# Patient Record
Sex: Male | Born: 1975
Health system: Southern US, Community
[De-identification: ages and names within clinical notes are randomized; demographics above are authoritative.]

## PROBLEM LIST (undated history)

## (undated) DIAGNOSIS — R079 Chest pain, unspecified: Secondary | ICD-10-CM

## (undated) DIAGNOSIS — I4891 Unspecified atrial fibrillation: Secondary | ICD-10-CM

## (undated) DIAGNOSIS — E785 Hyperlipidemia, unspecified: Secondary | ICD-10-CM

## (undated) DIAGNOSIS — N183 Chronic kidney disease, stage 3 unspecified: Secondary | ICD-10-CM

## (undated) HISTORY — PX: TONSILLECTOMY: SUR1361

## (undated) HISTORY — DX: Hyperlipidemia, unspecified: E78.5

## (undated) HISTORY — PX: APPENDECTOMY: SHX54

## (undated) HISTORY — DX: Chest pain, unspecified: R07.9

## (undated) HISTORY — DX: Chronic kidney disease, stage 3 unspecified: N18.30

## (undated) HISTORY — DX: Unspecified atrial fibrillation: I48.91

---

## 2006-11-12 ENCOUNTER — Inpatient Hospital Stay (HOSPITAL_COMMUNITY): Admission: EM | Admit: 2006-11-12 | Discharge: 2006-11-14 | Payer: Self-pay | Admitting: Emergency Medicine

## 2006-11-12 ENCOUNTER — Encounter (INDEPENDENT_AMBULATORY_CARE_PROVIDER_SITE_OTHER): Payer: Self-pay | Admitting: *Deleted

## 2009-11-25 ENCOUNTER — Emergency Department (HOSPITAL_COMMUNITY): Admission: EM | Admit: 2009-11-25 | Discharge: 2009-11-25 | Payer: Self-pay | Admitting: Emergency Medicine

## 2009-11-27 ENCOUNTER — Emergency Department (HOSPITAL_COMMUNITY): Admission: EM | Admit: 2009-11-27 | Discharge: 2009-11-27 | Payer: Self-pay | Admitting: Emergency Medicine

## 2011-02-04 NOTE — H&P (Signed)
NAMEJOVANI, [name]              ACCOUNT NO.:  [id number]   MEDICAL RECORD NO.:  [id number]          PATIENT TYPE:  EMS   LOCATION:  ED                           FACILITY:  [facility]   PHYSICIAN:  Ollen Gross. Vernell Morgans, M.D. DATE OF BIRTH:  1976/06/10   DATE OF ADMISSION:  11/12/2006  DATE OF DISCHARGE:                              HISTORY & PHYSICAL   HISTORY OF PRESENT ILLNESS:  [name] is a 35 year old black male who  presents to the emergency department with right lower quadrant abdominal  pain that started earlier this morning.  He has had one episode of  nausea and vomiting.  As the day has gone on he has not run any fevers.  His pain, though, has gotten worse, to the point where he came to the  emergency department for further evaluation.   REVIEW OF SYSTEMS:  His other review of systems is unremarkable.   PAST MEDICAL HISTORY:  None.   PAST SURGICAL HISTORY:  Significant for tonsillectomy as a child.   MEDICATIONS:  None.   ALLERGIES:  None.   SOCIAL HISTORY:  He smokes cigarettes but denies any alcohol or drug  use.   FAMILY HISTORY:  Noncontributory.   PHYSICAL EXAMINATION:  VITAL SIGNS:  Temperature is 97.9, blood pressure  128/67, pulse is 69.  GENERAL:  He is a young black male in no acute distress.  SKIN:  Warm and dry, no jaundice.  HEENT:  Eyes:  His extraocular muscles are intact.  Pupils equal, round,  and react to light.  Sclerae nonicteric.  LUNGS:  Clear bilaterally with no use of accessory respiratory muscles.  HEART:  Has a regular rate and rhythm with an impulse in the left chest.  ABDOMEN:  Soft with focal right lower quadrant tenderness but no  guarding or peritonitis.  No palpable mass or hepatosplenomegaly.  EXTREMITIES:  No cyanosis, clubbing, or edema with good strength in his  arms and legs.  NEUROLOGIC:  Psychologically he is alert and oriented x3 with no  evidence today of anxiety or depression.   LABORATORY DATA:  On review of his lab work  it was significant for a  white count of 13,000.   CT scan was reviewed with the radiologist and did show an enlarged,  inflamed appendix.   ASSESSMENT/PLAN:  This is a 35 year old black male with what appears to  be acute appendicitis.  Because of the risk of perforation and sepsis, I  think he would benefit from having his appendix removed tonight.  I have  explained to him and his family the risks and benefits of the operation  to remove the appendix as well as some of the technical aspects, and he  understands and wishes to proceed.  We will make arrangements to do this  for him this evening.      Ollen Gross. Vernell Morgans, M.D.  Electronically Signed     PST/MEDQ  D:  11/12/2006  T:  11/12/2006  Job:  [id number]

## 2011-02-04 NOTE — Op Note (Signed)
[name], [name]              ACCOUNT NO.:  [id number]   MEDICAL RECORD NO.:  [id number]          PATIENT TYPE:  OBV   LOCATION:  0098                         FACILITY:  [facility]   PHYSICIAN:  Ollen Gross. Vernell Morgans, M.D. DATE OF BIRTH:  Aug 04, 1976   DATE OF PROCEDURE:  11/12/2006  DATE OF DISCHARGE:                               OPERATIVE REPORT   PREOPERATIVE DIAGNOSIS:  Appendicitis.   POSTOPERATIVE DIAGNOSIS:  Appendicitis.   PROCEDURE:  Laparoscopic appendectomy.   SURGEON:  Ollen Gross. Vernell Morgans, M.D.   ANESTHESIA:  General endotracheal.   PROCEDURE:  After informed consent was obtained, the patient was brought  to the operating room, placed in supine position on the operating table.  After induction of general anesthesia, the patient's abdomen was prepped  with Betadine and draped in usual sterile manner.  The area below the  umbilicus was infiltrated with 4% Marcaine.  A small incision was made  with 15 blade knife.  This incision was carried down through the  subcutaneous tissue bluntly with a hemostat and [employer] retractors  until the linea alba was identified.  The linea alba was incised with a  15 blade knife and each side was grasped with Kocher clamps and elevated  anteriorly.  The preperitoneal space was then probed bluntly with a  hemostat until the peritoneum was opened and access was gained to the  abdominal cavity.  A 0 Vicryl pursestring stitch was placed in the  fascia around the opening.  A Hasson cannula was placed through the  opening and anchored in place with a previously placed Vicryl  pursestring stitch.  The abdomen was then insufflated with carbon  dioxide without difficulty.  The patient was placed in Trendelenburg  position and rotated with the right side up.  The suprapubic area was  then infiltrated with 0.25% Marcaine.  A small incision was made with 15  blade knife and a 10 mm port was placed bluntly through this incision  into the abdominal cavity  under direct vision.  A site was chosen  between these two ports for placement of a 5-mm port.  This area was  infiltrated with 0.25% Marcaine.  A small stab incision was made with 15  blade knife and 5 mm port was placed bluntly through this incision into  the abdominal cavity under direct vision.  The laparoscope was then  moved to the suprapubic port using a Glassman grasper and Harmonic  scalpel.  The right lower quadrant was inspected.  The appendix was  readily identified.  The appendix was then held up in the air towards  the anterior abdominal wall and the mesoappendix was taken down sharply  with the Harmonic scalpel.  Once this dissection was taken down to the  base of the appendix at its junction with the cecum.  A 6TB45 blue load  stapler was placed through the Hasson cannula and across the base the  appendix at its junction with the cecum, clamped and fired thereby  dividing the base of the appendix between staple lines.  A laparoscopic  bag was inserted through the [facility]  cannula, the appendix placed within  the bag and bag was sealed.  The staple line was examined and there was  one small little bleeding vessel on the staple line.  This was  controlled with a clip.  Once this was done the staple line was  hemostatic and healthy and intact.  The abdomen was then irrigated with  copious amounts of saline until the effluent was clear. The abdomen was  generally inspected.  No other abnormalities were noted.  The appendix  and bag were removed with the Hasson cannula through the infraumbilical  port without difficulty.  The fascial defect was closed with the  previously placed Vicryl pursestring stitch as well as with another  figure-of-eight interrupted 0-0 Vicryl stitch.  The rest of ports were  removed under direct vision.  The gas was allowed to escape.  All ports  were hemostatic.  The fascia of the suprapubic port was then closed with  a single interrupted 0-0 Vicryl  stitch.  The skin incisions were all  closed with interrupted 4-0 Monocryl subcuticular stitches.  Benzoin,  Steri-Strips and sterile dressings were applied.  The patient tolerated  well.  At the end of the case all needle, sponge instrument counts  correct.  The patient was awakened  and taken to recovery in stable  condition.      Ollen Gross. Vernell Morgans, M.D.  Electronically Signed     PST/MEDQ  D:  11/12/2006  T:  11/13/2006  Job:  [id number]

## 2011-02-07 ENCOUNTER — Emergency Department (HOSPITAL_COMMUNITY)
Admission: EM | Admit: 2011-02-07 | Discharge: 2011-02-07 | Disposition: A | Discharge status: Home / Self Care | Payer: Self-pay | Attending: Emergency Medicine | Admitting: Emergency Medicine

## 2011-02-07 DIAGNOSIS — R42 Dizziness and giddiness: Secondary | ICD-10-CM | POA: Insufficient documentation

## 2011-02-07 DIAGNOSIS — R5383 Other fatigue: Secondary | ICD-10-CM | POA: Insufficient documentation

## 2011-02-07 DIAGNOSIS — R5381 Other malaise: Secondary | ICD-10-CM | POA: Insufficient documentation

## 2011-02-07 DIAGNOSIS — E119 Type 2 diabetes mellitus without complications: Secondary | ICD-10-CM | POA: Insufficient documentation

## 2011-02-07 LAB — CBC
Hemoglobin: 13.9 g/dL (ref 13.0–17.0)
MCH: 24.1 pg — ABNORMAL LOW (ref 26.0–34.0)
Platelets: 198 10*3/uL (ref 150–400)
RBC: 5.77 MIL/uL (ref 4.22–5.81)
RDW: 12.9 % (ref 11.5–15.5)
WBC: 8.8 10*3/uL (ref 4.0–10.5)

## 2011-02-07 LAB — BASIC METABOLIC PANEL
BUN: 15 mg/dL (ref 6–23)
CO2: 22 mEq/L (ref 19–32)
Calcium: 9.1 mg/dL (ref 8.4–10.5)
GFR calc non Af Amer: >60 mL/min (ref >60)
Glucose, Bld: 440 mg/dL — ABNORMAL HIGH (ref 70–99)
Sodium: 129 mEq/L — ABNORMAL LOW (ref 135–145)

## 2011-02-07 LAB — PROTIME-INR
INR: 0.93 (ref 0.00–1.49)
Prothrombin Time: 12.7 seconds (ref 11.6–15.2)

## 2011-02-07 LAB — DIFFERENTIAL
Basophils Relative: 1 % (ref 0–1)
Eosinophils Absolute: 0.1 10*3/uL (ref 0.0–0.7)
Eosinophils Relative: 1 % (ref 0–5)
Lymphs Abs: 2.2 10*3/uL (ref 0.7–4.0)

## 2011-02-07 LAB — URINALYSIS, ROUTINE W REFLEX MICROSCOPIC
Glucose, UA: >1000 mg/dL — AB
Hgb urine dipstick: NEGATIVE
Ketones, ur: 40 mg/dL — AB
Nitrite: NEGATIVE
Specific Gravity, Urine: 1.043 — ABNORMAL HIGH (ref 1.005–1.030)

## 2011-02-07 LAB — BLOOD GAS, VENOUS
Bicarbonate: 22.3 mEq/L (ref 20.0–24.0)
Patient temperature: 98.6
TCO2: 20.2 mmol/L (ref 0–100)

## 2011-02-07 LAB — CK TOTAL AND CKMB (NOT AT ARMC)
Relative Index: INVALID (ref 0.0–2.5)
Total CK: 76 U/L (ref 7–232)

## 2011-02-12 ENCOUNTER — Encounter: Payer: Self-pay | Admitting: Internal Medicine

## 2011-02-12 ENCOUNTER — Inpatient Hospital Stay (HOSPITAL_COMMUNITY)
Admission: EM | Admit: 2011-02-12 | Discharge: 2011-02-13 | DRG: 639 | Disposition: A | Discharge status: Home / Self Care | Payer: Self-pay | Attending: Internal Medicine | Admitting: Internal Medicine

## 2011-02-12 DIAGNOSIS — E1169 Type 2 diabetes mellitus with other specified complication: Secondary | ICD-10-CM

## 2011-02-12 DIAGNOSIS — E119 Type 2 diabetes mellitus without complications: Principal | ICD-10-CM | POA: Diagnosis present

## 2011-02-12 LAB — DIFFERENTIAL
Basophils Absolute: 0 10*3/uL (ref 0.0–0.1)
Basophils Relative: 1 % (ref 0–1)
Eosinophils Absolute: 0 10*3/uL (ref 0.0–0.7)
Monocytes Absolute: 0.3 10*3/uL (ref 0.1–1.0)
Monocytes Relative: 4 % (ref 3–12)
Neutro Abs: 5.6 10*3/uL (ref 1.7–7.7)
Neutrophils Relative %: 72 % (ref 43–77)

## 2011-02-12 LAB — COMPREHENSIVE METABOLIC PANEL
Alkaline Phosphatase: 96 U/L (ref 39–117)
CO2: 30 mEq/L (ref 19–32)
Calcium: 10.2 mg/dL (ref 8.4–10.5)
Chloride: 89 mEq/L — ABNORMAL LOW (ref 96–112)
Creatinine, Ser: 1.05 mg/dL (ref 0.4–1.5)
GFR calc Af Amer: >60 mL/min (ref >60)
GFR calc non Af Amer: >60 mL/min (ref >60)
Potassium: 4.7 mEq/L (ref 3.5–5.1)
Sodium: 130 mEq/L — ABNORMAL LOW (ref 135–145)
Total Protein: 7.1 g/dL (ref 6.0–8.3)

## 2011-02-12 LAB — URINE MICROSCOPIC-ADD ON

## 2011-02-12 LAB — GLUCOSE, CAPILLARY: Glucose-Capillary: 358 mg/dL — ABNORMAL HIGH (ref 70–99)

## 2011-02-12 LAB — BASIC METABOLIC PANEL
Creatinine, Ser: 1 mg/dL (ref 0.4–1.5)
GFR calc Af Amer: >60 mL/min (ref >60)
Potassium: 3.7 mEq/L (ref 3.5–5.1)
Sodium: 135 mEq/L (ref 135–145)

## 2011-02-12 LAB — CBC
MCH: 24.7 pg — ABNORMAL LOW (ref 26.0–34.0)
MCHC: 33.3 g/dL (ref 30.0–36.0)
Platelets: 191 10*3/uL (ref 150–400)
RBC: 5.27 MIL/uL (ref 4.22–5.81)

## 2011-02-12 LAB — URINALYSIS, ROUTINE W REFLEX MICROSCOPIC
Hgb urine dipstick: NEGATIVE
Nitrite: NEGATIVE
Specific Gravity, Urine: 1.04 — ABNORMAL HIGH (ref 1.005–1.030)
Urobilinogen, UA: 0.2 mg/dL (ref 0.0–1.0)
pH: 6 (ref 5.0–8.0)

## 2011-02-12 LAB — LIPASE, BLOOD: Lipase: 52 U/L (ref 11–59)

## 2011-02-12 NOTE — Progress Notes (Signed)
Hospital Admission Note Date: 02/12/2011  Patient name: [name] Medical record number: [id number] Date of birth: 1976/06/18 Age: 35 y.o. Gender: male PCP: No primary provider on file.  Medical Service: IMTS  Attending physician:Dr. Blanch Media  Pager: Resident (R2/R3):Dr. Baltazar Apo         Pager: [phone] Resident (R1):  Dr. Narda Bonds        Pager: [phone]  Chief Complaint: Fatigue  History of Present Illness: Patient is a 35 y/o man with a history of a recent diagnosis of diabetes on Feb 07, 2011 at the [facility] presenting with persistent fatigue, nausea and vomiting. He had presented there with fatigue, polyuria and polydipsia, found to have a CBG of 480 and was discharged home on Metformin 500mg  BID. Since then, he states he has been experiencing significant nausea and vomiting that is non bloody/nonbilious as well as persistent fatigue. He denies having any fever, chills, sob, cp, palpitations, diaphoresis, abdominal pain, diarrhea/constipation, headache, dizziness or any other systemic symptoms. He has not been around sick contacts.   Outpatient Medications: Metformin 500mg  BID  Allergies: NKDA  PMH: Newly diagnosed DM, Appendicitis  Family History: Sister and all his brothers with diabetes  Social History: Currently lives with his parents in [place]. Works at [employer]. Denies any current alcohol, tobacco or illicit drug use.  Review of Systems: Pertinent items are noted in HPI.  Physical Exam: Vitals t 98.3, p73, rr 16, bp 132/86, o2 sat 100%RA General: Vital signs reviewed and noted. Well-developed, well-nourished, in no acute distress; alert, appropriate and cooperative throughout examination.  Head: Normocephalic, atraumatic.  Ears: TM nonerythematous, not bulging, good light reflex bilaterally.  Nose: Mucous membranes dry, not inflammed, nonerythematous.  Throat: Oropharynx nonerythematous, no exudate appreciated.   Neck: No deformities, masses, or tenderness noted.   Lungs:  Normal respiratory effort. Clear to auscultation BL without crackles or wheezes.  Heart: RRR. S1 and S2 normal without gallop, murmur, or rubs.  Abdomen:  BS normoactive. Soft, Nondistended, non-tender.  No masses or organomegaly.  Extremities: No pretibial edema.   Lab results: CBC:    Component Value Date/Time   WBC 7.8 02/12/2011 1304   HGB 13.0 02/12/2011 1304   HCT 39.0 02/12/2011 1304   PLT 191 02/12/2011 1304   MCV 74.0* 02/12/2011 1304   NEUTROABS 5.6 02/12/2011 1304   LYMPHSABS 1.8 02/12/2011 1304   MONOABS 0.3 02/12/2011 1304   EOSABS 0.0 02/12/2011 1304   BASOSABS 0.0 02/12/2011 1304     Comprehensive Metabolic Panel:    Component Value Date/Time   NA 130* 02/12/2011 1304   K 4.7 02/12/2011 1304   CL 89* 02/12/2011 1304   CO2 30 02/12/2011 1304   BUN 23 02/12/2011 1304   CREATININE 1.05 02/12/2011 1304   GLUCOSE 534* 02/12/2011 1304   CALCIUM 10.2 02/12/2011 1304   AST 16 02/12/2011 1304   ALT 17 02/12/2011 1304   ALKPHOS 96 02/12/2011 1304   BILITOT 0.3 02/12/2011 1304   PROT 7.1 02/12/2011 1304   ALBUMIN 3.8 02/12/2011 1304    Color, Urine                             YELLOW            YELLOW  Appearance                               CLEAR  CLEAR  Specific Gravity                         1.040      h      1.005-1.030  pH                                       6.0               5.0-8.0  Urine Glucose                            >1000      a      NEG              mg/dL  Bilirubin                                NEGATIVE          NEG  Ketones                                  40         a      NEG              mg/dL  Blood                                    NEGATIVE          NEG  Protein                                  NEGATIVE          NEG              mg/dL  Urobilinogen                             0.2               0.0-1.0          mg/dL  Nitrite                                  NEGATIVE          NEG  Leukocytes                               NEGATIVE           NEG  WBC / HPF                                0-2               <3               WBC/hpf  RBC / HPF  0-2               <3               RBC/hpf   Assessment & Plan by Problem:  1. Nausea/Vomiting/fatigue - likely 2/2 poor tolerance of recently started Metformin as well as hyperglycemia from recent diagnosis of DM and dehydration.  - admit for IVF administration given volume depletion  - will stop Metformin and start Glipizide 10mg  qd given significant GI effects. If he needs additional oral antiglycemic, will consider starting Meformin but at 500mg  qd instead of BID and then gradually escalate dose. Also start SSI sensitive with CBG checks AC, HS - Check Hb A1C - Zofran for nausea - Risk stratification - check FLP, lipase to r/o pancreatitis and UDS - Set up outpatient follow up with referral to diabetes educator at the [facility]   2. Dehydration - from hyperglycemia. - IVFs as above - carb modified diet - f/u BUN/Cr  3. DVT ppx - Lovenox

## 2011-02-13 LAB — BASIC METABOLIC PANEL
CO2: 28 mEq/L (ref 19–32)
Calcium: 8.4 mg/dL (ref 8.4–10.5)
Creatinine, Ser: 0.93 mg/dL (ref 0.4–1.5)
Glucose, Bld: 288 mg/dL — ABNORMAL HIGH (ref 70–99)

## 2011-02-13 LAB — URINE DRUGS OF ABUSE SCREEN W ALC, ROUTINE (REF LAB)
Amphetamine Screen, Ur: NEGATIVE
Benzodiazepines.: NEGATIVE
Cocaine Metabolites: NEGATIVE
Creatinine,U: 60.1 mg/dL
Ethyl Alcohol: <10 mg/dL (ref <10)
Opiate Screen, Urine: NEGATIVE

## 2011-02-13 LAB — GLUCOSE, CAPILLARY
Glucose-Capillary: 269 mg/dL — ABNORMAL HIGH (ref 70–99)
Glucose-Capillary: 280 mg/dL — ABNORMAL HIGH (ref 70–99)

## 2011-02-13 LAB — LIPID PANEL
Total CHOL/HDL Ratio: 5.6 RATIO
Triglycerides: 127 mg/dL (ref <150)
VLDL: 25 mg/dL (ref 0–40)

## 2011-02-15 LAB — MISCELLANEOUS TEST

## 2011-02-17 ENCOUNTER — Encounter: Payer: Self-pay | Admitting: Internal Medicine

## 2011-02-17 LAB — GLUCOSE, CAPILLARY: Glucose-Capillary: 553 mg/dL (ref 70–99)

## 2011-02-18 ENCOUNTER — Encounter: Payer: Self-pay | Admitting: Internal Medicine

## 2011-02-18 ENCOUNTER — Telehealth: Payer: Self-pay | Admitting: Internal Medicine

## 2011-02-18 NOTE — Telephone Encounter (Signed)
See below

## 2011-03-16 NOTE — Discharge Summary (Signed)
[name], [name] NO.:  [id number]  MEDICAL RECORD NO.:  [id number]  LOCATION:  5529                         FACILITY:  [facility]  PHYSICIAN:  Blanch Media, M.D.DATE OF BIRTH:  1976/02/10  DATE OF ADMISSION:  02/12/2011 DATE OF DISCHARGE:  02/13/2011                              DISCHARGE SUMMARY   ATTENDING PHYSICIAN:  Blanch Media, MD  DISCHARGE DIAGNOSES: 1. Diabetes mellitus. 2. Dehydration.  DISCHARGE MEDICATIONS: 1. Glipizide 10 mg daily. 2. Zofran 4 mg tablets take 1 tablet q.4 h. p.r.n. for nausea,     vomiting.  DISPOSITION AND FOLLOWUP:  The patient is to follow up at the [facility] for outpatient management of his newly diagnosed diabetes.  Of note it is unclear as to whether the patient does have type 1 versus type 2 diabetes and as such lab tests for evaluation of type 1 diabetes were ordered and these will need to be followed up whenthe patient is seen in the clinic.  PROCEDURES PERFORMED:  None.  CONSULTATIONS:  None.  ADMISSION HISTORY AND PHYSICAL:  The patient is a 35 year old man with a history of recent diagnosis of diabetes on Feb 07, 2011 at the [facility] Emergency Department who presented with persistent fatigue, nausea, and vomiting.  The patient had presented to the [facility] ED with fatigue, polyuria, and polydipsia and found to have a CBG of 480 and so was discharged home on metformin 500 mg b.i.d.  Since then the patient states he had been experiencing significant nausea and vomiting that is nonbloody and nonbilious as well as persistent fatigue.  He denies having any fever, chills, shortness of breath, chest pain, palpitations, diaphoresis, abdominal pain, diarrhea, constipation, headaches, dizziness, or systemic symptoms.  He has also not been around sick contacts.  ADMISSION PHYSICAL EXAMINATION:  VITAL SIGNS:  Temperature 98.3, pulse 73, respirations 16, blood pressure 132/86, oxygen  saturation 100% on room air. GENERAL:  Well-developed, well-nourished in no acute distress. HEENT:  Mucosal membranes dry, not inflamed, and nonerythematous.  No exudate appreciated in the throat. NECK:  Without deformities, masses, or tenderness. LUNGS:  Normal respiratory effort.  Clear to auscultation bilaterally without crackles or wheezes. HEART:  Regular rate and rhythm.  S1 and S2 normal without gallops, murmurs, or rubs. ABDOMEN:  Bowel sounds normoactive, soft, nondistended, and nontender. No masses or organomegaly. EXTREMITIES:  No pretibial edema.  INITIAL LAB DATA:  White blood cell count 7.8, hemoglobin 13, hematocrit 39, platelets 191,000.  Sodium 120, potassium 4.7, chloride 89, bicarb 30, BUN 23, creatinine 1.05, blood glucose 534, AST 16, ALT 17, alk phos 96, total bilirubin 0.3, total protein 7.1, albumin 3.8.  UA showed signs of dehydration.  BRIEF HOSPITAL COURSE:  The patient was admitted to the Inpatient Service for evaluation of his newly diagnosed diabetes as well as for treatment of dehydration.  The patient's nausea, vomiting, and fatigue was likely secondary to poor tolerance of his recently started metformin as well as his hyperglycemia from his recent diagnosis of diabetes as well as dehydration.  The patient was aggressively hydrated given his volume depletion and during his hospitalization, metformin was stopped. He was instead started on glipizide 10  mg daily given the significant GI effects he was experiencing from metformin.  During his hospitalization, the patient's hemoglobin A1C was significantly elevated at 15 and as a result the patient will need to be started on insulin upon his outpatient followup in order to reduce his hemoglobin A1c to acceptable levels.  By day of discharge, the patient was stable and improved with CBGs reasonably controlled on sliding scale insulin.  However, it is unclear as to whether the patient has type 1 or type 2  diabetes and as a result testing for type 1 diabetes was done on day of discharge and the results of these will need to be followed up on an outpatient basis. Regardless, the patient will still need insulin in the meantime to adequately control his CBGs.  DISCHARGE VITAL SIGNS:  Temperature 97.4, pulse 63, respirations 16, blood pressure 110/65, oxygen saturation 98% on room air.  DISCHARGE LAB DATA:  Sodium 134, potassium 3.8, chloride 100, bicarb 28, blood glucose 288, BUN 20, creatinine 0.93.    ______________________________ Jaci Lazier, MD   ______________________________ Blanch Media, M.D.    NI/MEDQ  D:  02/24/2011  T:  02/25/2011  Job:  [id number]  cc:   [facility]  Electronically Signed by Jaci Lazier MD on 03/01/2011 01:40:29 AM Electronically Signed by Blanch Media M.D. on 03/16/2011 11:55:00 AM

## 2013-04-23 ENCOUNTER — Encounter (HOSPITAL_COMMUNITY): Payer: Self-pay | Admitting: *Deleted

## 2013-04-23 ENCOUNTER — Emergency Department (HOSPITAL_COMMUNITY)
Admission: EM | Admit: 2013-04-23 | Discharge: 2013-04-24 | Disposition: A | Discharge status: Home / Self Care | Payer: Self-pay | Attending: Emergency Medicine | Admitting: Emergency Medicine

## 2013-04-23 DIAGNOSIS — E1169 Type 2 diabetes mellitus with other specified complication: Secondary | ICD-10-CM | POA: Insufficient documentation

## 2013-04-23 DIAGNOSIS — R739 Hyperglycemia, unspecified: Secondary | ICD-10-CM

## 2013-04-23 DIAGNOSIS — Z794 Long term (current) use of insulin: Secondary | ICD-10-CM | POA: Insufficient documentation

## 2013-04-23 LAB — COMPREHENSIVE METABOLIC PANEL
ALT: 9 U/L (ref 0–53)
Albumin: 4 g/dL (ref 3.5–5.2)
Alkaline Phosphatase: 90 U/L (ref 39–117)
Calcium: 9.6 mg/dL (ref 8.4–10.5)
GFR calc Af Amer: >90 mL/min (ref >90)
Potassium: 4.3 mEq/L (ref 3.5–5.1)
Sodium: 137 mEq/L (ref 135–145)
Total Protein: 7.5 g/dL (ref 6.0–8.3)

## 2013-04-23 LAB — CBC WITH DIFFERENTIAL/PLATELET
Basophils Absolute: 0 K/uL (ref 0.0–0.1)
Basophils Relative: 1 % (ref 0–1)
Eosinophils Absolute: 0.1 K/uL (ref 0.0–0.7)
Eosinophils Relative: 1 % (ref 0–5)
HCT: 41.4 % (ref 39.0–52.0)
Hemoglobin: 14.1 g/dL (ref 13.0–17.0)
Lymphocytes Relative: 31 % (ref 12–46)
Lymphs Abs: 1.8 K/uL (ref 0.7–4.0)
MCH: 26.1 pg (ref 26.0–34.0)
MCHC: 34.1 g/dL (ref 30.0–36.0)
MCV: 76.5 fL — ABNORMAL LOW (ref 78.0–100.0)
Monocytes Absolute: 0.5 K/uL (ref 0.1–1.0)
Monocytes Relative: 8 % (ref 3–12)
Neutro Abs: 3.6 K/uL (ref 1.7–7.7)
Neutrophils Relative %: 60 % (ref 43–77)
Platelets: 204 K/uL (ref 150–400)
RBC: 5.41 MIL/uL (ref 4.22–5.81)
RDW: 13.8 % (ref 11.5–15.5)
WBC: 6 K/uL (ref 4.0–10.5)

## 2013-04-23 LAB — GLUCOSE, CAPILLARY: Glucose-Capillary: 368 mg/dL — ABNORMAL HIGH (ref 70–99)

## 2013-04-23 NOTE — ED Notes (Signed)
Pt in c/o generalized weakness and feeling tired over the last two or three days, states his CBG has been between 150-300 but that is not new for him, c/o pain in bilateral ankles, denies other problems at this time

## 2013-04-23 NOTE — ED Notes (Signed)
CBG is 384. Notified Nurse Irving Burton.

## 2013-04-24 NOTE — ED Notes (Signed)
MD at bedside. 

## 2013-04-24 NOTE — ED Provider Notes (Signed)
  CSN: [id number]     Arrival date & time 04/23/13  1929 History     First MD Initiated Contact with Patient 04/24/13 0003     Chief Complaint  Patient presents with  . Weakness   (Consider location/radiation/quality/duration/timing/severity/associated sxs/prior Treatment) HPI Few days general weakness/fatigue, no other Sxs, no fever no confusion no chest pain no shortness breath no abdominal pain no vomiting no dysuria no focal neurologic symptoms.   Past Medical History  Diagnosis Date  . Diabetes mellitus     Newly diagnosed, May 2012   Past Surgical History  Procedure Laterality Date  . Appendectomy     Family History  Problem Relation Age of Onset  . Diabetes Sister     Several brothers and sister with DM  . Diabetes Brother    History  Substance Use Topics  . Smoking status: Not on file  . Smokeless tobacco: Not on file  . Alcohol Use: Not on file    Review of Systems 10 Systems reviewed and are negative for acute change except as noted in the HPI. Allergies  Review of patient's allergies indicates no known allergies.  Home Medications   Current Outpatient Rx  Name  Route  Sig  Dispense  Refill  . insulin aspart protamine- aspart (NOVOLOG MIX 70/30) (70-30) 100 UNIT/ML injection   Subcutaneous   Inject 5-10 Units into the skin 2 (two) times daily with a meal. Per sliding scale          BP 139/94  Pulse 71  Temp(Src) 98.4 F (36.9 C) (Oral)  Resp 15  Wt 160 lb (72.576 kg)  SpO2 98% Physical Exam  Nursing note and vitals reviewed. Constitutional:  Awake, alert, nontoxic appearance.  HENT:  Head: Atraumatic.  Eyes: Right eye exhibits no discharge. Left eye exhibits no discharge.  Neck: Neck supple.  Cardiovascular: Normal rate and regular rhythm.   No murmur heard. Pulmonary/Chest: Effort normal and breath sounds normal. No respiratory distress. He has no wheezes. He has no rales. He exhibits no tenderness.  Abdominal: Soft. Bowel sounds are  normal. He exhibits no distension. There is no tenderness. There is no rebound and no guarding.  Musculoskeletal: He exhibits no edema and no tenderness.  Baseline ROM, no obvious new focal weakness.  Neurological: He is alert.  Mental status and motor strength appears baseline for patient and situation.  Skin: No rash noted.  Psychiatric: He has a normal mood and affect.    ED Course   Procedures (including critical care time)  Labs Reviewed  CBC WITH DIFFERENTIAL - Abnormal; Notable for the following:    MCV 76.5 (*)    All other components within normal limits  COMPREHENSIVE METABOLIC PANEL - Abnormal; Notable for the following:    Glucose, Bld 405 (*)    GFR calc non Af Amer 87 (*)    All other components within normal limits  GLUCOSE, CAPILLARY - Abnormal; Notable for the following:    Glucose-Capillary 384 (*)    All other components within normal limits  GLUCOSE, CAPILLARY - Abnormal; Notable for the following:    Glucose-Capillary 368 (*)    All other components within normal limits   No results found. 1. Hyperglycemia without ketosis     MDM  I doubt any other EMC precluding discharge at this time including, but not necessarily limited to the following:DKA.  Hurman Horn, MD 04/24/13 [phone]

## 2013-08-04 ENCOUNTER — Emergency Department (HOSPITAL_COMMUNITY)
Admission: EM | Admit: 2013-08-04 | Discharge: 2013-08-04 | Disposition: A | Discharge status: Home / Self Care | Payer: Self-pay | Attending: Emergency Medicine | Admitting: Emergency Medicine

## 2013-08-04 ENCOUNTER — Encounter (HOSPITAL_COMMUNITY): Payer: Self-pay | Admitting: Emergency Medicine

## 2013-08-04 ENCOUNTER — Emergency Department (HOSPITAL_COMMUNITY): Payer: Self-pay

## 2013-08-04 DIAGNOSIS — R071 Chest pain on breathing: Secondary | ICD-10-CM | POA: Insufficient documentation

## 2013-08-04 DIAGNOSIS — F17200 Nicotine dependence, unspecified, uncomplicated: Secondary | ICD-10-CM | POA: Insufficient documentation

## 2013-08-04 DIAGNOSIS — R0789 Other chest pain: Secondary | ICD-10-CM

## 2013-08-04 DIAGNOSIS — Z794 Long term (current) use of insulin: Secondary | ICD-10-CM | POA: Insufficient documentation

## 2013-08-04 DIAGNOSIS — E119 Type 2 diabetes mellitus without complications: Secondary | ICD-10-CM | POA: Insufficient documentation

## 2013-08-04 LAB — CBC
HCT: 42.6 % (ref 39.0–52.0)
MCHC: 34.5 g/dL (ref 30.0–36.0)
MCV: 76.6 fL — ABNORMAL LOW (ref 78.0–100.0)
Platelets: 214 10*3/uL (ref 150–400)
RDW: 14 % (ref 11.5–15.5)
WBC: 8.4 10*3/uL (ref 4.0–10.5)

## 2013-08-04 LAB — BASIC METABOLIC PANEL
BUN: 13 mg/dL (ref 6–23)
Chloride: 99 mEq/L (ref 96–112)
Creatinine, Ser: 1 mg/dL (ref 0.50–1.35)
GFR calc Af Amer: >90 mL/min (ref >90)
GFR calc non Af Amer: >90 mL/min (ref >90)
Potassium: 4.1 mEq/L (ref 3.5–5.1)

## 2013-08-04 LAB — TROPONIN I: Troponin I: <0.3 ng/mL (ref <0.30)

## 2013-08-04 LAB — GLUCOSE, CAPILLARY: Glucose-Capillary: 208 mg/dL — ABNORMAL HIGH (ref 70–99)

## 2013-08-04 MED ORDER — SODIUM CHLORIDE 0.9 % IV BOLUS (SEPSIS)
1000.0000 mL | Freq: Once | INTRAVENOUS | Status: AC
  Administered 2013-08-04: 1000 mL via INTRAVENOUS

## 2013-08-04 MED ORDER — NAPROXEN 500 MG PO TABS: 500.0000 mg | ORAL_TABLET | Freq: Two times a day (BID) | ORAL | Status: DC

## 2013-08-04 MED ORDER — KETOROLAC TROMETHAMINE 30 MG/ML IJ SOLN
30.0000 mg | Freq: Once | INTRAMUSCULAR | Status: AC
  Administered 2013-08-04: 30 mg via INTRAVENOUS
  Filled 2013-08-04: qty 1

## 2013-08-04 NOTE — ED Provider Notes (Signed)
CSN: [id number]     Arrival date & time 08/04/13  1021 History   First MD Initiated Contact with Patient 08/04/13 1110     Chief Complaint  Patient presents with  . Chest Pain   (Consider location/radiation/quality/duration/timing/severity/associated sxs/prior Treatment) Patient is a 37 y.o. male presenting with chest pain.  Chest Pain  Pt with history of DM on insulin reports gradually worsening, sharp R sided chest pain since last night, worse with deep breath and movement. No cough, no fever. No similar symptoms. Denies leg swelling or travel. He is PERC neg.   Past Medical History  Diagnosis Date  . Diabetes mellitus     Newly diagnosed, May 2012   Past Surgical History  Procedure Laterality Date  . Appendectomy     Family History  Problem Relation Age of Onset  . Diabetes Sister     Several brothers and sister with DM  . Diabetes Brother    History  Substance Use Topics  . Smoking status: Current Every Day Smoker  . Smokeless tobacco: Not on file  . Alcohol Use: No    Review of Systems  Cardiovascular: Positive for chest pain.   All other systems reviewed and are negative except as noted in HPI.   Allergies  Review of patient's allergies indicates no known allergies.  Home Medications   Current Outpatient Rx  Name  Route  Sig  Dispense  Refill  . insulin aspart protamine- aspart (NOVOLOG MIX 70/30) (70-30) 100 UNIT/ML injection   Subcutaneous   Inject 5-10 Units into the skin 2 (two) times daily with a meal. Per sliding scale          BP 139/75  Pulse 62  Resp 20  SpO2 98% Physical Exam  Nursing note and vitals reviewed. Constitutional: He is oriented to person, place, and time. He appears well-developed and well-nourished.  HENT:  Head: Normocephalic and atraumatic.  Eyes: EOM are normal. Pupils are equal, round, and reactive to light.  Neck: Normal range of motion. Neck supple.  Cardiovascular: Normal rate, normal heart sounds and intact distal  pulses.   Pulmonary/Chest: Effort normal and breath sounds normal. He has no wheezes. He has no rales. He exhibits tenderness (R sided).  Abdominal: Bowel sounds are normal. He exhibits no distension. There is no tenderness.  Musculoskeletal: Normal range of motion. He exhibits no edema and no tenderness.  Neurological: He is alert and oriented to person, place, and time. He has normal strength. No cranial nerve deficit or sensory deficit.  Skin: Skin is warm and dry. No rash noted.  Psychiatric: He has a normal mood and affect.    ED Course  Procedures (including critical care time) Labs Review Labs Reviewed  GLUCOSE, CAPILLARY - Abnormal; Notable for the following:    Glucose-Capillary 208 (*)    All other components within normal limits  CBC - Abnormal; Notable for the following:    MCV 76.6 (*)    All other components within normal limits  BASIC METABOLIC PANEL - Abnormal; Notable for the following:    Glucose, Bld 275 (*)    All other components within normal limits  TROPONIN I   Imaging Review Dg Chest 2 View (if Patient Has Fever And/or Copd)  08/04/2013   CLINICAL DATA:  Stabbing right-sided chest pain and shortness of breath.  EXAM: CHEST  2 VIEW  COMPARISON:  None.  FINDINGS: The heart size and mediastinal contours are within normal limits. Both lungs are clear. The visualized skeletal  structures are unremarkable.  IMPRESSION: No active cardiopulmonary disease.   Electronically Signed   By: Elige Ko   On: 08/04/2013 11:19    EKG Interpretation     Ventricular Rate:  90 PR Interval:  160 QRS Duration: 84 QT Interval:  390 QTC Calculation: 477 R Axis:   102 Text Interpretation:  Normal sinus rhythm with sinus arrhythmia Rightward axis Anterior infarct , age undetermined Abnormal ECG No significant change since last tracing            MDM   1. Chest wall pain     Pt sleeping soundly in room after toradol. MSK chest pain, no concern for ACS or PE. No  signs of PTX on CXR. Plan discharge with NSAID and PCP followup. Note that he did not take his morning insulin today because he was coming to the ED to be evaluated. He has mild hyperglycemia but no signs of acidosis.     Adessa Primiano B. Bernette Mayers, MD 08/04/13 [phone]

## 2013-08-04 NOTE — ED Notes (Signed)
Pt reports right sided chest pain that radiates to his back. Pt c/o shortness of breath and nausea. Pt has history of diabetes. Pt c/o numbness tingling to legs.

## 2014-03-29 ENCOUNTER — Encounter (HOSPITAL_COMMUNITY): Payer: Self-pay | Admitting: Emergency Medicine

## 2014-03-29 ENCOUNTER — Emergency Department (HOSPITAL_COMMUNITY): Payer: Self-pay

## 2014-03-29 ENCOUNTER — Emergency Department (HOSPITAL_COMMUNITY)
Admission: EM | Admit: 2014-03-29 | Discharge: 2014-03-29 | Disposition: A | Discharge status: Home / Self Care | Payer: Self-pay | Attending: Emergency Medicine | Admitting: Emergency Medicine

## 2014-03-29 DIAGNOSIS — F17200 Nicotine dependence, unspecified, uncomplicated: Secondary | ICD-10-CM | POA: Insufficient documentation

## 2014-03-29 DIAGNOSIS — Z794 Long term (current) use of insulin: Secondary | ICD-10-CM | POA: Insufficient documentation

## 2014-03-29 DIAGNOSIS — E119 Type 2 diabetes mellitus without complications: Secondary | ICD-10-CM | POA: Insufficient documentation

## 2014-03-29 DIAGNOSIS — R739 Hyperglycemia, unspecified: Secondary | ICD-10-CM

## 2014-03-29 DIAGNOSIS — R5383 Other fatigue: Secondary | ICD-10-CM

## 2014-03-29 DIAGNOSIS — R5381 Other malaise: Secondary | ICD-10-CM | POA: Insufficient documentation

## 2014-03-29 DIAGNOSIS — R112 Nausea with vomiting, unspecified: Secondary | ICD-10-CM | POA: Insufficient documentation

## 2014-03-29 LAB — I-STAT TROPONIN, ED: Troponin i, poc: 0 ng/mL (ref 0.00–0.08)

## 2014-03-29 LAB — URINALYSIS, ROUTINE W REFLEX MICROSCOPIC
Bilirubin Urine: NEGATIVE
Glucose, UA: >1000 mg/dL — AB
Hgb urine dipstick: NEGATIVE
Ketones, ur: >80 mg/dL — AB
LEUKOCYTES UA: NEGATIVE
Nitrite: NEGATIVE
PH: 5.5 (ref 5.0–8.0)
Protein, ur: NEGATIVE mg/dL
Specific Gravity, Urine: >1.046 — ABNORMAL HIGH (ref 1.005–1.030)
Urobilinogen, UA: 0.2 mg/dL (ref 0.0–1.0)

## 2014-03-29 LAB — CBG MONITORING, ED
GLUCOSE-CAPILLARY: 351 mg/dL — AB (ref 70–99)
Glucose-Capillary: 290 mg/dL — ABNORMAL HIGH (ref 70–99)

## 2014-03-29 LAB — CBC
HCT: 39.5 % (ref 39.0–52.0)
Hemoglobin: 12.8 g/dL — ABNORMAL LOW (ref 13.0–17.0)
MCH: 24.7 pg — ABNORMAL LOW (ref 26.0–34.0)
MCHC: 32.4 g/dL (ref 30.0–36.0)
MCV: 76.1 fL — ABNORMAL LOW (ref 78.0–100.0)
PLATELETS: 202 10*3/uL (ref 150–400)
RBC: 5.19 MIL/uL (ref 4.22–5.81)
RDW: 13.2 % (ref 11.5–15.5)
WBC: 6.4 10*3/uL (ref 4.0–10.5)

## 2014-03-29 LAB — COMPREHENSIVE METABOLIC PANEL
ALBUMIN: 4 g/dL (ref 3.5–5.2)
ALK PHOS: 86 U/L (ref 39–117)
ALT: 12 U/L (ref 0–53)
AST: 12 U/L (ref 0–37)
Anion gap: 20 — ABNORMAL HIGH (ref 5–15)
BUN: 14 mg/dL (ref 6–23)
CHLORIDE: 97 meq/L (ref 96–112)
CO2: 22 mEq/L (ref 19–32)
Calcium: 9.2 mg/dL (ref 8.4–10.5)
Creatinine, Ser: 0.93 mg/dL (ref 0.50–1.35)
GFR calc Af Amer: >90 mL/min (ref >90)
GFR calc non Af Amer: >90 mL/min (ref >90)
Glucose, Bld: 386 mg/dL — ABNORMAL HIGH (ref 70–99)
POTASSIUM: 3.9 meq/L (ref 3.7–5.3)
SODIUM: 139 meq/L (ref 137–147)
Total Bilirubin: 0.4 mg/dL (ref 0.3–1.2)
Total Protein: 7.3 g/dL (ref 6.0–8.3)

## 2014-03-29 LAB — URINE MICROSCOPIC-ADD ON

## 2014-03-29 MED ORDER — SODIUM CHLORIDE 0.9 % IV BOLUS (SEPSIS)
1000.0000 mL | Freq: Once | INTRAVENOUS | Status: AC
  Administered 2014-03-29: 1000 mL via INTRAVENOUS

## 2014-03-29 MED ORDER — SODIUM CHLORIDE 0.9 % IV BOLUS (SEPSIS): 1000.0000 mL | Freq: Once | INTRAVENOUS | Status: DC

## 2014-03-29 MED ORDER — IBUPROFEN 800 MG PO TABS
800.0000 mg | ORAL_TABLET | Freq: Once | ORAL | Status: AC
  Administered 2014-03-29: 800 mg via ORAL
  Filled 2014-03-29: qty 1

## 2014-03-29 NOTE — ED Notes (Signed)
Patient transported to X-ray 

## 2014-03-29 NOTE — Discharge Instructions (Signed)

## 2014-03-29 NOTE — ED Provider Notes (Signed)
CSN: [id number]     Arrival date & time 03/29/14  [id number] History   First MD Initiated Contact with Patient 03/29/14 [phone]     Chief Complaint  Patient presents with  . Hypoglycemia  . Generalized Body Aches     (Consider location/radiation/quality/duration/timing/severity/associated sxs/prior Treatment) Patient is a 38 y.o. male presenting with general illness.  Illness Location:  Generalized Quality:  Malaise, arthralgias Severity:  Moderate Onset quality:  Sudden Timing:  Constant Progression:  Worsening Chronicity:  New Context:  Woke up this morning and began feeling bad. Did not miss any doses of insulin Relieved by:  Nothing Worsened by:  Nothing Associated symptoms: nausea   Associated symptoms: no abdominal pain, no chest pain, no cough and no fever     Past Medical History  Diagnosis Date  . Diabetes mellitus     Newly diagnosed, May 2012   Past Surgical History  Procedure Laterality Date  . Appendectomy     Family History  Problem Relation Age of Onset  . Diabetes Sister     Several brothers and sister with DM  . Diabetes Brother    History  Substance Use Topics  . Smoking status: Current Every Day Smoker  . Smokeless tobacco: Not on file  . Alcohol Use: No    Review of Systems  Constitutional: Negative for fever and chills.  Respiratory: Negative for cough.   Cardiovascular: Negative for chest pain.  Gastrointestinal: Positive for nausea. Negative for abdominal pain.  Musculoskeletal: Positive for arthralgias.  All other systems reviewed and are negative.     Allergies  Review of patient's allergies indicates no known allergies.  Home Medications   Prior to Admission medications   Medication Sig Start Date End Date Taking? Authorizing Provider  insulin aspart protamine- aspart (NOVOLOG MIX 70/30) (70-30) 100 UNIT/ML injection Inject 10-15 Units into the skin 2 (two) times daily with a meal. Per sliding scale   Yes Historical Provider, MD    BP 119/84  Pulse 75  Temp(Src) 98.7 F (37.1 C) (Oral)  Resp 15  Ht 6' (1.829 m)  Wt 150 lb (68.04 kg)  BMI 20.34 kg/m2  SpO2 100% Physical Exam  Constitutional: He is oriented to person, place, and time. He appears well-developed and well-nourished. No distress.  HENT:  Head: Normocephalic and atraumatic.  Mouth/Throat: Oropharynx is clear and moist. No oropharyngeal exudate.  Eyes: EOM are normal. Pupils are equal, round, and reactive to light.  Neck: Normal range of motion. Neck supple.  Cardiovascular: Normal rate and regular rhythm.  Exam reveals no friction rub.   No murmur heard. Pulmonary/Chest: Effort normal and breath sounds normal. No respiratory distress. He has no wheezes. He has no rales.  Abdominal: He exhibits no distension. There is no tenderness. There is no rebound.  Musculoskeletal: Normal range of motion. He exhibits no edema.  Neurological: He is alert and oriented to person, place, and time.  Skin: He is not diaphoretic.    ED Course  Procedures (including critical care time) Labs Review Labs Reviewed  CBC - Abnormal; Notable for the following:    Hemoglobin 12.8 (*)    MCV 76.1 (*)    MCH 24.7 (*)    All other components within normal limits  CBG MONITORING, ED - Abnormal; Notable for the following:    Glucose-Capillary 351 (*)    All other components within normal limits  COMPREHENSIVE METABOLIC PANEL  URINALYSIS, ROUTINE W REFLEX MICROSCOPIC  CBG MONITORING, ED    Imaging Review  Dg Chest 2 View  03/29/2014   CLINICAL DATA:  Chest pain  EXAM: CHEST  2 VIEW  COMPARISON:  08/04/2013  FINDINGS: Cardiac shadow is stable. The lungs are hyperinflated consistent with COPD. No focal infiltrate or sizable effusion is seen. No bony abnormality is noted.  IMPRESSION: COPD without acute abnormality.   Electronically Signed   By: Alcide Clever M.D.   On: 03/29/2014 11:32     EKG Interpretation   Date/Time:  Saturday March 29 2014 10:30:13  EDT Ventricular Rate:  59 PR Interval:  177 QRS Duration: 83 QT Interval:  403 QTC Calculation: 399 R Axis:   88 Text Interpretation:  Sinus rhythm Anteroseptal infarct, age  indeterminate, similar to prior Lateral leads are also involved Confirmed  by Gwendolyn Grant  MD, Cooper Stamp ([id number]) on 03/29/2014 10:36:40 AM      MDM   Final diagnoses:  Malaise  Hyperglycemia    89M diabetic presents with general malaise. Began today. Went to sleep feeling well. Hasn't missed any doses of insulin - takes 70/30. States joints aching, nausea, vomiting x1, fatigue. Denies CP, SOB, cough, dysuria.  Here sugars elevated, exam benign. No signs of infection. Will check labs.  Labs ok. Given 2 L of fluid, feeling mildly better. No PNA or UTI. No signs of DKA. Patient stable for discharge.   Dagmar Hait, MD 03/29/14 [phone]

## 2014-03-29 NOTE — ED Notes (Signed)
Pt reports he is diabetic, complains of joint pain, sts his sugar is dropping,initially was 400+ and going down now

## 2014-04-02 NOTE — ED Notes (Signed)
[facility] Community Liaison was not able to see the patient, [facility] orange card information and resources will be sent to the address provided.

## 2014-04-08 ENCOUNTER — Ambulatory Visit: Payer: Self-pay | Attending: Internal Medicine | Admitting: Internal Medicine

## 2014-04-08 ENCOUNTER — Encounter: Payer: Self-pay | Admitting: Internal Medicine

## 2014-04-08 VITALS — BP 113/79 | HR 83 | Temp 98.5°F | Resp 16 | Wt 133.8 lb

## 2014-04-08 DIAGNOSIS — Z139 Encounter for screening, unspecified: Secondary | ICD-10-CM

## 2014-04-08 DIAGNOSIS — F17200 Nicotine dependence, unspecified, uncomplicated: Secondary | ICD-10-CM | POA: Insufficient documentation

## 2014-04-08 DIAGNOSIS — E089 Diabetes mellitus due to underlying condition without complications: Secondary | ICD-10-CM | POA: Insufficient documentation

## 2014-04-08 DIAGNOSIS — Z87891 Personal history of nicotine dependence: Secondary | ICD-10-CM | POA: Insufficient documentation

## 2014-04-08 DIAGNOSIS — E139 Other specified diabetes mellitus without complications: Secondary | ICD-10-CM

## 2014-04-08 DIAGNOSIS — E119 Type 2 diabetes mellitus without complications: Secondary | ICD-10-CM | POA: Insufficient documentation

## 2014-04-08 LAB — POCT URINALYSIS DIPSTICK
Bilirubin, UA: NEGATIVE
Glucose, UA: 500
LEUKOCYTES UA: NEGATIVE
NITRITE UA: NEGATIVE
PH UA: 5.5
RBC UA: NEGATIVE
SPEC GRAV UA: 1.02
Urobilinogen, UA: 0.2

## 2014-04-08 LAB — GLUCOSE, POCT (MANUAL RESULT ENTRY): POC Glucose: 311 mg/dl — AB (ref 70–99)

## 2014-04-08 LAB — POCT GLYCOSYLATED HEMOGLOBIN (HGB A1C): Hemoglobin A1C: 14

## 2014-04-08 MED ORDER — INSULIN ASPART PROT & ASPART (70-30 MIX) 100 UNIT/ML ~~LOC~~ SUSP: 10.0000 [IU] | Freq: Two times a day (BID) | SUBCUTANEOUS | Status: DC

## 2014-04-08 MED ORDER — GLUCOSE BLOOD VI STRP
ORAL_STRIP | Status: DC
Start: 2014-04-08 — End: 2015-12-16

## 2014-04-08 MED ORDER — INSULIN ASPART 100 UNIT/ML ~~LOC~~ SOLN
10.0000 [IU] | Freq: Once | SUBCUTANEOUS | Status: AC
  Administered 2014-04-08: 10 [IU] via SUBCUTANEOUS

## 2014-04-08 MED ORDER — "INSULIN SYRINGE 29G X 1/2"" 0.3 ML MISC": Status: DC

## 2014-04-08 NOTE — Progress Notes (Signed)
Patient here to establish care Currently on insulin for his diabetes Presents in office today with elevated blood sugar

## 2014-04-08 NOTE — Progress Notes (Signed)
Patient Demographics  [name], is a 38 y.o. male  ZOX:[id number]  [id number]  DOB - 02/24/1976  CC:  Chief Complaint  Patient presents with  . Establish Care       HPI: [name] is a 38 y.o. male here today to establish medical care. History of diabetes for the last 3 years as per patient is taking NovoLog 70/30 as per sliding scale has been taking 8 units, he also recently went to the emergency room with symptoms of myalgia fatigue, was treated for hyperglycemia, currently patient denies any polyuria polydipsia and blurry vision, he took 8 units of NovoLog today and already ate before he came for the office visit currently his blood sugar is elevated patient denies any symptoms. Patient has No headache, No chest pain, No abdominal pain - No Nausea, No new weakness tingling or numbness, No Cough - SOB.  No Known Allergies Past Medical History  Diagnosis Date  . Diabetes mellitus     Newly diagnosed, May 2012   No current outpatient prescriptions on file prior to visit.   No current facility-administered medications on file prior to visit.   Family History  Problem Relation Age of Onset  . Diabetes Sister     Several brothers and sister with DM  . Diabetes Brother   . Hypertension Mother    History   Social History  . Marital Status: Single    Spouse Name: N/A    Number of Children: N/A  . Years of Education: N/A   Occupational History  . Not on file.   Social History Main Topics  . Smoking status: Current Every Day Smoker  . Smokeless tobacco: Not on file  . Alcohol Use: No  . Drug Use: Yes    Special: Marijuana     Comment: last use was yesterday   . Sexual Activity: Not on file   Other Topics Concern  . Not on file   Social History Narrative   [place] Works at [employer]. Denies any current alcohol, tobacco or illicit drug use.          Review of Systems: Constitutional: Negative for  fever, chills, diaphoresis, activity change, appetite change and fatigue. HENT: Negative for ear pain, nosebleeds, congestion, facial swelling, rhinorrhea, neck pain, neck stiffness and ear discharge.  Eyes: Negative for pain, discharge, redness, itching and visual disturbance. Respiratory: Negative for cough, choking, chest tightness, shortness of breath, wheezing and stridor.  Cardiovascular: Negative for chest pain, palpitations and leg swelling. Gastrointestinal: Negative for abdominal distention. Genitourinary: Negative for dysuria, urgency, frequency, hematuria, flank pain, decreased urine volume, difficulty urinating and dyspareunia.  Musculoskeletal: Negative for back pain, joint swelling, arthralgia and gait problem. Neurological: Negative for dizziness, tremors, seizures, syncope, facial asymmetry, speech difficulty, weakness, light-headedness, numbness and headaches.  Hematological: Negative for adenopathy. Does not bruise/bleed easily. Psychiatric/Behavioral: Negative for hallucinations, behavioral problems, confusion, dysphoric mood, decreased concentration and agitation.    Objective:   Filed Vitals:   04/08/14 1451  BP: 113/79  Pulse: 83  Temp: 98.5 F (36.9 C)  Resp: 16    Physical Exam: Constitutional: Patient appears well-developed and well-nourished. No distress. HENT: Normocephalic, atraumatic, External right and left ear normal. Oropharynx is clear and moist.  Eyes: Conjunctivae and EOM are normal. PERRLA, no scleral icterus. Neck: Normal ROM. Neck supple. No JVD. No tracheal deviation. No thyromegaly. CVS: RRR, S1/S2 +, no murmurs, no gallops, no carotid bruit.  Pulmonary: Effort  and breath sounds normal, no stridor, rhonchi, wheezes, rales.  Abdominal: Soft. BS +, no distension, tenderness, rebound or guarding.  Musculoskeletal: Normal range of motion. No edema and no tenderness.  Neuro: Alert. Normal reflexes, muscle tone coordination. No cranial nerve  deficit. Skin: Skin is warm and dry. No rash noted. Not diaphoretic. No erythema. No pallor. Psychiatric: Normal mood and affect. Behavior, judgment, thought content normal.  Lab Results  Component Value Date   WBC 6.4 03/29/2014   HGB 12.8* 03/29/2014   HCT 39.5 03/29/2014   MCV 76.1* 03/29/2014   PLT 202 03/29/2014   Lab Results  Component Value Date   CREATININE 0.93 03/29/2014   BUN 14 03/29/2014   NA 139 03/29/2014   K 3.9 03/29/2014   CL 97 03/29/2014   CO2 22 03/29/2014    Lab Results  Component Value Date   HGBA1C 14% 04/08/2014   Lipid Panel     Component Value Date/Time   CHOL 168 02/13/2011 0500   TRIG 127 02/13/2011 0500   HDL 30* 02/13/2011 0500   CHOLHDL 5.6 02/13/2011 0500   VLDL 25 02/13/2011 0500   LDLCALC  Value: 113        Total Cholesterol/HDL:CHD Risk Coronary Heart Disease Risk Table                     Men   Women  1/2 Average Risk   3.4   3.3  Average Risk       5.0   4.4  2 X Average Risk   9.6   7.1  3 X Average Risk  23.4   11.0        Use the calculated Patient Ratio above and the CHD Risk Table to determine the patient's CHD Risk.        ATP III CLASSIFICATION (LDL):  <100     mg/dL   Optimal  161-096  mg/dL   Near or Above                    Optimal  130-159  mg/dL   Borderline  045-409  mg/dL   High  >811     mg/dL   Very High* 06/02/7828 0500       Assessment and plan:   1. Diabetes mellitus due to underlying condition without complications Results for orders placed in visit on 04/08/14  GLUCOSE, POCT (MANUAL RESULT ENTRY)      Result Value Ref Range   POC Glucose 311 (*) 70 - 99 mg/dl  POCT GLYCOSYLATED HEMOGLOBIN (HGB A1C)      Result Value Ref Range   Hemoglobin A1C 14%    POCT URINALYSIS DIPSTICK      Result Value Ref Range   Color, UA yellow     Clarity, UA clear     Glucose, UA 500     Bilirubin, UA neg     Ketones, UA trace     Spec Grav, UA 1.020     Blood, UA neg     pH, UA 5.5     Protein, UA trace     Urobilinogen, UA 0.2      Nitrite, UA neg     Leukocytes, UA Negative     Patient was given insulin NovoLog 10 units, also advise patient to increase the dose of NovoLog mix to 12 units 2 times and keep the fingerstick log - COMPLETE METABOLIC PANEL WITH GFR; Future - Ambulatory referral to Ophthalmology -  Ambulatory referral to Endocrinology - Glucose (CBG) - HgB A1c - insulin aspart (novoLOG) injection 10 Units; Inject 0.1 mLs (10 Units total) into the skin once.  2. Smoking Advised patient to quit smoking.  3. Screening We will do baseline fasting blood work.  - CBC with Differential; Future - COMPLETE METABOLIC PANEL WITH GFR; Future - Lipid panel; Future - TSH; Future - Vit D  25 hydroxy (rtn osteoporosis monitoring); Future  Return in about 3 months (around 07/09/2014) for diabetes, CBG check in 4 weeks/Nurse Visit.    Doris Cheadle, MD

## 2014-04-08 NOTE — Patient Instructions (Signed)
Diabetes Mellitus and Food It is important for you to manage your blood sugar (glucose) level. Your blood glucose level can be greatly affected by what you eat. Eating healthier foods in the appropriate amounts throughout the day at about the same time each day will help you control your blood glucose level. It can also help slow or prevent worsening of your diabetes mellitus. Healthy eating may even help you improve the level of your blood pressure and reach or maintain a healthy weight.  HOW CAN FOOD AFFECT ME? Carbohydrates Carbohydrates affect your blood glucose level more than any other type of food. Your dietitian will help you determine how many carbohydrates to eat at each meal and teach you how to count carbohydrates. Counting carbohydrates is important to keep your blood glucose at a healthy level, especially if you are using insulin or taking certain medicines for diabetes mellitus. Alcohol Alcohol can cause sudden decreases in blood glucose (hypoglycemia), especially if you use insulin or take certain medicines for diabetes mellitus. Hypoglycemia can be a life-threatening condition. Symptoms of hypoglycemia (sleepiness, dizziness, and disorientation) are similar to symptoms of having too much alcohol.  If your health care provider has given you approval to drink alcohol, do so in moderation and use the following guidelines:  Women should not have more than one drink per day, and men should not have more than two drinks per day. One drink is equal to:  12 oz of beer.  5 oz of wine.  1 oz of hard liquor.  Do not drink on an empty stomach.  Keep yourself hydrated. Have water, diet soda, or unsweetened iced tea.  Regular soda, juice, and other mixers might contain a lot of carbohydrates and should be counted. WHAT FOODS ARE NOT RECOMMENDED? As you make food choices, it is important to remember that all foods are not the same. Some foods have fewer nutrients per serving than other  foods, even though they might have the same number of calories or carbohydrates. It is difficult to get your body what it needs when you eat foods with fewer nutrients. Examples of foods that you should avoid that are high in calories and carbohydrates but low in nutrients include:  Trans fats (most processed foods list trans fats on the Nutrition Facts label).  Regular soda.  Juice.  Candy.  Sweets, such as cake, pie, doughnuts, and cookies.  Fried foods. WHAT FOODS CAN I EAT? Have nutrient-rich foods, which will nourish your body and keep you healthy. The food you should eat also will depend on several factors, including:  The calories you need.  The medicines you take.  Your weight.  Your blood glucose level.  Your blood pressure level.  Your cholesterol level. You also should eat a variety of foods, including:  Protein, such as meat, poultry, fish, tofu, nuts, and seeds (lean animal proteins are best).  Fruits.  Vegetables.  Dairy products, such as milk, cheese, and yogurt (low fat is best).  Breads, grains, pasta, cereal, rice, and beans.  Fats such as olive oil, trans fat-free margarine, canola oil, avocado, and olives. DOES EVERYONE WITH DIABETES MELLITUS HAVE THE SAME MEAL PLAN? Because every person with diabetes mellitus is different, there is not one meal plan that works for everyone. It is very important that you meet with a dietitian who will help you create a meal plan that is just right for you. Document Released: 06/02/2005 Document Revised: 09/10/2013 Document Reviewed: 08/02/2013 [employer] Patient Information 2015 [employer]. This   information is not intended to replace advice given to you by your health care provider. Make sure you discuss any questions you have with your health care provider.  

## 2014-04-09 ENCOUNTER — Ambulatory Visit: Payer: Self-pay | Attending: Internal Medicine

## 2014-04-09 DIAGNOSIS — E089 Diabetes mellitus due to underlying condition without complications: Secondary | ICD-10-CM

## 2014-04-09 DIAGNOSIS — Z139 Encounter for screening, unspecified: Secondary | ICD-10-CM

## 2014-04-09 LAB — LIPID PANEL
CHOL/HDL RATIO: 4 ratio
CHOLESTEROL: 188 mg/dL (ref 0–200)
HDL: 47 mg/dL (ref >39)
LDL Cholesterol: 123 mg/dL — ABNORMAL HIGH (ref 0–99)
Triglycerides: 92 mg/dL (ref <150)
VLDL: 18 mg/dL (ref 0–40)

## 2014-04-09 LAB — CBC WITH DIFFERENTIAL/PLATELET
BASOS ABS: 0 10*3/uL (ref 0.0–0.1)
BASOS PCT: 0 % (ref 0–1)
EOS ABS: 0.1 10*3/uL (ref 0.0–0.7)
Eosinophils Relative: 1 % (ref 0–5)
HCT: 42.6 % (ref 39.0–52.0)
HEMOGLOBIN: 13.6 g/dL (ref 13.0–17.0)
Lymphocytes Relative: 32 % (ref 12–46)
Lymphs Abs: 1.7 10*3/uL (ref 0.7–4.0)
MCH: 25 pg — AB (ref 26.0–34.0)
MCHC: 31.9 g/dL (ref 30.0–36.0)
MCV: 78.2 fL (ref 78.0–100.0)
MONOS PCT: 7 % (ref 3–12)
Monocytes Absolute: 0.4 10*3/uL (ref 0.1–1.0)
NEUTROS ABS: 3.2 10*3/uL (ref 1.7–7.7)
NEUTROS PCT: 60 % (ref 43–77)
Platelets: 246 10*3/uL (ref 150–400)
RBC: 5.45 MIL/uL (ref 4.22–5.81)
RDW: 14.5 % (ref 11.5–15.5)
WBC: 5.3 10*3/uL (ref 4.0–10.5)

## 2014-04-09 LAB — COMPLETE METABOLIC PANEL WITH GFR
ALK PHOS: 79 U/L (ref 39–117)
ALT: 9 U/L (ref 0–53)
AST: 10 U/L (ref 0–37)
Albumin: 4.2 g/dL (ref 3.5–5.2)
BILIRUBIN TOTAL: 0.5 mg/dL (ref 0.2–1.2)
BUN: 11 mg/dL (ref 6–23)
CO2: 27 mEq/L (ref 19–32)
Calcium: 9.2 mg/dL (ref 8.4–10.5)
Chloride: 101 mEq/L (ref 96–112)
Creat: 1.06 mg/dL (ref 0.50–1.35)
GFR, Est African American: >89 mL/min
GFR, Est Non African American: 89 mL/min
GLUCOSE: 382 mg/dL — AB (ref 70–99)
Potassium: 4.3 mEq/L (ref 3.5–5.3)
SODIUM: 138 meq/L (ref 135–145)
Total Protein: 7 g/dL (ref 6.0–8.3)

## 2014-04-09 LAB — TSH: TSH: 1.446 u[IU]/mL (ref 0.350–4.500)

## 2014-04-10 ENCOUNTER — Telehealth: Payer: Self-pay

## 2014-04-10 LAB — VITAMIN D 25 HYDROXY (VIT D DEFICIENCY, FRACTURES): VIT D 25 HYDROXY: 19 ng/mL — AB (ref 30–89)

## 2014-04-10 MED ORDER — VITAMIN D (ERGOCALCIFEROL) 1.25 MG (50000 UNIT) PO CAPS: 50000.0000 [IU] | ORAL_CAPSULE | ORAL | Status: DC

## 2014-04-10 NOTE — Telephone Encounter (Signed)
Patient is aware of his lab results Prescription sent to [facility] [place]

## 2014-04-10 NOTE — Telephone Encounter (Signed)
Message copied by Lestine MountJUAREZ, Lynita Groseclose L on Thu Apr 10, 2014  2:42 PM ------      Message from: Doris CheadleADVANI, DEEPAK      Created: Thu Apr 10, 2014 10:07 AM       Blood work reviewed, noticed low vitamin D, call patient advise to start ergocalciferol 50,000 units once a week for the duration of  12 weeks.       ------

## 2014-05-06 ENCOUNTER — Other Ambulatory Visit: Payer: Self-pay

## 2014-12-03 ENCOUNTER — Encounter: Payer: Self-pay | Admitting: Internal Medicine

## 2014-12-03 ENCOUNTER — Ambulatory Visit: Payer: Self-pay | Attending: Internal Medicine | Admitting: Internal Medicine

## 2014-12-03 VITALS — BP 122/83 | HR 80 | Temp 98.0°F | Resp 16 | Wt 132.4 lb

## 2014-12-03 DIAGNOSIS — Z720 Tobacco use: Secondary | ICD-10-CM | POA: Insufficient documentation

## 2014-12-03 DIAGNOSIS — E559 Vitamin D deficiency, unspecified: Secondary | ICD-10-CM | POA: Insufficient documentation

## 2014-12-03 DIAGNOSIS — F17200 Nicotine dependence, unspecified, uncomplicated: Secondary | ICD-10-CM

## 2014-12-03 DIAGNOSIS — E089 Diabetes mellitus due to underlying condition without complications: Secondary | ICD-10-CM

## 2014-12-03 DIAGNOSIS — E119 Type 2 diabetes mellitus without complications: Secondary | ICD-10-CM | POA: Insufficient documentation

## 2014-12-03 DIAGNOSIS — Z794 Long term (current) use of insulin: Secondary | ICD-10-CM | POA: Insufficient documentation

## 2014-12-03 DIAGNOSIS — R0981 Nasal congestion: Secondary | ICD-10-CM | POA: Insufficient documentation

## 2014-12-03 LAB — POCT GLYCOSYLATED HEMOGLOBIN (HGB A1C)

## 2014-12-03 LAB — GLUCOSE, POCT (MANUAL RESULT ENTRY): POC Glucose: 276 mg/dl — AB (ref 70–99)

## 2014-12-03 MED ORDER — INSULIN ASPART PROT & ASPART (70-30 MIX) 100 UNIT/ML ~~LOC~~ SUSP: 15.0000 [IU] | Freq: Two times a day (BID) | SUBCUTANEOUS | Status: DC

## 2014-12-03 MED ORDER — FLUTICASONE PROPIONATE 50 MCG/ACT NA SUSP: 2.0000 | Freq: Every day | NASAL | Status: DC

## 2014-12-03 NOTE — Progress Notes (Signed)
Patient here for follow up on his diabetes 

## 2014-12-03 NOTE — Patient Instructions (Signed)
Diabetes Mellitus and Food It is important for you to manage your blood sugar (glucose) level. Your blood glucose level can be greatly affected by what you eat. Eating healthier foods in the appropriate amounts throughout the day at about the same time each day will help you control your blood glucose level. It can also help slow or prevent worsening of your diabetes mellitus. Healthy eating may even help you improve the level of your blood pressure and reach or maintain a healthy weight.  HOW CAN FOOD AFFECT ME? Carbohydrates Carbohydrates affect your blood glucose level more than any other type of food. Your dietitian will help you determine how many carbohydrates to eat at each meal and teach you how to count carbohydrates. Counting carbohydrates is important to keep your blood glucose at a healthy level, especially if you are using insulin or taking certain medicines for diabetes mellitus. Alcohol Alcohol can cause sudden decreases in blood glucose (hypoglycemia), especially if you use insulin or take certain medicines for diabetes mellitus. Hypoglycemia can be a life-threatening condition. Symptoms of hypoglycemia (sleepiness, dizziness, and disorientation) are similar to symptoms of having too much alcohol.  If your health care provider has given you approval to drink alcohol, do so in moderation and use the following guidelines:  Women should not have more than one drink per day, and men should not have more than two drinks per day. One drink is equal to:  12 oz of beer.  5 oz of wine.  1 oz of hard liquor.  Do not drink on an empty stomach.  Keep yourself hydrated. Have water, diet soda, or unsweetened iced tea.  Regular soda, juice, and other mixers might contain a lot of carbohydrates and should be counted. WHAT FOODS ARE NOT RECOMMENDED? As you make food choices, it is important to remember that all foods are not the same. Some foods have fewer nutrients per serving than other  foods, even though they might have the same number of calories or carbohydrates. It is difficult to get your body what it needs when you eat foods with fewer nutrients. Examples of foods that you should avoid that are high in calories and carbohydrates but low in nutrients include:  Trans fats (most processed foods list trans fats on the Nutrition Facts label).  Regular soda.  Juice.  Candy.  Sweets, such as cake, pie, doughnuts, and cookies.  Fried foods. WHAT FOODS CAN I EAT? Have nutrient-rich foods, which will nourish your body and keep you healthy. The food you should eat also will depend on several factors, including:  The calories you need.  The medicines you take.  Your weight.  Your blood glucose level.  Your blood pressure level.  Your cholesterol level. You also should eat a variety of foods, including:  Protein, such as meat, poultry, fish, tofu, nuts, and seeds (lean animal proteins are best).  Fruits.  Vegetables.  Dairy products, such as milk, cheese, and yogurt (low fat is best).  Breads, grains, pasta, cereal, rice, and beans.  Fats such as olive oil, trans fat-free margarine, canola oil, avocado, and olives. DOES EVERYONE WITH DIABETES MELLITUS HAVE THE SAME MEAL PLAN? Because every person with diabetes mellitus is different, there is not one meal plan that works for everyone. It is very important that you meet with a dietitian who will help you create a meal plan that is just right for you. Document Released: 06/02/2005 Document Revised: 09/10/2013 Document Reviewed: 08/02/2013 [employer] Patient Information 2015 [employer]. This   information is not intended to replace advice given to you by your health care provider. Make sure you discuss any questions you have with your health care provider.  

## 2014-12-03 NOTE — Progress Notes (Signed)
MRN: [id number] Name: [name]  Sex: male Age: 39 y.o. DOB: 1976/02/24  Allergies: Review of patient's allergies indicates no known allergies.  Chief Complaint  Patient presents with  . Follow-up    HPI: Patient is 39 y.o. male who Has to of type 1 diabetes currently on insulin, as per patient is taking 10-15 units 2 times a day, he denies any hypoglycemic symptoms, his fasting sugar is usually more than 1 50 mg/dL,his hemoglobin W0J is greater than 15%, patient was referred to endocrinology in the past but could not make an appointment, need another referral, I have advised patient for diabetes meal planning. Patient also history of vitamin D deficiency.is also complaining of occasional cough postnasal drip denies any chest pain or shortness of breath.patient is to smoke cigarettes, counseled  to quit smoking.  Past Medical History  Diagnosis Date  . Diabetes mellitus     Newly diagnosed, May 2012    Past Surgical History  Procedure Laterality Date  . Appendectomy    . Tonsillectomy        Medication List       This list is accurate as of: 12/03/14  4:15 PM.  Always use your most recent med list.               fluticasone 50 MCG/ACT nasal spray  Commonly known as:  FLONASE  Place 2 sprays into both nostrils daily.     glucose blood test strip  Commonly known as:  CHOICE DM FORA G20 TEST STRIPS  Use as instructed     insulin aspart protamine- aspart (70-30) 100 UNIT/ML injection  Commonly known as:  NOVOLOG MIX 70/30  Inject 0.15 mLs (15 Units total) into the skin 2 (two) times daily with a meal.     INSULIN SYRINGE .3CC/29GX1/2" 29G X 1/2" 0.3 ML Misc  Check blood sugar TID & QHS     Vitamin D (Ergocalciferol) 50000 UNITS Caps capsule  Commonly known as:  DRISDOL  Take 1 capsule (50,000 Units total) by mouth every 7 (seven) days.        Meds ordered this encounter  Medications  . insulin aspart protamine- aspart (NOVOLOG MIX 70/30) (70-30) 100  UNIT/ML injection    Sig: Inject 0.15 mLs (15 Units total) into the skin 2 (two) times daily with a meal.    Dispense:  10 mL    Refill:  3  . fluticasone (FLONASE) 50 MCG/ACT nasal spray    Sig: Place 2 sprays into both nostrils daily.    Dispense:  16 g    Refill:  2     There is no immunization history on file for this patient.  Family History  Problem Relation Age of Onset  . Diabetes Sister     Several brothers and sister with DM  . Diabetes Brother   . Hypertension Mother     History  Substance Use Topics  . Smoking status: Current Every Day Smoker  . Smokeless tobacco: Not on file  . Alcohol Use: No    Review of Systems   As noted in HPI  Filed Vitals:   12/03/14 1549  BP: 122/83  Pulse: 80  Temp: 98 F (36.7 C)  Resp: 16    Physical Exam  Physical Exam  HENT:  Nasal congestion no sinus tenderness, no pharyngeal erythema  Eyes: EOM are normal. Pupils are equal, round, and reactive to light.  Cardiovascular: Normal rate and regular rhythm.   Pulmonary/Chest: Breath  sounds normal. No respiratory distress. He has no wheezes. He has no rales.  Musculoskeletal: He exhibits no edema.    CBC    Component Value Date/Time   WBC 5.3 04/09/2014 1106   RBC 5.45 04/09/2014 1106   HGB 13.6 04/09/2014 1106   HCT 42.6 04/09/2014 1106   PLT 246 04/09/2014 1106   MCV 78.2 04/09/2014 1106   LYMPHSABS 1.7 04/09/2014 1106   MONOABS 0.4 04/09/2014 1106   EOSABS 0.1 04/09/2014 1106   BASOSABS 0.0 04/09/2014 1106    CMP     Component Value Date/Time   NA 138 04/09/2014 1106   K 4.3 04/09/2014 1106   CL 101 04/09/2014 1106   CO2 27 04/09/2014 1106   GLUCOSE 382* 04/09/2014 1106   BUN 11 04/09/2014 1106   CREATININE 1.06 04/09/2014 1106   CREATININE 0.93 03/29/2014 0935   CALCIUM 9.2 04/09/2014 1106   PROT 7.0 04/09/2014 1106   ALBUMIN 4.2 04/09/2014 1106   AST 10 04/09/2014 1106   ALT 9 04/09/2014 1106   ALKPHOS 79 04/09/2014 1106   BILITOT 0.5  04/09/2014 1106   GFRNONAA 89 04/09/2014 1106   GFRNONAA >90 03/29/2014 0935   GFRAA >89 04/09/2014 1106   GFRAA >90 03/29/2014 0935    Lab Results  Component Value Date/Time   CHOL 188 04/09/2014 11:06 AM    No components found for: HGA1C  Lab Results  Component Value Date/Time   AST 10 04/09/2014 11:06 AM    Assessment and Plan  Diabetes mellitus due to underlying condition without complications - Plan:  Results for orders placed or performed in visit on 12/03/14  Glucose (CBG)  Result Value Ref Range   POC Glucose 276 (A) 70 - 99 mg/dl  HgB [id number]  Result Value Ref Range   Hemoglobin A1C >15.0    Diabetes is uncontrolled,HgB A1c has trended up, I have advised patient for diabetes meal planning, he will increase the dose of insulin to 15 units twice a day, keep the fingerstick log he will titrate her to 17 units if he is fasting sugar is still more than 1 50 mg/dL, he'll come back in 2 weeks for nurse visit CBG check.  insulin aspart protamine- aspart (NOVOLOG MIX 70/30) (70-30) 100 UNIT/ML injection, Ambulatory referral to Endocrinology, COMPLETE METABOLIC PANEL WITH GFR  Smoking counseled to quit smoking  Vitamin D deficiency Patient will start taking over-the-counter vitamin D 2000 units daily  Nasal congestion - Plan: fluticasone (FLONASE) 50 MCG/ACT nasal spray   Health Maintenance  -Vaccinations:  Patient declines flu shot and Pneumovax.   Return in about 3 months (around 03/05/2015) for diabetes, CBG check in 2 weeks/Nurse Visit.   This note has been created with Education officer, environmentalDragon speech recognition software and smart phrase technology. Any transcriptional errors are unintentional.    Doris CheadleADVANI, Joscelynn Brutus, MD

## 2014-12-04 LAB — COMPLETE METABOLIC PANEL WITH GFR
ALT: 11 U/L (ref 0–53)
AST: 12 U/L (ref 0–37)
Albumin: 4.3 g/dL (ref 3.5–5.2)
Alkaline Phosphatase: 104 U/L (ref 39–117)
BILIRUBIN TOTAL: 0.7 mg/dL (ref 0.2–1.2)
BUN: 18 mg/dL (ref 6–23)
CHLORIDE: 100 meq/L (ref 96–112)
CO2: 28 mEq/L (ref 19–32)
CREATININE: 1.11 mg/dL (ref 0.50–1.35)
Calcium: 10 mg/dL (ref 8.4–10.5)
GFR, Est African American: >89 mL/min
GFR, Est Non African American: 84 mL/min
Glucose, Bld: 234 mg/dL — ABNORMAL HIGH (ref 70–99)
POTASSIUM: 4 meq/L (ref 3.5–5.3)
Sodium: 138 mEq/L (ref 135–145)
Total Protein: 7.8 g/dL (ref 6.0–8.3)

## 2014-12-22 ENCOUNTER — Ambulatory Visit: Payer: Self-pay

## 2014-12-23 ENCOUNTER — Ambulatory Visit: Payer: Self-pay | Attending: Internal Medicine | Admitting: *Deleted

## 2014-12-23 DIAGNOSIS — E119 Type 2 diabetes mellitus without complications: Secondary | ICD-10-CM | POA: Insufficient documentation

## 2014-12-23 DIAGNOSIS — Z794 Long term (current) use of insulin: Secondary | ICD-10-CM | POA: Insufficient documentation

## 2014-12-23 DIAGNOSIS — F17210 Nicotine dependence, cigarettes, uncomplicated: Secondary | ICD-10-CM | POA: Insufficient documentation

## 2014-12-23 DIAGNOSIS — E089 Diabetes mellitus due to underlying condition without complications: Secondary | ICD-10-CM

## 2014-12-23 MED ORDER — INSULIN ASPART PROT & ASPART (70-30 MIX) 100 UNIT/ML ~~LOC~~ SUSP: 17.0000 [IU] | Freq: Two times a day (BID) | SUBCUTANEOUS | Status: DC

## 2014-12-23 NOTE — Patient Instructions (Signed)
Diabetes Mellitus and Food It is important for you to manage your blood sugar (glucose) level. Your blood glucose level can be greatly affected by what you eat. Eating healthier foods in the appropriate amounts throughout the day at about the same time each day will help you control your blood glucose level. It can also help slow or prevent worsening of your diabetes mellitus. Healthy eating may even help you improve the level of your blood pressure and reach or maintain a healthy weight.  HOW CAN FOOD AFFECT ME? Carbohydrates Carbohydrates affect your blood glucose level more than any other type of food. Your dietitian will help you determine how many carbohydrates to eat at each meal and teach you how to count carbohydrates. Counting carbohydrates is important to keep your blood glucose at a healthy level, especially if you are using insulin or taking certain medicines for diabetes mellitus. Alcohol Alcohol can cause sudden decreases in blood glucose (hypoglycemia), especially if you use insulin or take certain medicines for diabetes mellitus. Hypoglycemia can be a life-threatening condition. Symptoms of hypoglycemia (sleepiness, dizziness, and disorientation) are similar to symptoms of having too much alcohol.  If your health care provider has given you approval to drink alcohol, do so in moderation and use the following guidelines:  Women should not have more than one drink per day, and men should not have more than two drinks per day. One drink is equal to:  12 oz of beer.  5 oz of wine.  1 oz of hard liquor.  Do not drink on an empty stomach.  Keep yourself hydrated. Have water, diet soda, or unsweetened iced tea.  Regular soda, juice, and other mixers might contain a lot of carbohydrates and should be counted. WHAT FOODS ARE NOT RECOMMENDED? As you make food choices, it is important to remember that all foods are not the same. Some foods have fewer nutrients per serving than other  foods, even though they might have the same number of calories or carbohydrates. It is difficult to get your body what it needs when you eat foods with fewer nutrients. Examples of foods that you should avoid that are high in calories and carbohydrates but low in nutrients include:  Trans fats (most processed foods list trans fats on the Nutrition Facts label).  Regular soda.  Juice.  Candy.  Sweets, such as cake, pie, doughnuts, and cookies.  Fried foods. WHAT FOODS CAN I EAT? Have nutrient-rich foods, which will nourish your body and keep you healthy. The food you should eat also will depend on several factors, including:  The calories you need.  The medicines you take.  Your weight.  Your blood glucose level.  Your blood pressure level.  Your cholesterol level. You also should eat a variety of foods, including:  Protein, such as meat, poultry, fish, tofu, nuts, and seeds (lean animal proteins are best).  Fruits.  Vegetables.  Dairy products, such as milk, cheese, and yogurt (low fat is best).  Breads, grains, pasta, cereal, rice, and beans.  Fats such as olive oil, trans fat-free margarine, canola oil, avocado, and olives. DOES EVERYONE WITH DIABETES MELLITUS HAVE THE SAME MEAL PLAN? Because every person with diabetes mellitus is different, there is not one meal plan that works for everyone. It is very important that you meet with a dietitian who will help you create a meal plan that is just right for you. Document Released: 06/02/2005 Document Revised: 09/10/2013 Document Reviewed: 08/02/2013 ExitCare Patient Information 2015 [employer]. This   information is not intended to replace advice given to you by your health care provider. Make sure you discuss any questions you have with your health care provider. Diabetes and Exercise Exercising regularly is important. It is not just about losing weight. It has many health benefits, such as:  Improving your overall  fitness, flexibility, and endurance.  Increasing your bone density.  Helping with weight control.  Decreasing your body fat.  Increasing your muscle strength.  Reducing stress and tension.  Improving your overall health. People with diabetes who exercise gain additional benefits because exercise:  Reduces appetite.  Improves the body's use of blood sugar (glucose).  Helps lower or control blood glucose.  Decreases blood pressure.  Helps control blood lipids (such as cholesterol and triglycerides).  Improves the body's use of the hormone insulin by:  Increasing the body's insulin sensitivity.  Reducing the body's insulin needs.  Decreases the risk for heart disease because exercising:  Lowers cholesterol and triglycerides levels.  Increases the levels of good cholesterol (such as high-density lipoproteins [HDL]) in the body.  Lowers blood glucose levels. YOUR ACTIVITY PLAN  Choose an activity that you enjoy and set realistic goals. Your health care provider or diabetes educator can help you make an activity plan that works for you. Exercise regularly as directed by your health care provider. This includes:  Performing resistance training twice a week such as push-ups, sit-ups, lifting weights, or using resistance bands.  Performing 150 minutes of cardio exercises each week such as walking, running, or playing sports.  Staying active and spending no more than 90 minutes at one time being inactive. Even short bursts of exercise are good for you. Three 10-minute sessions spread throughout the day are just as beneficial as a single 30-minute session. Some exercise ideas include:  Taking the dog for a walk.  Taking the stairs instead of the elevator.  Dancing to your favorite song.  Doing an exercise video.  Doing your favorite exercise with a friend. RECOMMENDATIONS FOR EXERCISING WITH TYPE 1 OR TYPE 2 DIABETES   Check your blood glucose before exercising. If  blood glucose levels are greater than 240 mg/dL, check for urine ketones. Do not exercise if ketones are present.  Avoid injecting insulin into areas of the body that are going to be exercised. For example, avoid injecting insulin into:  The arms when playing tennis.  The legs when jogging.  Keep a record of:  Food intake before and after you exercise.  Expected peak times of insulin action.  Blood glucose levels before and after you exercise.  The type and amount of exercise you have done.  Review your records with your health care provider. Your health care provider will help you to develop guidelines for adjusting food intake and insulin amounts before and after exercising.  If you take insulin or oral hypoglycemic agents, watch for signs and symptoms of hypoglycemia. They include:  Dizziness.  Shaking.  Sweating.  Chills.  Confusion.  Drink plenty of water while you exercise to prevent dehydration or heat stroke. Body water is lost during exercise and must be replaced.  Talk to your health care provider before starting an exercise program to make sure it is safe for you. Remember, almost any type of activity is better than none. Document Released: 11/26/2003 Document Revised: 01/20/2014 Document Reviewed: 02/12/2013 ExitCare Patient Information 2015 [employer]. This information is not intended to replace advice given to you by your health care provider. Make sure you discuss any   questions you have with your health care provider. Basic Carbohydrate Counting for Diabetes Mellitus Carbohydrate counting is a method for keeping track of the amount of carbohydrates you eat. Eating carbohydrates naturally increases the level of sugar (glucose) in your blood, so it is important for you to know the amount that is okay for you to have in every meal. Carbohydrate counting helps keep the level of glucose in your blood within normal limits. The amount of carbohydrates allowed is  different for every person. A dietitian can help you calculate the amount that is right for you. Once you know the amount of carbohydrates you can have, you can count the carbohydrates in the foods you want to eat. Carbohydrates are found in the following foods:  Grains, such as breads and cereals.  Dried beans and soy products.  Starchy vegetables, such as potatoes, peas, and corn.  Fruit and fruit juices.  Milk and yogurt.  Sweets and snack foods, such as cake, cookies, candy, chips, soft drinks, and fruit drinks. CARBOHYDRATE COUNTING There are two ways to count the carbohydrates in your food. You can use either of the methods or a combination of both. Reading the "Nutrition Facts" on Packaged Food The "Nutrition Facts" is an area that is included on the labels of almost all packaged food and beverages in the [place]. It includes the serving size of that food or beverage and information about the nutrients in each serving of the food, including the grams (g) of carbohydrate per serving.  Decide the number of servings of this food or beverage that you will be able to eat or drink. Multiply that number of servings by the number of grams of carbohydrate that is listed on the label for that serving. The total will be the amount of carbohydrates you will be having when you eat or drink this food or beverage. Learning Standard Serving Sizes of Food When you eat food that is not packaged or does not include "Nutrition Facts" on the label, you need to measure the servings in order to count the amount of carbohydrates.A serving of most carbohydrate-rich foods contains about 15 g of carbohydrates. The following list includes serving sizes of carbohydrate-rich foods that provide 15 g ofcarbohydrate per serving:   1 slice of bread (1 oz) or 1 six-inch tortilla.    of a hamburger bun or English muffin.  4-6 crackers.   cup unsweetened dry cereal.    cup hot cereal.   cup rice or  pasta.    cup mashed potatoes or  of a large baked potato.  1 cup fresh fruit or one small piece of fruit.    cup canned or frozen fruit or fruit juice.  1 cup milk.   cup plain fat-free yogurt or yogurt sweetened with artificial sweeteners.   cup cooked dried beans or starchy vegetable, such as peas, corn, or potatoes.  Decide the number of standard-size servings that you will eat. Multiply that number of servings by 15 (the grams of carbohydrates in that serving). For example, if you eat 2 cups of strawberries, you will have eaten 2 servings and 30 g of carbohydrates (2 servings x 15 g = 30 g). For foods such as soups and casseroles, in which more than one food is mixed in, you will need to count the carbohydrates in each food that is included. EXAMPLE OF CARBOHYDRATE COUNTING Sample Dinner  3 oz chicken breast.   cup of brown rice.   cup of corn.    1 cup milk.   1 cup strawberries with sugar-free whipped topping.  Carbohydrate Calculation Step 1: Identify the foods that contain carbohydrates:   Rice.   Corn.   Milk.   Strawberries. Step 2:Calculate the number of servings eaten of each:   2 servings of rice.   1 serving of corn.   1 serving of milk.   1 serving of strawberries. Step 3: Multiply each of those number of servings by 15 g:   2 servings of rice x 15 g = 30 g.   1 serving of corn x 15 g = 15 g.   1 serving of milk x 15 g = 15 g.   1 serving of strawberries x 15 g = 15 g. Step 4: Add together all of the amounts to find the total grams of carbohydrates eaten: 30 g + 15 g + 15 g + 15 g = 75 g. Document Released: 09/05/2005 Document Revised: 01/20/2014 Document Reviewed: 08/02/2013 ExitCare Patient Information 2015 [employer]. This information is not intended to replace advice given to you by your health care provider. Make sure you discuss any questions you have with your health care provider.  

## 2014-12-23 NOTE — Progress Notes (Signed)
Patient presents for CBG record review; patient brought glucometer only Patient given log and instructed on use. Instructed to bring log to all future visits. Med list reviewed; patient reports taking all meds as directed Patient's AM fasting blood sugars ranging 170-370 Patient's before dinner blood sugars ranging 160-250 Smoking 1-2 cigs/day; attempting to quit  Per PCP's last note: Increase 70/30 insulin to 17 units bid  Patient to return in 2 weeks for nurse visit for CBG and record review  Patient given literature on Diabetes and Food, Diabetes and Exercise, Basic Carb Counting, The Plate Method and hypoglycemia

## 2014-12-31 ENCOUNTER — Ambulatory Visit: Payer: Self-pay | Attending: Internal Medicine

## 2015-01-06 ENCOUNTER — Encounter: Payer: Self-pay | Admitting: Internal Medicine

## 2015-01-06 ENCOUNTER — Ambulatory Visit: Payer: Self-pay | Attending: Internal Medicine | Admitting: *Deleted

## 2015-01-06 DIAGNOSIS — Z794 Long term (current) use of insulin: Secondary | ICD-10-CM | POA: Insufficient documentation

## 2015-01-06 DIAGNOSIS — E1165 Type 2 diabetes mellitus with hyperglycemia: Secondary | ICD-10-CM

## 2015-01-06 DIAGNOSIS — E1065 Type 1 diabetes mellitus with hyperglycemia: Secondary | ICD-10-CM | POA: Insufficient documentation

## 2015-01-06 LAB — GLUCOSE, POCT (MANUAL RESULT ENTRY)
POC GLUCOSE: 289 mg/dL — AB (ref 70–99)
POC GLUCOSE: 253 mg/dL — AB (ref 70–99)

## 2015-01-06 MED ORDER — INSULIN ASPART 100 UNIT/ML ~~LOC~~ SOLN
10.0000 [IU] | Freq: Once | SUBCUTANEOUS | Status: AC
  Administered 2015-01-06: 10 [IU] via SUBCUTANEOUS

## 2015-01-06 NOTE — Progress Notes (Signed)
Patient presents with mother for CBG and record review for T1DM Med list reviewed; patient reports taking 70/30 insulin 15 units bid instead of 17 units prescribed at last nurse visit. States he was concerned when he saw low numbers. When asked what he considered low numbers he stated 185. Educated patient on goals for AM fasting are 90-110 and after meals < 160 Hgb A1C >15 on 12/03/14  S/sx of hypoglycemia and immediate actions to take discussed in detail Patient's AM fasting blood sugars ranging 185-294 Patient's before dinner blood sugars ranging 259-316  States he's working out (push ups, lifting weights) for exercise. Encouraged walking 30 minutes per day in addition [facility] can schedule appt with endocrinologist   CBG 289 2 hours after meal 10 units novolog insulin given SQ per protocol  Patient to increase 70/30 insulin to 17 units bid as previously directed and return in 2 weeks for nurse visit for CBG and record review  CBG 259 30 minutes after insulin admin. Patient discharged to home in stable condition.

## 2015-03-31 ENCOUNTER — Telehealth: Payer: Self-pay

## 2015-03-31 ENCOUNTER — Telehealth: Payer: Self-pay | Admitting: Internal Medicine

## 2015-03-31 NOTE — Telephone Encounter (Signed)
Patient's mother called requesting to speak to nurse regarding patient having hip pain. Please f/u

## 2015-03-31 NOTE — Telephone Encounter (Signed)
Patient called requesting an appointment to be Appointment was given for this week

## 2015-04-01 ENCOUNTER — Encounter: Payer: Self-pay | Admitting: Internal Medicine

## 2015-04-01 ENCOUNTER — Ambulatory Visit: Payer: Self-pay | Attending: Internal Medicine | Admitting: Internal Medicine

## 2015-04-01 VITALS — BP 119/85 | HR 94 | Temp 98.0°F | Resp 16 | Wt 131.0 lb

## 2015-04-01 DIAGNOSIS — R200 Anesthesia of skin: Secondary | ICD-10-CM | POA: Insufficient documentation

## 2015-04-01 DIAGNOSIS — M25559 Pain in unspecified hip: Secondary | ICD-10-CM | POA: Insufficient documentation

## 2015-04-01 DIAGNOSIS — Z79899 Other long term (current) drug therapy: Secondary | ICD-10-CM | POA: Insufficient documentation

## 2015-04-01 DIAGNOSIS — R202 Paresthesia of skin: Secondary | ICD-10-CM | POA: Insufficient documentation

## 2015-04-01 DIAGNOSIS — E119 Type 2 diabetes mellitus without complications: Secondary | ICD-10-CM | POA: Insufficient documentation

## 2015-04-01 DIAGNOSIS — Z794 Long term (current) use of insulin: Secondary | ICD-10-CM | POA: Insufficient documentation

## 2015-04-01 DIAGNOSIS — F17210 Nicotine dependence, cigarettes, uncomplicated: Secondary | ICD-10-CM | POA: Insufficient documentation

## 2015-04-01 DIAGNOSIS — E139 Other specified diabetes mellitus without complications: Secondary | ICD-10-CM

## 2015-04-01 DIAGNOSIS — E559 Vitamin D deficiency, unspecified: Secondary | ICD-10-CM | POA: Insufficient documentation

## 2015-04-01 LAB — COMPLETE METABOLIC PANEL WITH GFR
ALT: 13 U/L (ref 0–53)
AST: 13 U/L (ref 0–37)
Albumin: 3.9 g/dL (ref 3.5–5.2)
Alkaline Phosphatase: 72 U/L (ref 39–117)
BUN: 19 mg/dL (ref 6–23)
CHLORIDE: 102 meq/L (ref 96–112)
CO2: 29 meq/L (ref 19–32)
CREATININE: 1 mg/dL (ref 0.50–1.35)
Calcium: 9.4 mg/dL (ref 8.4–10.5)
GFR, Est African American: >89 mL/min
Glucose, Bld: 230 mg/dL — ABNORMAL HIGH (ref 70–99)
Potassium: 4.2 mEq/L (ref 3.5–5.3)
Sodium: 138 mEq/L (ref 135–145)
Total Bilirubin: 0.4 mg/dL (ref 0.2–1.2)
Total Protein: 6.7 g/dL (ref 6.0–8.3)

## 2015-04-01 LAB — GLUCOSE, POCT (MANUAL RESULT ENTRY): POC GLUCOSE: 270 mg/dL — AB (ref 70–99)

## 2015-04-01 LAB — POCT GLYCOSYLATED HEMOGLOBIN (HGB A1C): Hemoglobin A1C: 12.9

## 2015-04-01 MED ORDER — GABAPENTIN 100 MG PO CAPS: 100.0000 mg | ORAL_CAPSULE | Freq: Every day | ORAL | Status: DC

## 2015-04-01 NOTE — Progress Notes (Signed)
Patient complains of hip pain , pain in his thighs and buttocks Patient states basically having generalized pain all over

## 2015-04-01 NOTE — Progress Notes (Signed)
MRN: [id number] Name: [name]  Sex: male Age: 39 y.o. DOB: 04/19/1976  Allergies: Review of patient's allergies indicates no known allergies.  Chief Complaint  Patient presents with  . Hip Pain    HPI: Patient is 39 y.o. male who has history of type 1 diabetes, which has been uncontrolled in the past, patient takes insulin 16 units twice a day, his hemoglobin A1c has trended down but is still elevated, currently he denies any headache dizziness chest and shortness of breath does complain of generalized muscle soreness, numbness tingling in the back and buttock area, denies any fall or trauma, also history of vitamin D deficiency currently taking vitamin D 2000 units daily.  Past Medical History  Diagnosis Date  . Diabetes mellitus     Newly diagnosed, May 2012    Past Surgical History  Procedure Laterality Date  . Appendectomy    . Tonsillectomy        Medication List       This list is accurate as of: 04/01/15 12:46 PM.  Always use your most recent med list.               fluticasone 50 MCG/ACT nasal spray  Commonly known as:  FLONASE  Place 2 sprays into both nostrils daily.     gabapentin 100 MG capsule  Commonly known as:  NEURONTIN  Take 1 capsule (100 mg total) by mouth at bedtime.     glucose blood test strip  Commonly known as:  CHOICE DM FORA G20 TEST STRIPS  Use as instructed     insulin aspart protamine- aspart (70-30) 100 UNIT/ML injection  Commonly known as:  NOVOLOG MIX 70/30  Inject 0.17 mLs (17 Units total) into the skin 2 (two) times daily with a meal.     INSULIN SYRINGE .3CC/29GX1/2" 29G X 1/2" 0.3 ML Misc  Check blood sugar TID & QHS     Vitamin D (Ergocalciferol) 50000 UNITS Caps capsule  Commonly known as:  DRISDOL  Take 1 capsule (50,000 Units total) by mouth every 7 (seven) days.        Meds ordered this encounter  Medications  . gabapentin (NEURONTIN) 100 MG capsule    Sig: Take 1 capsule (100 mg total) by mouth at  bedtime.    Dispense:  90 capsule    Refill:  1     There is no immunization history on file for this patient.  Family History  Problem Relation Age of Onset  . Diabetes Sister     Several brothers and sister with DM  . Diabetes Brother   . Hypertension Mother     History  Substance Use Topics  . Smoking status: Current Every Day Smoker  . Smokeless tobacco: Not on file     Comment: Smoking 1 cigs every 4 days  . Alcohol Use: No    Review of Systems   As noted in HPI  Filed Vitals:   04/01/15 1139  BP: 119/85  Pulse: 94  Temp: 98 F (36.7 C)  Resp: 16    Physical Exam  Physical Exam  Constitutional: He is oriented to person, place, and time. No distress.  Eyes: EOM are normal. Pupils are equal, round, and reactive to light.  Cardiovascular: Normal rate and regular rhythm.   Pulmonary/Chest: Breath sounds normal. No respiratory distress. He has no wheezes. He has no rales.  Abdominal: Soft. There is no tenderness. There is no rebound.  Musculoskeletal:  No spinal or  paraspinal tenderness, SLR test negative, equal strength both lower extremities  Neurological: He is alert and oriented to person, place, and time.    CBC    Component Value Date/Time   WBC 5.3 04/09/2014 1106   RBC 5.45 04/09/2014 1106   HGB 13.6 04/09/2014 1106   HCT 42.6 04/09/2014 1106   PLT 246 04/09/2014 1106   MCV 78.2 04/09/2014 1106   LYMPHSABS 1.7 04/09/2014 1106   MONOABS 0.4 04/09/2014 1106   EOSABS 0.1 04/09/2014 1106   BASOSABS 0.0 04/09/2014 1106    CMP     Component Value Date/Time   NA 138 12/03/2014 1627   K 4.0 12/03/2014 1627   CL 100 12/03/2014 1627   CO2 28 12/03/2014 1627   GLUCOSE 234* 12/03/2014 1627   BUN 18 12/03/2014 1627   CREATININE 1.11 12/03/2014 1627   CREATININE 0.93 03/29/2014 0935   CALCIUM 10.0 12/03/2014 1627   PROT 7.8 12/03/2014 1627   ALBUMIN 4.3 12/03/2014 1627   AST 12 12/03/2014 1627   ALT 11 12/03/2014 1627   ALKPHOS 104  12/03/2014 1627   BILITOT 0.7 12/03/2014 1627   GFRNONAA 84 12/03/2014 1627   GFRNONAA >90 03/29/2014 0935   GFRAA >89 12/03/2014 1627   GFRAA >90 03/29/2014 0935    Lab Results  Component Value Date/Time   CHOL 188 04/09/2014 11:06 AM    Lab Results  Component Value Date/Time   HGBA1C 12.90 04/01/2015 11:36 AM   HGBA1C * 02/12/2011 08:00 PM    15.0 (NOTE)                                                                       According to the ADA Clinical Practice Recommendations for 2011, when HbA1c is used as a screening test:   >=6.5%   Diagnostic of Diabetes Mellitus           (if abnormal result  is confirmed)  5.7-6.4%   Increased risk of developing Diabetes Mellitus  References:Diagnosis and Classification of Diabetes Mellitus,Diabetes Care,2011,34(Suppl 1):S62-S69 and Standards of Medical Care in         Diabetes - 2011,Diabetes Care,2011,34  (Suppl 1):S11-S61.    Lab Results  Component Value Date/Time   AST 12 12/03/2014 04:27 PM    Assessment and Plan  Other specified diabetes mellitus without complications - Plan:  Results for orders placed or performed in visit on 04/01/15  Glucose (CBG)  Result Value Ref Range   POC Glucose 270.0 (A) 70 - 99 mg/dl  HgB Z6X  Result Value Ref Range   Hemoglobin A1C 12.90    Her hemoglobin A1c has trended down but still elevated and diabetes is uncontrolled, have advised patient for diabetes meal planning, he will increase the dose of insulin to 17 units twice a day, patient also has already been referred to endocrinology, will repeat blood chemistry, COMPLETE METABOLIC PANEL WITH GFR  Numbness and tingling - Plan:trial of gabapentin (NEURONTIN) 100 MG capsule  Vitamin D deficiency Continue with over-the-counter vitamin D 2000 units daily   Return in about 3 months (around 07/02/2015), or if symptoms worsen or fail to improve.   This note has been created with Education officer, environmental.  Any transcriptional  errors are unintentional.    Doris Cheadle, MD

## 2015-04-02 ENCOUNTER — Telehealth: Payer: Self-pay

## 2015-04-02 NOTE — Telephone Encounter (Signed)
-----   Message from Doris Cheadleeepak Advani, MD sent at 04/02/2015 10:16 AM EDT ----- Call and let the patient know that his blood chemistry shows normal kidney function and liver function, has elevated blood sugar level, needs better control for diabetes, continue with insulin as discussed during OV, advise patient for low carbohydrate diet.

## 2015-04-02 NOTE — Telephone Encounter (Signed)
Spoke with patient's mother and she is aware of her son's lab results

## 2015-07-14 ENCOUNTER — Telehealth: Payer: Self-pay

## 2015-07-14 ENCOUNTER — Telehealth: Payer: Self-pay | Admitting: Internal Medicine

## 2015-07-14 NOTE — Telephone Encounter (Signed)
I have never seen this patient and Advani has never prescribed pain medication for him. I will need to see him first because this sounds like a new complaint. He may take OTC tylenol

## 2015-07-14 NOTE — Telephone Encounter (Signed)
Pt. Mother called stating that pt. Has a lot of leg pain and hip pain. Pt. Has an appt. On 07/20/15 to establish care but he is in a lot of pain and does not know if he can make it until his appt. Please f/u for advise.

## 2015-07-14 NOTE — Telephone Encounter (Signed)
Returned patient phone call Patient is in a lot of pain and gabapentin not helping Instructed the patient that he can alternate tylenol and ibuprofen until His appt. Next week

## 2015-07-14 NOTE — Telephone Encounter (Signed)
Patient has an appt. Next week and in a lot of pain Is there anything we can prescribe to hold him over until his appt thanks

## 2015-07-20 ENCOUNTER — Encounter: Payer: Self-pay | Admitting: Internal Medicine

## 2015-07-20 ENCOUNTER — Ambulatory Visit: Payer: Self-pay | Attending: Internal Medicine | Admitting: Internal Medicine

## 2015-07-20 VITALS — BP 133/82 | HR 100 | Temp 98.6°F | Resp 16 | Ht 72.0 in | Wt 134.6 lb

## 2015-07-20 DIAGNOSIS — Z9111 Patient's noncompliance with dietary regimen: Secondary | ICD-10-CM | POA: Insufficient documentation

## 2015-07-20 DIAGNOSIS — R636 Underweight: Secondary | ICD-10-CM | POA: Insufficient documentation

## 2015-07-20 DIAGNOSIS — Z79899 Other long term (current) drug therapy: Secondary | ICD-10-CM | POA: Insufficient documentation

## 2015-07-20 DIAGNOSIS — G629 Polyneuropathy, unspecified: Secondary | ICD-10-CM

## 2015-07-20 DIAGNOSIS — Z681 Body mass index (BMI) 19 or less, adult: Secondary | ICD-10-CM | POA: Insufficient documentation

## 2015-07-20 DIAGNOSIS — E1042 Type 1 diabetes mellitus with diabetic polyneuropathy: Secondary | ICD-10-CM | POA: Insufficient documentation

## 2015-07-20 DIAGNOSIS — E109 Type 1 diabetes mellitus without complications: Secondary | ICD-10-CM

## 2015-07-20 LAB — POCT GLYCOSYLATED HEMOGLOBIN (HGB A1C): HEMOGLOBIN A1C: 11.2

## 2015-07-20 LAB — GLUCOSE, POCT (MANUAL RESULT ENTRY): POC Glucose: 328 mg/dl — AB (ref 70–99)

## 2015-07-20 MED ORDER — "INSULIN SYRINGE-NEEDLE U-100 31G X 15/64"" 0.5 ML MISC": Status: DC

## 2015-07-20 MED ORDER — GABAPENTIN 300 MG PO CAPS: 300.0000 mg | ORAL_CAPSULE | Freq: Every day | ORAL | Status: DC

## 2015-07-20 MED ORDER — INSULIN ASPART PROT & ASPART (70-30 MIX) 100 UNIT/ML ~~LOC~~ SUSP: 19.0000 [IU] | Freq: Two times a day (BID) | SUBCUTANEOUS | Status: DC

## 2015-07-20 NOTE — Patient Instructions (Signed)
I have increased insulin to 19 units twice per day  I have increased Gabapentin to 300 mg at bedtime.

## 2015-07-20 NOTE — Progress Notes (Signed)
Patient ID: [name], male   DOB: 05/23/1976, 39 y.o.   MRN: [id number] SUBJECTIVE: 39 y.o. male for follow up of Type 1 diabetes mellitus and polyneuropathy. Diabetic Review of Systems - medication compliance: compliant most of the time, diabetic diet compliance: noncompliant much of the time, home glucose monitoring: is performed regularly, fasting values range 286-492 with a 7 day average of 367. Further diabetic ROS: no polyuria or polydipsia, no chest pain, dyspnea or TIA's, no unusual visual symptoms, no hypoglycemia, no medication side effects noted, has dysesthesias in the feet.  Other symptoms and concerns: severe pain in BLE, hip, and lower back described as pins and needles. He states that he has been taking Gabapentin 100 mg at bedtime and has not noticed any improvement in symptoms.  Current Outpatient Prescriptions  Medication Sig Dispense Refill  . Cholecalciferol (VITAMIN D) 2000 UNITS tablet Take 2,000 Units by mouth daily.    Marland Kitchen. gabapentin (NEURONTIN) 100 MG capsule Take 1 capsule (100 mg total) by mouth at bedtime. 90 capsule 1  . insulin aspart protamine- aspart (NOVOLOG MIX 70/30) (70-30) 100 UNIT/ML injection Inject 0.17 mLs (17 Units total) into the skin 2 (two) times daily with a meal. 10 mL 3  . fluticasone (FLONASE) 50 MCG/ACT nasal spray Place 2 sprays into both nostrils daily. (Patient not taking: Reported on 12/23/2014) 16 g 2  . glucose blood (CHOICE DM FORA G20 TEST STRIPS) test strip Use as instructed 100 each 12  . Insulin Syringe-Needle U-100 (INSULIN SYRINGE .3CC/29GX1/2") 29G X 1/2" 0.3 ML MISC Check blood sugar TID & QHS 100 each 2  . Vitamin D, Ergocalciferol, (DRISDOL) 50000 UNITS CAPS capsule Take 1 capsule (50,000 Units total) by mouth every 7 (seven) days. 12 capsule 0   No current facility-administered medications for this visit.    OBJECTIVE: Appearance: alert, well appearing, and in no distress, oriented to person, place, and time and underweight. BP  133/82 mmHg  Pulse 100  Temp(Src) 98.6 F (37 C)  Resp 16  Ht 6' (1.829 m)  Wt 134 lb 9.6 oz (61.054 kg)  BMI 18.25 kg/m2  SpO2 100%  Exam: heart sounds normal rate, regular rhythm, normal S1, S2, no murmurs, rubs, clicks or gallops, no JVD, chest clear, no carotid bruits, feet: warm, good capillary refill, no trophic changes or ulcerative lesions, normal DP and PT pulses, normal monofilament exam and normal sensory exam, no edema.  ASSESSMENT: [name] was seen today for leg pain.  Diagnoses and all orders for this visit:  Type 1 diabetes mellitus without complication (HCC) -     Glucose (CBG) -     HgB A1c -     insulin aspart protamine- aspart (NOVOLOG MIX 70/30) (70-30) 100 UNIT/ML injection; Inject 0.19 mLs (19 Units total) into the skin 2 (two) times daily with a meal. -     Insulin Syringe-Needle U-100 (BD INSULIN SYRINGE ULTRAFINE) 31G X 15/64" 0.5 ML MISC; Please inject insulin twice per day Patients diabetes remains uncontrolled as evidence by hemoglobin a1c >8.  Patient has been non-compliant with ADA diet recommendations. Stressed the multiple complications associated with uncontrolled diabetes including kidney injury, erectile dysfunction, increased risk for stroke and heart disease.  Patient will stay on current medication dose and report back to clinic with cbg log in 2 weeks. I have offered diabetes nutrition support but he states that he had the class 2 months ago with a friend and remembers the recommendations he just needs to follow them.  Neuropathy (HCC) -     gabapentin (NEURONTIN) 300 MG capsule; Take 1 capsule (300 mg total) by mouth at bedtime. I have increased his Gabapentin to 300 mg at bedtime. If no improvement in next 4 weeks we can increase to BID.   Underweight He denies problems with appetite. I have given him coupons for Glucerna for extra calorie intake.    Return in about 2 weeks (around 08/03/2015) for Nurse Visit-log review and 3 mo PCP  .  Ambrose Finland, NP 07/21/2015 1:45 PM

## 2015-07-20 NOTE — Progress Notes (Signed)
Patient here for follow up Complains of having severe pain to both legs Gabapentin not helping

## 2015-08-04 ENCOUNTER — Encounter: Payer: Self-pay | Admitting: Pharmacist

## 2015-09-04 ENCOUNTER — Other Ambulatory Visit: Payer: Self-pay

## 2015-09-04 ENCOUNTER — Telehealth: Payer: Self-pay | Admitting: Internal Medicine

## 2015-09-04 NOTE — Telephone Encounter (Signed)
Nurse called patient, mother answered. Patient not available.  Mother questioned if gabapentin has been refilled. Nurse explained to patient's mother to call pharmacy to refill medication.

## 2015-09-04 NOTE — Telephone Encounter (Signed)
Patient called requesting a medication refill for Gabapentin Please follow up with patient

## 2015-10-12 ENCOUNTER — Emergency Department (HOSPITAL_COMMUNITY)
Admission: EM | Admit: 2015-10-12 | Discharge: 2015-10-12 | Disposition: A | Discharge status: Home / Self Care | Payer: Self-pay | Attending: Emergency Medicine | Admitting: Emergency Medicine

## 2015-10-12 ENCOUNTER — Encounter (HOSPITAL_COMMUNITY): Payer: Self-pay

## 2015-10-12 DIAGNOSIS — E119 Type 2 diabetes mellitus without complications: Secondary | ICD-10-CM | POA: Insufficient documentation

## 2015-10-12 DIAGNOSIS — R197 Diarrhea, unspecified: Secondary | ICD-10-CM | POA: Insufficient documentation

## 2015-10-12 DIAGNOSIS — R1084 Generalized abdominal pain: Secondary | ICD-10-CM | POA: Insufficient documentation

## 2015-10-12 DIAGNOSIS — Z794 Long term (current) use of insulin: Secondary | ICD-10-CM | POA: Insufficient documentation

## 2015-10-12 DIAGNOSIS — F17200 Nicotine dependence, unspecified, uncomplicated: Secondary | ICD-10-CM | POA: Insufficient documentation

## 2015-10-12 DIAGNOSIS — Z79899 Other long term (current) drug therapy: Secondary | ICD-10-CM | POA: Insufficient documentation

## 2015-10-12 DIAGNOSIS — R112 Nausea with vomiting, unspecified: Secondary | ICD-10-CM | POA: Insufficient documentation

## 2015-10-12 LAB — CBC
HCT: 38.2 % — ABNORMAL LOW (ref 39.0–52.0)
Hemoglobin: 12.2 g/dL — ABNORMAL LOW (ref 13.0–17.0)
MCH: 24.6 pg — AB (ref 26.0–34.0)
MCHC: 31.9 g/dL (ref 30.0–36.0)
MCV: 77.2 fL — ABNORMAL LOW (ref 78.0–100.0)
PLATELETS: 239 10*3/uL (ref 150–400)
RBC: 4.95 MIL/uL (ref 4.22–5.81)
RDW: 13.7 % (ref 11.5–15.5)
WBC: 5.9 10*3/uL (ref 4.0–10.5)

## 2015-10-12 LAB — URINALYSIS, ROUTINE W REFLEX MICROSCOPIC
Bilirubin Urine: NEGATIVE
Glucose, UA: >1000 mg/dL — AB
Hgb urine dipstick: NEGATIVE
KETONES UR: NEGATIVE mg/dL
LEUKOCYTES UA: NEGATIVE
NITRITE: NEGATIVE
PH: 7 (ref 5.0–8.0)
PROTEIN: NEGATIVE mg/dL
Specific Gravity, Urine: 1.037 — ABNORMAL HIGH (ref 1.005–1.030)

## 2015-10-12 LAB — COMPREHENSIVE METABOLIC PANEL
ALK PHOS: 81 U/L (ref 38–126)
ALT: 28 U/L (ref 17–63)
AST: 29 U/L (ref 15–41)
Albumin: 3.6 g/dL (ref 3.5–5.0)
Anion gap: 10 (ref 5–15)
BUN: 11 mg/dL (ref 6–20)
CALCIUM: 9.1 mg/dL (ref 8.9–10.3)
CO2: 28 mmol/L (ref 22–32)
CREATININE: 1.13 mg/dL (ref 0.61–1.24)
Chloride: 99 mmol/L — ABNORMAL LOW (ref 101–111)
Glucose, Bld: 408 mg/dL — ABNORMAL HIGH (ref 65–99)
Potassium: 4.7 mmol/L (ref 3.5–5.1)
Sodium: 137 mmol/L (ref 135–145)
Total Bilirubin: 0.3 mg/dL (ref 0.3–1.2)
Total Protein: 6.4 g/dL — ABNORMAL LOW (ref 6.5–8.1)

## 2015-10-12 LAB — LIPASE, BLOOD: Lipase: 20 U/L (ref 11–51)

## 2015-10-12 LAB — URINE MICROSCOPIC-ADD ON: RBC / HPF: NONE SEEN RBC/hpf (ref 0–5)

## 2015-10-12 MED ORDER — SODIUM CHLORIDE 0.9 % IV BOLUS (SEPSIS): 1000.0000 mL | Freq: Once | INTRAVENOUS | Status: DC

## 2015-10-12 MED ORDER — ONDANSETRON HCL 4 MG PO TABS: 4.0000 mg | ORAL_TABLET | Freq: Once | ORAL | Status: DC

## 2015-10-12 NOTE — ED Provider Notes (Signed)
CSN: [id number]     Arrival date & time 10/12/15  1749 History  By signing my name below, I, [name], attest that this documentation has been prepared under the direction and in the presence of Cheri Fowler, PA-C. Electronically Signed: Angelene Giovanni, ED Scribe. 10/12/2015. 7:30 PM.     Chief Complaint  Patient presents with  . Abdominal Pain  . vomiting and diarrhea    The history is provided by the patient. No language interpreter was used.   HPI Comments: [name] is a 40 y.o. male with a hx of DM who presents to the Emergency Department complaining of gradually worsening generalized abdominal pain onset last night. He reports associated 5 episodes of non-bloody, non-bilious vomiting and multiple episodes of non-bloody loose stool that resolved at 3 pm today.  No alleviating factors.  No meds PTA. He denies any sick contacts. He denies any recent travels out of the country or any recent antibiotic use. He reports that he has been compliant with his DM medications (novolog 70/30), and has not missed any doses. He denies any fever, chills, SOB, dysuria, hematuria, polydipsia, polyuria, or visual disturbances.    Past Medical History  Diagnosis Date  . Diabetes mellitus     Newly diagnosed, May 2012   Past Surgical History  Procedure Laterality Date  . Appendectomy    . Tonsillectomy     Family History  Problem Relation Age of Onset  . Diabetes Sister     Several brothers and sister with DM  . Diabetes Brother   . Hypertension Mother    Social History  Substance Use Topics  . Smoking status: Current Every Day Smoker  . Smokeless tobacco: None     Comment: Smoking 1 cigs every 4 days  . Alcohol Use: No    Review of Systems  Constitutional: Negative for fever and chills.  Eyes: Negative for visual disturbance.  Respiratory: Negative for shortness of breath.   Gastrointestinal: Positive for nausea, vomiting, abdominal pain and diarrhea.  Endocrine: Negative  for polydipsia and polyuria.  Genitourinary: Negative for dysuria and hematuria.  All other systems reviewed and are negative.     Allergies  Review of patient's allergies indicates no known allergies.  Home Medications   Prior to Admission medications   Medication Sig Start Date End Date Taking? Authorizing Provider  Cholecalciferol (VITAMIN D) 2000 UNITS tablet Take 2,000 Units by mouth daily.    Historical Provider, MD  fluticasone (FLONASE) 50 MCG/ACT nasal spray Place 2 sprays into both nostrils daily. Patient not taking: Reported on 12/23/2014 12/03/14   Doris Cheadle, MD  gabapentin (NEURONTIN) 300 MG capsule Take 1 capsule (300 mg total) by mouth at bedtime. 07/20/15   Ambrose Finland, NP  glucose blood (CHOICE DM FORA G20 TEST STRIPS) test strip Use as instructed 04/08/14   Doris Cheadle, MD  insulin aspart protamine- aspart (NOVOLOG MIX 70/30) (70-30) 100 UNIT/ML injection Inject 0.19 mLs (19 Units total) into the skin 2 (two) times daily with a meal. 07/20/15   Ambrose Finland, NP  Insulin Syringe-Needle U-100 (BD INSULIN SYRINGE ULTRAFINE) 31G X 15/64" 0.5 ML MISC Please inject insulin twice per day 07/20/15   Ambrose Finland, NP  Insulin Syringe-Needle U-100 (INSULIN SYRINGE .3CC/29GX1/2") 29G X 1/2" 0.3 ML MISC Check blood sugar TID & QHS 04/08/14   Doris Cheadle, MD  Vitamin D, Ergocalciferol, (DRISDOL) 50000 UNITS CAPS capsule Take 1 capsule (50,000 Units total) by mouth every 7 (seven) days. 04/10/14  Doris Cheadle, MD   BP 135/83 mmHg  Pulse 87  Temp(Src) 98.8 F (37.1 C) (Oral)  Resp 14  SpO2 100% Physical Exam  Constitutional: He is oriented to person, place, and time. He appears well-developed and well-nourished.  Non-toxic appearance. He does not have a sickly appearance. He does not appear ill.  HENT:  Head: Normocephalic and atraumatic.  Mouth/Throat: Oropharynx is clear and moist and mucous membranes are normal.  Mucous membranes are moist.  Eyes: Conjunctivae are  normal. Pupils are equal, round, and reactive to light. No scleral icterus.  Neck: Normal range of motion. Neck supple. No tracheal deviation present.  Cardiovascular: Normal rate, regular rhythm and normal heart sounds.   No murmur heard. Pulmonary/Chest: Effort normal and breath sounds normal. No accessory muscle usage or stridor. No respiratory distress. He has no wheezes. He has no rhonchi. He has no rales.  No Kussmaul breathing.   Abdominal: Soft. Bowel sounds are normal. He exhibits no distension. There is tenderness (generalized).  Generalized abdominal pain without rebound, guarding, or rigidity.  Musculoskeletal: Normal range of motion.  Lymphadenopathy:    He has no cervical adenopathy.  Neurological: He is alert and oriented to person, place, and time.  Speech clear without dysarthria.  Skin: Skin is warm and dry.  Psychiatric: He has a normal mood and affect. His behavior is normal.    ED Course  Procedures (including critical care time) DIAGNOSTIC STUDIES: Oxygen Saturation is 100% on RA, normal by my interpretation.    COORDINATION OF CARE: 7:25 PM- Pt advised of plan for treatment and pt agrees. Pt will provide urine sample for UA. He will receive Zofran.    Labs Review Labs Reviewed  COMPREHENSIVE METABOLIC PANEL - Abnormal; Notable for the following:    Chloride 99 (*)    Glucose, Bld 408 (*)    Total Protein 6.4 (*)    All other components within normal limits  CBC - Abnormal; Notable for the following:    Hemoglobin 12.2 (*)    HCT 38.2 (*)    MCV 77.2 (*)    MCH 24.6 (*)    All other components within normal limits  LIPASE, BLOOD  URINALYSIS, ROUTINE W REFLEX MICROSCOPIC (NOT AT [facility])     Cheri Fowler, PA-C has personally reviewed and evaluated these lab results as part of her medical decision-making.   MDM   Final diagnoses:  Diarrhea, unspecified type  Non-intractable vomiting with nausea, vomiting of unspecified type    Patient presents with  generalized abdominal pain, N/V/D x 1 day consistent with viral illness.  VSS, NAD.  On exam, heart RRR, lungs CTAB, abdomen soft with generalized tenderness.  No rebound, guarding, or rigidity.  Mucous membranes are moist.  No evidence of dehydration.  Patient reports he has been compliant with DM medications.  Labs today remarkable for glucose of 408 and an anion gap of 10. Doubt DKA. UA remarkable for >1000 glucose.  Will order IVF and Zofran for symptom management.  Patient refused IVF and zofran.  Able to tolerate PO intake.  Case has been discussed with Dr. Effie Shy who agrees with the above plan for discharge.  Discussed elevated glucose levels and follow up with PCP for medical management.  Discussed return precautions.  Patient agrees and acknowledges the above plan for discharge.   I personally performed the services described in this documentation, which was scribed in my presence. The recorded information has been reviewed and is accurate.   Cheri Fowler, PA-C  10/12/15 2142  Mancel Bale, MD 10/13/15 0020

## 2015-10-12 NOTE — ED Notes (Signed)
Patient reports that he developed abdominal pain and cramping this morning. Vomiting and diarrhea with same, no distress.

## 2015-10-12 NOTE — ED Notes (Signed)
IV attempted x's 1 without success. 

## 2015-10-12 NOTE — Discharge Instructions (Signed)
Diarrhea °Diarrhea is frequent loose and watery bowel movements. It can cause you to feel weak and dehydrated. Dehydration can cause you to become tired and thirsty, have a dry mouth, and have decreased urination that often is dark yellow. Diarrhea is a sign of another problem, most often an infection that will not last long. In most cases, diarrhea typically lasts 2-3 days. However, it can last longer if it is a sign of something more serious. It is important to treat your diarrhea as directed by your caregiver to lessen or prevent future episodes of diarrhea. °CAUSES  °Some common causes include: °· Gastrointestinal infections caused by viruses, bacteria, or parasites. °· Food poisoning or food allergies. °· Certain medicines, such as antibiotics, chemotherapy, and laxatives. °· Artificial sweeteners and fructose. °· Digestive disorders. °HOME CARE INSTRUCTIONS °· Ensure adequate fluid intake (hydration): Have 1 cup (8 oz) of fluid for each diarrhea episode. Avoid fluids that contain simple sugars or sports drinks, fruit juices, whole milk products, and sodas. Your urine should be clear or pale yellow if you are drinking enough fluids. Hydrate with an oral rehydration solution that you can purchase at pharmacies, retail stores, and online. You can prepare an oral rehydration solution at home by mixing the following ingredients together: °·  - tsp table salt. °· ¾ tsp baking soda. °·  tsp salt substitute containing potassium chloride. °· 1  tablespoons sugar. °· 1 L (34 oz) of water. °· Certain foods and beverages may increase the speed at which food moves through the gastrointestinal (GI) tract. These foods and beverages should be avoided and include: °· Caffeinated and alcoholic beverages. °· High-fiber foods, such as raw fruits and vegetables, nuts, seeds, and whole grain breads and cereals. °· Foods and beverages sweetened with sugar alcohols, such as xylitol, sorbitol, and mannitol. °· Some foods may be well  tolerated and may help thicken stool including: °· Starchy foods, such as rice, toast, pasta, low-sugar cereal, oatmeal, grits, baked potatoes, crackers, and bagels. °· Bananas. °· Applesauce. °· Add probiotic-rich foods to help increase healthy bacteria in the GI tract, such as yogurt and fermented milk products. °· Wash your hands well after each diarrhea episode. °· Only take over-the-counter or prescription medicines as directed by your caregiver. °· Take a warm bath to relieve any burning or pain from frequent diarrhea episodes. °SEEK IMMEDIATE MEDICAL CARE IF:  °· You are unable to keep fluids down. °· You have persistent vomiting. °· You have blood in your stool, or your stools are black and tarry. °· You do not urinate in 6-8 hours, or there is only a small amount of very dark urine. °· You have abdominal pain that increases or localizes. °· You have weakness, dizziness, confusion, or light-headedness. °· You have a severe headache. °· Your diarrhea gets worse or does not get better. °· You have a fever or persistent symptoms for more than 2-3 days. °· You have a fever and your symptoms suddenly get worse. °MAKE SURE YOU:  °· Understand these instructions. °· Will watch your condition. °· Will get help right away if you are not doing well or get worse. °  °This information is not intended to replace advice given to you by your health care provider. Make sure you discuss any questions you have with your health care provider. °  °Document Released: 08/26/2002 Document Revised: 09/26/2014 Document Reviewed: 05/13/2012 °Elsevier Interactive Patient Education ©2016 [employer] ° °Food Choices to Help Relieve Diarrhea, Adult °When you have diarrhea,   the foods you eat and your eating habits are very important. Choosing the right foods and drinks can help relieve diarrhea. Also, because diarrhea can last up to 7 days, you need to replace lost fluids and electrolytes (such as sodium, potassium, and chloride) in  order to help prevent dehydration.  °WHAT GENERAL GUIDELINES DO I NEED TO FOLLOW? °· Slowly drink 1 cup (8 oz) of fluid for each episode of diarrhea. If you are getting enough fluid, your urine will be clear or pale yellow. °· Eat starchy foods. Some good choices include white rice, white toast, pasta, low-fiber cereal, baked potatoes (without the skin), saltine crackers, and bagels. °· Avoid large servings of any cooked vegetables. °· Limit fruit to two servings per day. A serving is ½ cup or 1 small piece. °· Choose foods with less than 2 g of fiber per serving. °· Limit fats to less than 8 tsp (38 g) per day. °· Avoid fried foods. °· Eat foods that have probiotics in them. Probiotics can be found in certain dairy products. °· Avoid foods and beverages that may increase the speed at which food moves through the stomach and intestines (gastrointestinal tract). Things to avoid include: °· High-fiber foods, such as dried fruit, raw fruits and vegetables, nuts, seeds, and whole grain foods. °· Spicy foods and high-fat foods. °· Foods and beverages sweetened with high-fructose corn syrup, honey, or sugar alcohols such as xylitol, sorbitol, and mannitol. °WHAT FOODS ARE RECOMMENDED? °Grains °White rice. White, French, or pita breads (fresh or toasted), including plain rolls, buns, or bagels. White pasta. Saltine, soda, or graham crackers. Pretzels. Low-fiber cereal. Cooked cereals made with water (such as cornmeal, farina, or cream cereals). Plain muffins. Matzo. Melba toast. Zwieback.  °Vegetables °Potatoes (without the skin). Strained tomato and vegetable juices. Most well-cooked and canned vegetables without seeds. Tender lettuce. °Fruits °Cooked or canned applesauce, apricots, cherries, fruit cocktail, grapefruit, peaches, pears, or plums. Fresh bananas, apples without skin, cherries, grapes, cantaloupe, grapefruit, peaches, oranges, or plums.  °Meat and Other Protein Products °Baked or boiled chicken. Eggs. Tofu.  Fish. Seafood. Smooth peanut butter. Ground or well-cooked tender beef, ham, veal, lamb, pork, or poultry.  °Dairy °Plain yogurt, kefir, and unsweetened liquid yogurt. Lactose-free milk, buttermilk, or soy milk. Plain hard cheese. °Beverages °Sport drinks. Clear broths. Diluted fruit juices (except prune). Regular, caffeine-free sodas such as ginger ale. Water. Decaffeinated teas. Oral rehydration solutions. Sugar-free beverages not sweetened with sugar alcohols. °Other °Bouillon, broth, or soups made from recommended foods.  °The items listed above may not be a complete list of recommended foods or beverages. Contact your dietitian for more options. °WHAT FOODS ARE NOT RECOMMENDED? °Grains °Whole grain, whole wheat, bran, or rye breads, rolls, pastas, crackers, and cereals. Wild or brown rice. Cereals that contain more than 2 g of fiber per serving. Corn tortillas or taco shells. Cooked or dry oatmeal. Granola. Popcorn. °Vegetables °Raw vegetables. Cabbage, broccoli, Brussels sprouts, artichokes, baked beans, beet greens, corn, kale, legumes, peas, sweet potatoes, and yams. Potato skins. Cooked spinach and cabbage. °Fruits °Dried fruit, including raisins and dates. Raw fruits. Stewed or dried prunes. Fresh apples with skin, apricots, mangoes, pears, raspberries, and strawberries.  °Meat and Other Protein Products °Chunky peanut butter. Nuts and seeds. Beans and lentils. Bacon.  °Dairy °High-fat cheeses. Milk, chocolate milk, and beverages made with milk, such as milk shakes. Cream. Ice cream. °Sweets and Desserts °Sweet rolls, doughnuts, and sweet breads. Pancakes and waffles. °Fats and Oils °Butter. Cream sauces. Margarine.   Salad oils. Plain salad dressings. Olives. Avocados.  °Beverages °Caffeinated beverages (such as coffee, tea, soda, or energy drinks). Alcoholic beverages. Fruit juices with pulp. Prune juice. Soft drinks sweetened with high-fructose corn syrup or sugar alcohols. °Other °Coconut. Hot sauce.  Chili powder. Mayonnaise. Gravy. Cream-based or milk-based soups.  °The items listed above may not be a complete list of foods and beverages to avoid. Contact your dietitian for more information. °WHAT SHOULD I DO IF I BECOME DEHYDRATED? °Diarrhea can sometimes lead to dehydration. Signs of dehydration include dark urine and dry mouth and skin. If you think you are dehydrated, you should rehydrate with an oral rehydration solution. These solutions can be purchased at pharmacies, retail stores, or online.  °Drink ½-1 cup (120-240 mL) of oral rehydration solution each time you have an episode of diarrhea. If drinking this amount makes your diarrhea worse, try drinking smaller amounts more often. For example, drink 1-3 tsp (5-15 mL) every 5-10 minutes.  °A general rule for staying hydrated is to drink 1½-2 L of fluid per day. Talk to your health care provider about the specific amount you should be drinking each day. Drink enough fluids to keep your urine clear or pale yellow. °  °This information is not intended to replace advice given to you by your health care provider. Make sure you discuss any questions you have with your health care provider. °  °Document Released: 11/26/2003 Document Revised: 09/26/2014 Document Reviewed: 07/29/2013 °Elsevier Interactive Patient Education ©2016 [employer] ° °Nausea and Vomiting °Nausea is a sick feeling that often comes before throwing up (vomiting). Vomiting is a reflex where stomach contents come out of your mouth. Vomiting can cause severe loss of body fluids (dehydration). Children and elderly adults can become dehydrated quickly, especially if they also have diarrhea. Nausea and vomiting are symptoms of a condition or disease. It is important to find the cause of your symptoms. °CAUSES  °· Direct irritation of the stomach lining. This irritation can result from increased acid production (gastroesophageal reflux disease), infection, food poisoning, taking certain  medicines (such as nonsteroidal anti-inflammatory drugs), alcohol use, or tobacco use. °· Signals from the brain. These signals could be caused by a headache, heat exposure, an inner ear disturbance, increased pressure in the brain from injury, infection, a tumor, or a concussion, pain, emotional stimulus, or metabolic problems. °· An obstruction in the gastrointestinal tract (bowel obstruction). °· Illnesses such as diabetes, hepatitis, gallbladder problems, appendicitis, kidney problems, cancer, sepsis, atypical symptoms of a heart attack, or eating disorders. °· Medical treatments such as chemotherapy and radiation. °· Receiving medicine that makes you sleep (general anesthetic) during surgery. °DIAGNOSIS °Your caregiver may ask for tests to be done if the problems do not improve after a few days. Tests may also be done if symptoms are severe or if the reason for the nausea and vomiting is not clear. Tests may include: °· Urine tests. °· Blood tests. °· Stool tests. °· Cultures (to look for evidence of infection). °· X-rays or other imaging studies. °Test results can help your caregiver make decisions about treatment or the need for additional tests. °TREATMENT °You need to stay well hydrated. Drink frequently but in small amounts. You may wish to drink water, sports drinks, clear broth, or eat frozen ice pops or gelatin dessert to help stay hydrated. When you eat, eating slowly may help prevent nausea. There are also some antinausea medicines that may help prevent nausea. °HOME CARE INSTRUCTIONS  °· Take all medicine as directed by your caregiver. °·   If you do not have an appetite, do not force yourself to eat. However, you must continue to drink fluids. °· If you have an appetite, eat a normal diet unless your caregiver tells you differently. °¨ Eat a variety of complex carbohydrates (rice, wheat, potatoes, bread), lean meats, yogurt, fruits, and vegetables. °¨ Avoid high-fat foods because they are more  difficult to digest. °· Drink enough water and fluids to keep your urine clear or pale yellow. °· If you are dehydrated, ask your caregiver for specific rehydration instructions. Signs of dehydration may include: °¨ Severe thirst. °¨ Dry lips and mouth. °¨ Dizziness. °¨ Dark urine. °¨ Decreasing urine frequency and amount. °¨ Confusion. °¨ Rapid breathing or pulse. °SEEK IMMEDIATE MEDICAL CARE IF:  °· You have blood or brown flecks (like coffee grounds) in your vomit. °· You have black or bloody stools. °· You have a severe headache or stiff neck. °· You are confused. °· You have severe abdominal pain. °· You have chest pain or trouble breathing. °· You do not urinate at least once every 8 hours. °· You develop cold or clammy skin. °· You continue to vomit for longer than 24 to 48 hours. °· You have a fever. °MAKE SURE YOU:  °· Understand these instructions. °· Will watch your condition. °· Will get help right away if you are not doing well or get worse. °  °This information is not intended to replace advice given to you by your health care provider. Make sure you discuss any questions you have with your health care provider. °  °Document Released: 09/05/2005 Document Revised: 11/28/2011 Document Reviewed: 02/02/2011 °Elsevier Interactive Patient Education ©2016 [employer] ° °

## 2015-12-15 ENCOUNTER — Encounter (HOSPITAL_COMMUNITY): Payer: Self-pay | Admitting: Emergency Medicine

## 2015-12-15 ENCOUNTER — Emergency Department (HOSPITAL_COMMUNITY)
Admission: EM | Admit: 2015-12-15 | Discharge: 2015-12-15 | Disposition: A | Discharge status: Home / Self Care | Payer: Self-pay | Attending: Emergency Medicine | Admitting: Emergency Medicine

## 2015-12-15 DIAGNOSIS — R51 Headache: Secondary | ICD-10-CM | POA: Insufficient documentation

## 2015-12-15 DIAGNOSIS — E1165 Type 2 diabetes mellitus with hyperglycemia: Secondary | ICD-10-CM | POA: Insufficient documentation

## 2015-12-15 DIAGNOSIS — J029 Acute pharyngitis, unspecified: Secondary | ICD-10-CM | POA: Insufficient documentation

## 2015-12-15 DIAGNOSIS — F17200 Nicotine dependence, unspecified, uncomplicated: Secondary | ICD-10-CM | POA: Insufficient documentation

## 2015-12-15 DIAGNOSIS — R05 Cough: Secondary | ICD-10-CM | POA: Insufficient documentation

## 2015-12-15 DIAGNOSIS — M791 Myalgia: Secondary | ICD-10-CM | POA: Insufficient documentation

## 2015-12-15 LAB — BASIC METABOLIC PANEL
Anion gap: 9 (ref 5–15)
BUN: 7 mg/dL (ref 6–20)
CALCIUM: 9 mg/dL (ref 8.9–10.3)
CO2: 23 mmol/L (ref 22–32)
CREATININE: 1.02 mg/dL (ref 0.61–1.24)
Chloride: 103 mmol/L (ref 101–111)
Glucose, Bld: 384 mg/dL — ABNORMAL HIGH (ref 65–99)
Potassium: 3.9 mmol/L (ref 3.5–5.1)
SODIUM: 135 mmol/L (ref 135–145)

## 2015-12-15 LAB — CBG MONITORING, ED: GLUCOSE-CAPILLARY: 366 mg/dL — AB (ref 65–99)

## 2015-12-15 LAB — CBC
HCT: 40 % (ref 39.0–52.0)
Hemoglobin: 12.7 g/dL — ABNORMAL LOW (ref 13.0–17.0)
MCH: 24.3 pg — ABNORMAL LOW (ref 26.0–34.0)
MCHC: 31.8 g/dL (ref 30.0–36.0)
MCV: 76.6 fL — ABNORMAL LOW (ref 78.0–100.0)
PLATELETS: 217 10*3/uL (ref 150–400)
RBC: 5.22 MIL/uL (ref 4.22–5.81)
RDW: 13.4 % (ref 11.5–15.5)
WBC: 5.1 10*3/uL (ref 4.0–10.5)

## 2015-12-15 NOTE — ED Notes (Signed)
CBG [id number] 

## 2015-12-15 NOTE — ED Notes (Signed)
Pt states he also needs to be seen for his blood sugar

## 2015-12-15 NOTE — ED Notes (Signed)
Pt states "it hurts when i talk, i've had a headache for x3 days" Pt feels achey all over. Pt c/o cough.

## 2015-12-15 NOTE — ED Notes (Signed)
Pt states "I'm going to come back in the morning" Pt encouraged to stay. Pt ambulated out of ER without any issue.

## 2015-12-16 ENCOUNTER — Emergency Department (HOSPITAL_COMMUNITY): Payer: Self-pay

## 2015-12-16 ENCOUNTER — Emergency Department (HOSPITAL_COMMUNITY)
Admission: EM | Admit: 2015-12-16 | Discharge: 2015-12-16 | Disposition: A | Discharge status: Home / Self Care | Payer: Self-pay | Attending: Emergency Medicine | Admitting: Emergency Medicine

## 2015-12-16 ENCOUNTER — Encounter (HOSPITAL_COMMUNITY): Payer: Self-pay | Admitting: *Deleted

## 2015-12-16 DIAGNOSIS — E119 Type 2 diabetes mellitus without complications: Secondary | ICD-10-CM | POA: Insufficient documentation

## 2015-12-16 DIAGNOSIS — Z79899 Other long term (current) drug therapy: Secondary | ICD-10-CM | POA: Insufficient documentation

## 2015-12-16 DIAGNOSIS — J069 Acute upper respiratory infection, unspecified: Secondary | ICD-10-CM | POA: Insufficient documentation

## 2015-12-16 DIAGNOSIS — J029 Acute pharyngitis, unspecified: Secondary | ICD-10-CM

## 2015-12-16 DIAGNOSIS — F17200 Nicotine dependence, unspecified, uncomplicated: Secondary | ICD-10-CM | POA: Insufficient documentation

## 2015-12-16 DIAGNOSIS — Z794 Long term (current) use of insulin: Secondary | ICD-10-CM | POA: Insufficient documentation

## 2015-12-16 DIAGNOSIS — B9789 Other viral agents as the cause of diseases classified elsewhere: Secondary | ICD-10-CM

## 2015-12-16 LAB — RAPID STREP SCREEN (MED CTR MEBANE ONLY): Streptococcus, Group A Screen (Direct): NEGATIVE

## 2015-12-16 NOTE — ED Notes (Signed)
Pt up to restroom.

## 2015-12-16 NOTE — ED Notes (Signed)
No nausea vomiting or diarrhea 

## 2015-12-16 NOTE — Discharge Instructions (Signed)
It was our pleasure to provide your ER care today - we hope that you feel better.  Your chest xray and strep test are negative.  Your symptoms are most like due to a viral illness (i.e. Cold or flu-like illness), which should run its course and get better in the next few days.   Rest. Drink plenty of fluids.  Take mucinex or nyquil as need for symptom relief.  May use throat lozenges as need for sore throat.  Follow up with primary care doctor in 1 week if symptoms fail to improve/resolve.  Return to ER if worse, new symptoms, trouble breathing, unable to swallow, other concern.    Upper Respiratory Infection, Adult Most upper respiratory infections (URIs) are a viral infection of the air passages leading to the lungs. A URI affects the nose, throat, and upper air passages. The most common type of URI is nasopharyngitis and is typically referred to as "the common cold." URIs run their course and usually go away on their own. Most of the time, a URI does not require medical attention, but sometimes a bacterial infection in the upper airways can follow a viral infection. This is called a secondary infection. Sinus and middle ear infections are common types of secondary upper respiratory infections. Bacterial pneumonia can also complicate a URI. A URI can worsen asthma and chronic obstructive pulmonary disease (COPD). Sometimes, these complications can require emergency medical care and may be life threatening.  CAUSES Almost all URIs are caused by viruses. A virus is a type of germ and can spread from one person to another.  RISKS FACTORS You may be at risk for a URI if:   You smoke.   You have chronic heart or lung disease.  You have a weakened defense (immune) system.   You are very young or very old.   You have nasal allergies or asthma.  You work in crowded or poorly ventilated areas.  You work in health care facilities or schools. SIGNS AND SYMPTOMS  Symptoms typically  develop 2-3 days after you come in contact with a cold virus. Most viral URIs last 7-10 days. However, viral URIs from the influenza virus (flu virus) can last 14-18 days and are typically more severe. Symptoms may include:   Runny or stuffy (congested) nose.   Sneezing.   Cough.   Sore throat.   Headache.   Fatigue.   Fever.   Loss of appetite.   Pain in your forehead, behind your eyes, and over your cheekbones (sinus pain).  Muscle aches.  DIAGNOSIS  Your health care provider may diagnose a URI by:  Physical exam.  Tests to check that your symptoms are not due to another condition such as:  Strep throat.  Sinusitis.  Pneumonia.  Asthma. TREATMENT  A URI goes away on its own with time. It cannot be cured with medicines, but medicines may be prescribed or recommended to relieve symptoms. Medicines may help:  Reduce your fever.  Reduce your cough.  Relieve nasal congestion. HOME CARE INSTRUCTIONS   Take medicines only as directed by your health care provider.   Gargle warm saltwater or take cough drops to comfort your throat as directed by your health care provider.  Use a warm mist humidifier or inhale steam from a shower to increase air moisture. This may make it easier to breathe.  Drink enough fluid to keep your urine clear or pale yellow.   Eat soups and other clear broths and maintain good nutrition.  Rest as needed.   Return to work when your temperature has returned to normal or as your health care provider advises. You may need to stay home longer to avoid infecting others. You can also use a face mask and careful hand washing to prevent spread of the virus.  Increase the usage of your inhaler if you have asthma.   Do not use any tobacco products, including cigarettes, chewing tobacco, or electronic cigarettes. If you need help quitting, ask your health care provider. PREVENTION  The best way to protect yourself from getting a cold  is to practice good hygiene.   Avoid oral or hand contact with people with cold symptoms.   Wash your hands often if contact occurs.  There is no clear evidence that vitamin C, vitamin E, echinacea, or exercise reduces the chance of developing a cold. However, it is always recommended to get plenty of rest, exercise, and practice good nutrition.  SEEK MEDICAL CARE IF:   You are getting worse rather than better.   Your symptoms are not controlled by medicine.   You have chills.  You have worsening shortness of breath.  You have brown or red mucus.  You have yellow or brown nasal discharge.  You have pain in your face, especially when you bend forward.  You have a fever.  You have swollen neck glands.  You have pain while swallowing.  You have white areas in the back of your throat. SEEK IMMEDIATE MEDICAL CARE IF:   You have severe or persistent:  Headache.  Ear pain.  Sinus pain.  Chest pain.  You have chronic lung disease and any of the following:  Wheezing.  Prolonged cough.  Coughing up blood.  A change in your usual mucus.  You have a stiff neck.  You have changes in your:  Vision.  Hearing.  Thinking.  Mood. MAKE SURE YOU:   Understand these instructions.  Will watch your condition.  Will get help right away if you are not doing well or get worse.   This information is not intended to replace advice given to you by your health care provider. Make sure you discuss any questions you have with your health care provider.   Document Released: 03/01/2001 Document Revised: 01/20/2015 Document Reviewed: 12/11/2013 Elsevier Interactive Patient Education 2016 [employer]     Cough, Adult A cough helps to clear your throat and lungs. A cough may last only 2-3 weeks (acute), or it may last longer than 8 weeks (chronic). Many different things can cause a cough. A cough may be a sign of an illness or another medical condition. HOME  CARE  Pay attention to any changes in your cough.  Take medicines only as told by your doctor.  If you were prescribed an antibiotic medicine, take it as told by your doctor. Do not stop taking it even if you start to feel better.  Talk with your doctor before you try using a cough medicine.  Drink enough fluid to keep your pee (urine) clear or pale yellow.  If the air is dry, use a cold steam vaporizer or humidifier in your home.  Stay away from things that make you cough at work or at home.  If your cough is worse at night, try using extra pillows to raise your head up higher while you sleep.  Do not smoke, and try not to be around smoke. If you need help quitting, ask your doctor.  Do not have caffeine.  Do  not drink alcohol.  Rest as needed. GET HELP IF:  You have new problems (symptoms).  You cough up yellow fluid (pus).  Your cough does not get better after 2-3 weeks, or your cough gets worse.  Medicine does not help your cough and you are not sleeping well.  You have pain that gets worse or pain that is not helped with medicine.  You have a fever.  You are losing weight and you do not know why.  You have night sweats. GET HELP RIGHT AWAY IF:  You cough up blood.  You have trouble breathing.  Your heartbeat is very fast.   This information is not intended to replace advice given to you by your health care provider. Make sure you discuss any questions you have with your health care provider.   Document Released: 05/19/2011 Document Revised: 05/27/2015 Document Reviewed: 11/12/2014 Elsevier Interactive Patient Education 2016 [employer]    Pharyngitis Pharyngitis is a sore throat (pharynx). There is redness, pain, and swelling of your throat. HOME CARE   Drink enough fluids to keep your pee (urine) clear or pale yellow.  Only take medicine as told by your doctor.  You may get sick again if you do not take medicine as told. Finish your medicines,  even if you start to feel better.  Do not take aspirin.  Rest.  Rinse your mouth (gargle) with salt water ( tsp of salt per 1 qt of water) every 1-2 hours. This will help the pain.  If you are not at risk for choking, you can suck on hard candy or sore throat lozenges. GET HELP IF:  You have large, tender lumps on your neck.  You have a rash.  You cough up green, yellow-brown, or bloody spit. GET HELP RIGHT AWAY IF:   You have a stiff neck.  You drool or cannot swallow liquids.  You throw up (vomit) or are not able to keep medicine or liquids down.  You have very bad pain that does not go away with medicine.  You have problems breathing (not from a stuffy nose). MAKE SURE YOU:   Understand these instructions.  Will watch your condition.  Will get help right away if you are not doing well or get worse.   This information is not intended to replace advice given to you by your health care provider. Make sure you discuss any questions you have with your health care provider.   Document Released: 02/22/2008 Document Revised: 06/26/2013 Document Reviewed: 05/13/2013 Else[employer] Interactive Patient Education [employer]

## 2015-12-16 NOTE — ED Provider Notes (Signed)
CSN: [id number]     Arrival date & time 12/16/15  1427 History   First MD Initiated Contact with Patient 12/16/15 1914     Chief Complaint  Patient presents with  . multiple complaints      (Consider location/radiation/quality/duration/timing/severity/associated sxs/prior Treatment) The history is provided by the patient.  Patient c/o sore throat onset 3 days ago. Constant, bilateral. Moderate. Is able to swallow, but painful.  In past couple days patient notes increased cough. occ productive. Mild sob. No chest pain except with hard coughing spell. No body aches. Subjective fever. No known ill contacts. No sinus drainage or pressure. No vomiting or diarrhea.no rash. No known specific ill contacts.      Past Medical History  Diagnosis Date  . Diabetes mellitus     Newly diagnosed, May 2012   Past Surgical History  Procedure Laterality Date  . Appendectomy    . Tonsillectomy     Family History  Problem Relation Age of Onset  . Diabetes Sister     Several brothers and sister with DM  . Diabetes Brother   . Hypertension Mother    Social History  Substance Use Topics  . Smoking status: Current Every Day Smoker  . Smokeless tobacco: None     Comment: Smoking 1 cigs every 4 days  . Alcohol Use: No    Review of Systems  Constitutional: Positive for fever.  HENT: Positive for sore throat. Negative for sinus pressure.   Eyes: Negative for discharge and redness.  Respiratory: Positive for cough.   Gastrointestinal: Negative for vomiting and diarrhea.  Musculoskeletal: Negative for neck pain and neck stiffness.  Skin: Negative for rash.  Neurological: Positive for headaches.      Allergies  Review of patient's allergies indicates no known allergies.  Home Medications   Prior to Admission medications   Medication Sig Start Date End Date Taking? Authorizing Provider  Cholecalciferol (VITAMIN D) 2000 UNITS tablet Take 2,000 Units by mouth daily.    Historical Provider,  MD  fluticasone (FLONASE) 50 MCG/ACT nasal spray Place 2 sprays into both nostrils daily. Patient not taking: Reported on 12/23/2014 12/03/14   Doris Cheadle, MD  gabapentin (NEURONTIN) 300 MG capsule Take 1 capsule (300 mg total) by mouth at bedtime. 07/20/15   Ambrose Finland, NP  glucose blood (CHOICE DM FORA G20 TEST STRIPS) test strip Use as instructed 04/08/14   Doris Cheadle, MD  insulin aspart protamine- aspart (NOVOLOG MIX 70/30) (70-30) 100 UNIT/ML injection Inject 0.19 mLs (19 Units total) into the skin 2 (two) times daily with a meal. 07/20/15   Ambrose Finland, NP  Insulin Syringe-Needle U-100 (BD INSULIN SYRINGE ULTRAFINE) 31G X 15/64" 0.5 ML MISC Please inject insulin twice per day 07/20/15   Ambrose Finland, NP  Insulin Syringe-Needle U-100 (INSULIN SYRINGE .3CC/29GX1/2") 29G X 1/2" 0.3 ML MISC Check blood sugar TID & QHS 04/08/14   Doris Cheadle, MD  Vitamin D, Ergocalciferol, (DRISDOL) 50000 UNITS CAPS capsule Take 1 capsule (50,000 Units total) by mouth every 7 (seven) days. 04/10/14   Doris Cheadle, MD   BP 125/86 mmHg  Pulse 109  Temp(Src) 98.3 F (36.8 C)  Resp 18  Ht 6' (1.829 m)  Wt 60.782 kg  BMI 18.17 kg/m2  SpO2 100% Physical Exam  Constitutional: He is oriented to person, place, and time. He appears well-developed and well-nourished. No distress.  HENT:  Pharynx erythematous, no asymmetric swelling or abscess. No trismus.   Eyes: Pupils are equal, round,  and reactive to light. No scleral icterus.  Neck: Neck supple. No tracheal deviation present.  No stiffness or rigidity  Cardiovascular: Normal rate, regular rhythm, normal heart sounds and intact distal pulses.   No murmur heard. Pulmonary/Chest: Effort normal. No accessory muscle usage. No respiratory distress. He has no wheezes.  Coughing, upper resp congestion.   Abdominal: Soft. He exhibits no distension. There is no tenderness.  No hsm  Genitourinary:  No cva tenderness  Musculoskeletal: Normal range of  motion.  Lymphadenopathy:    He has no cervical adenopathy.  Neurological: He is alert and oriented to person, place, and time.  Skin: Skin is warm and dry. No rash noted. He is not diaphoretic.  Psychiatric: He has a normal mood and affect.  Nursing note and vitals reviewed.   ED Course  Procedures (including critical care time) Labs Review  Results for orders placed or performed during the hospital encounter of 12/16/15  Rapid st[facility] screen ([facility])  Result Value Ref Range   Streptococcus, Group A Screen (Direct) NEGATIVE NEGATIVE   Dg Chest 2 View  12/16/2015  CLINICAL DATA:  Cough and congestion.  Chest pain EXAM: CHEST  2 VIEW COMPARISON:  March 29, 2014 FINDINGS: Lungs are clear. Heart size and pulmonary vascularity are normal. No adenopathy. No bone lesions. No pneumothorax. IMPRESSION: No edema or consolidation. Electronically Signed   By: Bretta BangWilliam  Woodruff III M.D.   On: 12/16/2015 20:31      I have personally reviewed and evaluated these images and lab results as part of my medical decision-making.   MDM   Strep screen.  Reviewed nursing notes and prior charts for additional history.   Motrin po.   Above results noted - given that, and based on hx/exam findings, feel most likely symptoms are due to viral uri.   Pt currently appears stable for d/c.       Cathren LaineKevin Justis Closser, MD 12/16/15 [phone]

## 2015-12-16 NOTE — ED Notes (Signed)
The pt has had a headache maybe a temp  Coughing sore throat body aches for 3-4 days.  He was here yesterday but he left without being seen

## 2015-12-19 LAB — CULTURE, GROUP A STREP (THRC)

## 2016-01-15 ENCOUNTER — Ambulatory Visit: Payer: Self-pay | Attending: Family Medicine | Admitting: Clinical

## 2016-01-15 DIAGNOSIS — Z719 Counseling, unspecified: Secondary | ICD-10-CM

## 2016-01-15 MED FILL — GABAPENTIN 300 MG CAPSULE: 300 | 30 days supply | Qty: 30 | Fill #2

## 2016-01-15 NOTE — Progress Notes (Signed)
ASSESSMENT: Pt experiencing current need for health education and counseling. Pt would benefit from brief therapeutic interventions with and community resources.  Stage of Change: contemplative  PLAN: 1. F/U with behavioral health consultant in no f/u  2. Psychiatric Medications: none. 3. Behavioral recommendation(s):   -Take food box home today -Receive call about food program within next two weeks -Walk-in Social Security office to begin disability application SUBJECTIVE: Pt. referred by Dr Hyman HopesJegede NP for psychosocial:  Pt. reports the following symptoms/concerns: Pt states that his diabetes is causing him to be unable to work, and he wants to find out how to apply for disability; food insecurity also an issue, and affecting ability to manage diabetes. Duration of problem: At least one month Severity: mild  OBJECTIVE: Orientation & Cognition: Oriented x3. Thought processes normal and appropriate to situation. Mood: appropriate. Affect: appropriate Appearance: appropriate Risk of harm to self or others: no known risk of harm to self or others Substance use: no  Assessments administered: PHQ2: 0  Diagnosis: Health education/counseling CPT Code: Z71.9 -------------------------------------------- Other(s) present in the room: mother, 3yo son  Time spent with patient in exam room: 30 minutes   Dep[facility] screen [facility] 07/20/2015  Decreased Interest 0  Down, Depressed, Hopeless 0  PHQ - 2 Score 0

## 2016-01-25 ENCOUNTER — Ambulatory Visit: Payer: Self-pay | Attending: Internal Medicine

## 2016-01-26 ENCOUNTER — Encounter: Payer: Self-pay | Admitting: Internal Medicine

## 2016-01-26 ENCOUNTER — Ambulatory Visit: Payer: Self-pay | Attending: Internal Medicine | Admitting: Internal Medicine

## 2016-01-26 VITALS — BP 127/87 | HR 102 | Temp 97.9°F | Wt 135.8 lb

## 2016-01-26 DIAGNOSIS — E559 Vitamin D deficiency, unspecified: Secondary | ICD-10-CM | POA: Insufficient documentation

## 2016-01-26 DIAGNOSIS — Z79899 Other long term (current) drug therapy: Secondary | ICD-10-CM | POA: Insufficient documentation

## 2016-01-26 DIAGNOSIS — Z87891 Personal history of nicotine dependence: Secondary | ICD-10-CM | POA: Insufficient documentation

## 2016-01-26 DIAGNOSIS — E1169 Type 2 diabetes mellitus with other specified complication: Secondary | ICD-10-CM | POA: Insufficient documentation

## 2016-01-26 DIAGNOSIS — E1110 Type 2 diabetes mellitus with ketoacidosis without coma: Secondary | ICD-10-CM | POA: Insufficient documentation

## 2016-01-26 DIAGNOSIS — E440 Moderate protein-calorie malnutrition: Secondary | ICD-10-CM | POA: Insufficient documentation

## 2016-01-26 DIAGNOSIS — E1300 Other specified diabetes mellitus with hyperosmolarity without nonketotic hyperglycemic-hyperosmolar coma (NKHHC): Secondary | ICD-10-CM

## 2016-01-26 LAB — POCT GLYCOSYLATED HEMOGLOBIN (HGB A1C): Hemoglobin A1C: 12.7

## 2016-01-26 LAB — GLUCOSE, POCT (MANUAL RESULT ENTRY): POC Glucose: 239 mg/dl — AB (ref 70–99)

## 2016-01-26 MED ORDER — INSULIN ASPART PROT & ASPART (70-30 MIX) 100 UNIT/ML ~~LOC~~ SUSP: 25.0000 [IU] | Freq: Two times a day (BID) | SUBCUTANEOUS | Status: DC

## 2016-01-26 NOTE — Progress Notes (Signed)
[name], is a 40 y.o. male  ZOX:[id number]  [id number]  DOB - [birth year]  CC:  Chief Complaint  Patient presents with  . Establish Care    DM       HPI: [name] is a 40 y.o. male  w/ PMHX of uncontrolled DM2 w/ polyneuropathy,   here today to establish medical care. Last seen in clinic 10/16.  He states he has been doing better with his diet and diabetes.  Average range for him is 150-250s.  Once glucose gets less than 130s or so, he gest tremulous/shakes/diaphoretic.  He has been dosing himself Nph 70/30 15- 17 units bid, depending on how he feels.  Denies any weight loss/weight gain, has been thin for long time.  He denies smoking currently, but a former smoker.    His mother is present w/ him today.  Pt has been able to get connected w/ food back for fresh foods recently, but has transportation issues.  Per mom, she recently lost a daughter to diabetes complications.    +occassional feet tingling /numbness, but tolerable w/ neurontin at night sometimes.   Last eye exam about 1 year ago per pt.   Patient has No headache, No chest pain, No abdominal pain - No Nausea, No new weakness tingling or numbness, No Cough - SOB.  No Known Allergies Past Medical History  Diagnosis Date  . Diabetes mellitus     Newly diagnosed, May 2012   Current Outpatient Prescriptions on File Prior to Visit  Medication Sig Dispense Refill  . Cholecalciferol (VITAMIN D) 2000 UNITS tablet Take 2,000 Units by mouth daily.    Marland Kitchen gabapentin (NEURONTIN) 300 MG capsule Take 1 capsule (300 mg total) by mouth at bedtime. 30 capsule 4  . Vitamin D, Ergocalciferol, (DRISDOL) 50000 UNITS CAPS capsule Take 1 capsule (50,000 Units total) by mouth every 7 (seven) days. (Patient not taking: Reported on 01/26/2016) 12 capsule 0   No current facility-administered medications on file prior to visit.   Family History  Problem Relation Age of Onset  . Diabetes Sister     Several brothers and sister  with DM  . Diabetes Brother   . Hypertension Mother    Social History   Social History  . Marital Status: Single    Spouse Name: N/A  . Number of Children: N/A  . Years of Education: N/A   Occupational History  . Not on file.   Social History Main Topics  . Smoking status: Former Smoker    Quit date: 10/20/2015  . Smokeless tobacco: Former Neurosurgeon    Quit date: 10/20/2015     Comment: Smoking 1 cigs every 4 days  . Alcohol Use: No  . Drug Use: Yes    Special: Marijuana     Comment: last use 2-3 days ago  . Sexual Activity: Not on file   Other Topics Concern  . Not on file   Social History Narrative   Currently lives with his parents in [place]. Works at [employer]. Denies any current alcohol, tobacco or illicit drug use.          Review of Systems: Constitutional: Negative for fever, chills, diaphoresis, activity change, appetite change and fatigue. HENT: Negative for ear pain, nosebleeds, congestion, facial swelling, rhinorrhea, neck pain, neck stiffness and ear discharge.  Eyes: Negative for pain, discharge, redness, itching and visual disturbance. Respiratory: Negative for cough, choking, chest tightness, shortness of breath, wheezing and stridor.  Cardiovascular: Negative for chest pain, palpitations  and leg swelling. Gastrointestinal: Negative for abdominal distention. Genitourinary: Negative for dysuria, urgency, frequency, hematuria, flank pain, decreased urine volume, difficulty urinating and dyspareunia.  Musculoskeletal: Negative for back pain, joint swelling, arthralgia and gait problem. Neurological: Negative for dizziness, tremors, seizures, syncope, facial asymmetry, speech difficulty, weakness, light-headedness, and headaches. Hematological: Negative for adenopathy. Does not bruise/bleed easily. Psychiatric/Behavioral: Negative for hallucinations, behavioral problems, confusion, dysphoric mood, decreased concentration and agitation.    Objective:    Filed Vitals:   01/26/16 1716  BP: 127/87  Pulse: 102  Temp: 97.9 F (36.6 C)    Filed Weights   01/26/16 1716  Weight: 135 lb 12.8 oz (61.598 kg)    BP Readings from Last 3 Encounters:  01/26/16 127/87  12/16/15 123/86  12/15/15 128/87   bmi 18  Physical Exam: Constitutional: Patient appears well-developed and well-nourished. No distress. AAOx3, very tall/thin appearing AAM. HENT: Normocephalic, atraumatic, External right and left ear normal. Oropharynx is clear and moist.  Eyes: Conjunctivae and EOM are normal. PERRL, no scleral icterus. Neck: Normal ROM. Neck supple. No JVD.  No thyromegaly/carotid bruits. CVS: RRR, S1/S2 +, no murmurs, no gallops, no carotid bruit.  Pulmonary: Effort and breath sounds normal, no stridor, rhonchi, wheezes, rales.  Abdominal: Soft. BS +, no distension, tenderness, rebound or guarding.  Musculoskeletal: Normal range of motion. No edema and no tenderness.   Foot exam: bilateral peripheral pulses 2+ (dorsalis pedis and post tibialis pulses), no ulcers noted/no ecchymosis, warm to touch, monofilament testing 3/3 bilat. Sensation intact.  No c/c/e. Lymphadenopathy: No lymphadenopathy noted, cervical. Neuro: Alert, muscle tone coordination. No cranial nerve deficit grossly. Skin: Skin is warm and dry. No rash noted. Not diaphoretic. No erythema. No pallor. Psychiatric: Normal mood and affect. Behavior, judgment, thought content normal.  Lab Results  Component Value Date   WBC 5.1 12/15/2015   HGB 12.7* 12/15/2015   HCT 40.0 12/15/2015   MCV 76.6* 12/15/2015   PLT 217 12/15/2015   Lab Results  Component Value Date   CREATININE 1.02 12/15/2015   BUN 7 12/15/2015   NA 135 12/15/2015   K 3.9 12/15/2015   CL 103 12/15/2015   CO2 23 12/15/2015    Lab Results  Component Value Date   HGBA1C 12.7 01/26/2016   Lipid Panel     Component Value Date/Time   CHOL 188 04/09/2014 1106   TRIG 92 04/09/2014 1106   HDL 47 04/09/2014 1106    CHOLHDL 4.0 04/09/2014 1106   VLDL 18 04/09/2014 1106   LDLCALC 123* 04/09/2014 1106       Depression screen PHQ 2/9 01/26/2016 01/15/2016 07/20/2015  Decreased Interest 1 0 0  Down, Depressed, Hopeless 0 0 0  PHQ - 2 Score 1 0 0  Altered sleeping 0 - -  Tired, decreased energy 1 - -  Change in appetite 0 - -  Feeling bad or failure about yourself  0 - -  Trouble concentrating 0 - -  Moving slowly or fidgety/restless 0 - -  Suicidal thoughts 0 - -  PHQ-9 Score 2 - -  Difficult doing work/chores Not difficult at all - -    Assessment and plan:   1. DM (diabetes mellitus), secondary, uncontrolled, with hyperosmolarity (HCC) - out of control, pt is use to living w/ hyperglycemia, gets hypoglycemic s/sxs when glucose normalize or get less that 130-150s. - POCT glucose (manual entry) 239 - POCT glycosylated hemoglobin (Hb A1C) 12 - BASIC METABOLIC PANEL WITH GFR - Lipid Panel; Future - not  fasting today, need fasting. - dw/ pt and mom at length w/ Gradual increase in insulin, so that his body acclimates to the normalizing glucose. - insulin aspart protamine- aspart (NOVOLOG MIX 70/30) (70-30) 100 UNIT/ML injection; Inject 0.25 mLs (25 Units total) into the skin 2 (two) times daily with a meal. Pt to start at 17units bid x 1-2 wks, than increase by 1 unit bid weekly until reach goal of 25units bid; goal accuchks 80-130.  Dispense: 10 mL; Refill: 5 - d/w pt/mom at length about better DM control, the concerning complications, and need for annual eye exam and good foot care. - Pt states he watches his feet closely/ - now part of food bank for fresh food, but family has difficulty w/ transportation.   2. H/o Smoking  3. Vitamin D deficiency - currently on daily supplements, will chk - Vitamin D, 25-hydroxy  4. Protein-calorie malnutrition, moderate (HCC) bmi 18 - protein shakes coupons given for trial - TSH - r/o metabolic causes - 5. suspect muscle catabolism from out of control  DM.   Return in about 2 months (around 03/27/2016) for dm chk..  The patient was given clear instructions to go to ER or return to medical center if symptoms don't improve, worsen or new problems develop. The patient verbalized understanding. The patient was told to call to get lab results if they haven't heard anything in the next week.      Pete Glatterawn T Tamina Cyphers, MD, MBA/MHA [facility] [place], [place] [phone]   01/26/2016, 5:43 PM

## 2016-01-26 NOTE — Patient Instructions (Signed)
Dm education: goal glucose 80-130 Aim for 30 minutes of exercise most days. Rethink what you drink. Water is great! Aim for 2-3 Carb Choices per meal (30-45 grams) +/- 1 either way.  Aim for 0-15 Carbs per snack if hungry.  Include protein in moderation with your meals and snacks.  Consider reading food labels for Total Carbohydrate and Fat Grams of foods  Consider checking blood glucose (accuchecks/BG) at alternate times per day.  Continue taking medication as directed. Be mindful about how much sugar you are adding to beverages and other foods. Fruit Punch - find one with no sugar  Measure and decrease portions of carbohydrate foods. Make your plate and don't go back for seconds.  -  Diabetes Mellitus and Food It is important for you to manage your blood sugar (glucose) level. Your blood glucose level can be greatly affected by what you eat. Eating healthier foods in the appropriate amounts throughout the day at about the same time each day will help you control your blood glucose level. It can also help slow or prevent worsening of your diabetes mellitus. Healthy eating may even help you improve the level of your blood pressure and reach or maintain a healthy weight.  General recommendations for healthful eating and cooking habits include:  Eating meals and snacks regularly. Avoid going long periods of time without eating to lose weight.  Eating a diet that consists mainly of plant-based foods, such as fruits, vegetables, nuts, legumes, and whole grains.  Using low-heat cooking methods, such as baking, instead of high-heat cooking methods, such as deep frying. Work with your dietitian to make sure you understand how to use the Nutrition Facts information on food labels. HOW CAN FOOD AFFECT ME? Carbohydrates Carbohydrates affect your blood glucose level more than any other type of food. Your dietitian will help you determine how many carbohydrates to eat at each meal and teach you how  to count carbohydrates. Counting carbohydrates is important to keep your blood glucose at a healthy level, especially if you are using insulin or taking certain medicines for diabetes mellitus. Alcohol Alcohol can cause sudden decreases in blood glucose (hypoglycemia), especially if you use insulin or take certain medicines for diabetes mellitus. Hypoglycemia can be a life-threatening condition. Symptoms of hypoglycemia (sleepiness, dizziness, and disorientation) are similar to symptoms of having too much alcohol.  If your health care provider has given you approval to drink alcohol, do so in moderation and use the following guidelines:  Women should not have more than one drink per day, and men should not have more than two drinks per day. One drink is equal to:  12 oz of beer.  5 oz of wine.  1 oz of hard liquor.  Do not drink on an empty stomach.  Keep yourself hydrated. Have water, diet soda, or unsweetened iced tea.  Regular soda, juice, and other mixers might contain a lot of carbohydrates and should be counted. WHAT FOODS ARE NOT RECOMMENDED? As you make food choices, it is important to remember that all foods are not the same. Some foods have fewer nutrients per serving than other foods, even though they might have the same number of calories or carbohydrates. It is difficult to get your body what it needs when you eat foods with fewer nutrients. Examples of foods that you should avoid that are high in calories and carbohydrates but low in nutrients include:  Trans fats (most processed foods list trans fats on the Nutrition Facts label).  Regular  soda.  Juice.  Candy.  Sweets, such as cake, pie, doughnuts, and cookies.  Fried foods. WHAT FOODS CAN I EAT? Eat nutrient-rich foods, which will nourish your body and keep you healthy. The food you should eat also will depend on several factors, including:  The calories you need.  The medicines you take.  Your weight.  Your  blood glucose level.  Your blood pressure level.  Your cholesterol level. You should eat a variety of foods, including:  Protein.  Lean cuts of meat.  Proteins low in saturated fats, such as fish, egg whites, and beans. Avoid processed meats.  Fruits and vegetables.  Fruits and vegetables that may help control blood glucose levels, such as apples, mangoes, and yams.  Dairy products.  Choose fat-free or low-fat dairy products, such as milk, yogurt, and cheese.  Grains, bread, pasta, and rice.  Choose whole grain products, such as multigrain bread, whole oats, and brown rice. These foods may help control blood pressure.  Fats.  Foods containing healthful fats, such as nuts, avocado, olive oil, canola oil, and fish. DOES EVERYONE WITH DIABETES MELLITUS HAVE THE SAME MEAL PLAN? Because every person with diabetes mellitus is different, there is not one meal plan that works for everyone. It is very important that you meet with a dietitian who will help you create a meal plan that is just right for you.   This information is not intended to replace advice given to you by your health care provider. Make sure you discuss any questions you have with your health care provider.   Document Released: 06/02/2005 Document Revised: 09/26/2014 Document Reviewed: 08/02/2013 Elsevier Interactive Patient Education 2016 [employer]. - Diabetes and Exercise Exercising regularly is important. It is not just about losing weight. It has many health benefits, such as:  Improving your overall fitness, flexibility, and endurance.  Increasing your bone density.  Helping with weight control.  Decreasing your body fat.  Increasing your muscle strength.  Reducing stress and tension.  Improving your overall health. People with diabetes who exercise gain additional benefits because exercise:  Reduces appetite.  Improves the body's use of blood sugar (glucose).  Helps lower or control blood  glucose.  Decreases blood pressure.  Helps control blood lipids (such as cholesterol and triglycerides).  Improves the body's use of the hormone insulin by:  Increasing the body's insulin sensitivity.  Reducing the body's insulin needs.  Decreases the risk for heart disease because exercising:  Lowers cholesterol and triglycerides levels.  Increases the levels of good cholesterol (such as high-density lipoproteins [HDL]) in the body.  Lowers blood glucose levels. YOUR ACTIVITY PLAN  Choose an activity that you enjoy, and set realistic goals. To exercise safely, you should begin practicing any new physical activity slowly, and gradually increase the intensity of the exercise over time. Your health care provider or diabetes educator can help create an activity plan that works for you. General recommendations include:  Encouraging children to engage in at least 60 minutes of physical activity each day.  Stretching and performing strength training exercises, such as yoga or weight lifting, at least 2 times per week.  Performing a total of at least 150 minutes of moderate-intensity exercise each week, such as brisk walking or water aerobics.  Exercising at least 3 days per week, making sure you allow no more than 2 consecutive days to pass without exercising.  Avoiding long periods of inactivity (90 minutes or more). When you have to spend an extended period of  time sitting down, take frequent breaks to walk or stretch. RECOMMENDATIONS FOR EXERCISING WITH TYPE 1 OR TYPE 2 DIABETES   Check your blood glucose before exercising. If blood glucose levels are greater than 240 mg/dL, check for urine ketones. Do not exercise if ketones are present.  Avoid injecting insulin into areas of the body that are going to be exercised. For example, avoid injecting insulin into:  The arms when playing tennis.  The legs when jogging.  Keep a record of:  Food intake before and after you  exercise.  Expected peak times of insulin action.  Blood glucose levels before and after you exercise.  The type and amount of exercise you have done.  Review your records with your health care provider. Your health care provider will help you to develop guidelines for adjusting food intake and insulin amounts before and after exercising.  If you take insulin or oral hypoglycemic agents, watch for signs and symptoms of hypoglycemia. They include:  Dizziness.  Shaking.  Sweating.  Chills.  Confusion.  Drink plenty of water while you exercise to prevent dehydration or heat stroke. Body water is lost during exercise and must be replaced.  Talk to your health care provider before starting an exercise program to make sure it is safe for you. Remember, almost any type of activity is better than none.   This information is not intended to replace advice given to you by your health care provider. Make sure you discuss any questions you have with your health care provider.   Document Released: 11/26/2003 Document Revised: 01/20/2015 Document Reviewed: 02/12/2013 Elsevier Interactive Patient Education [employer]2016 [employer].

## 2016-01-27 ENCOUNTER — Other Ambulatory Visit: Payer: Self-pay | Admitting: Internal Medicine

## 2016-01-27 LAB — BASIC METABOLIC PANEL WITH GFR
BUN: 18 mg/dL (ref 7–25)
CALCIUM: 9.3 mg/dL (ref 8.6–10.3)
CO2: 26 mmol/L (ref 20–31)
CREATININE: 1.01 mg/dL (ref 0.60–1.35)
Chloride: 101 mmol/L (ref 98–110)
GFR, Est African American: >89 mL/min (ref >=60)
GFR, Est Non African American: >89 mL/min (ref >=60)
GLUCOSE: 219 mg/dL — AB (ref 65–99)
Potassium: 3.9 mmol/L (ref 3.5–5.3)
Sodium: 137 mmol/L (ref 135–146)

## 2016-01-27 LAB — TSH: TSH: 1.25 mIU/L (ref 0.40–4.50)

## 2016-01-27 LAB — VITAMIN D 25 HYDROXY (VIT D DEFICIENCY, FRACTURES): Vit D, 25-Hydroxy: 15 ng/mL — ABNORMAL LOW (ref 30–100)

## 2016-01-27 MED ORDER — VITAMIN D (ERGOCALCIFEROL) 1.25 MG (50000 UNIT) PO CAPS: 50000.0000 [IU] | ORAL_CAPSULE | ORAL | Status: DC

## 2016-01-27 MED FILL — VIT D2 1.25 MG (50,000 UNIT: 1.25 MG | 84 days supply | Qty: 12 | Fill #0

## 2016-02-03 MED FILL — !NOVOLOG MIX 70/30 VIAL: 70-30/ML | 30 days supply | Qty: 10 | Fill #0

## 2018-03-21 ENCOUNTER — Other Ambulatory Visit: Payer: Self-pay

## 2018-03-21 ENCOUNTER — Emergency Department (HOSPITAL_COMMUNITY): Payer: Self-pay

## 2018-03-21 ENCOUNTER — Encounter (HOSPITAL_COMMUNITY): Payer: Self-pay | Admitting: *Deleted

## 2018-03-21 ENCOUNTER — Inpatient Hospital Stay (HOSPITAL_COMMUNITY)
Admission: EM | Admit: 2018-03-21 | Discharge: 2018-03-25 | DRG: 638 | Disposition: A | Discharge status: Home / Self Care | Payer: Self-pay | Attending: Internal Medicine | Admitting: Internal Medicine

## 2018-03-21 DIAGNOSIS — Z8249 Family history of ischemic heart disease and other diseases of the circulatory system: Secondary | ICD-10-CM

## 2018-03-21 DIAGNOSIS — D72829 Elevated white blood cell count, unspecified: Secondary | ICD-10-CM

## 2018-03-21 DIAGNOSIS — Z833 Family history of diabetes mellitus: Secondary | ICD-10-CM

## 2018-03-21 DIAGNOSIS — E872 Acidosis, unspecified: Secondary | ICD-10-CM

## 2018-03-21 DIAGNOSIS — N179 Acute kidney failure, unspecified: Secondary | ICD-10-CM

## 2018-03-21 DIAGNOSIS — E1300 Other specified diabetes mellitus with hyperosmolarity without nonketotic hyperglycemic-hyperosmolar coma (NKHHC): Secondary | ICD-10-CM

## 2018-03-21 DIAGNOSIS — Z794 Long term (current) use of insulin: Secondary | ICD-10-CM

## 2018-03-21 DIAGNOSIS — R197 Diarrhea, unspecified: Secondary | ICD-10-CM

## 2018-03-21 DIAGNOSIS — E860 Dehydration: Secondary | ICD-10-CM

## 2018-03-21 DIAGNOSIS — E11649 Type 2 diabetes mellitus with hypoglycemia without coma: Secondary | ICD-10-CM | POA: Diagnosis not present

## 2018-03-21 DIAGNOSIS — D509 Iron deficiency anemia, unspecified: Secondary | ICD-10-CM | POA: Diagnosis present

## 2018-03-21 DIAGNOSIS — R112 Nausea with vomiting, unspecified: Secondary | ICD-10-CM

## 2018-03-21 DIAGNOSIS — E1110 Type 2 diabetes mellitus with ketoacidosis without coma: Secondary | ICD-10-CM | POA: Diagnosis present

## 2018-03-21 DIAGNOSIS — R1310 Dysphagia, unspecified: Secondary | ICD-10-CM | POA: Diagnosis present

## 2018-03-21 DIAGNOSIS — Z87891 Personal history of nicotine dependence: Secondary | ICD-10-CM

## 2018-03-21 DIAGNOSIS — Z9049 Acquired absence of other specified parts of digestive tract: Secondary | ICD-10-CM

## 2018-03-21 DIAGNOSIS — Z9089 Acquired absence of other organs: Secondary | ICD-10-CM

## 2018-03-21 DIAGNOSIS — D649 Anemia, unspecified: Secondary | ICD-10-CM

## 2018-03-21 DIAGNOSIS — E871 Hypo-osmolality and hyponatremia: Secondary | ICD-10-CM | POA: Diagnosis present

## 2018-03-21 DIAGNOSIS — Z79899 Other long term (current) drug therapy: Secondary | ICD-10-CM

## 2018-03-21 HISTORY — DX: Acute kidney failure, unspecified: N17.9

## 2018-03-21 LAB — CBG MONITORING, ED
GLUCOSE-CAPILLARY: 416 mg/dL — AB (ref 70–99)
Glucose-Capillary: 463 mg/dL — ABNORMAL HIGH (ref 70–99)
Glucose-Capillary: 452 mg/dL — ABNORMAL HIGH (ref 70–99)

## 2018-03-21 LAB — CBC
HEMATOCRIT: 41.7 % (ref 39.0–52.0)
Hemoglobin: 12.9 g/dL — ABNORMAL LOW (ref 13.0–17.0)
MCH: 24.2 pg — ABNORMAL LOW (ref 26.0–34.0)
MCHC: 30.9 g/dL (ref 30.0–36.0)
MCV: 78.4 fL (ref 78.0–100.0)
Platelets: 256 10*3/uL (ref 150–400)
RBC: 5.32 MIL/uL (ref 4.22–5.81)
RDW: 14 % (ref 11.5–15.5)
WBC: 25.9 10*3/uL — AB (ref 4.0–10.5)

## 2018-03-21 LAB — HEPATIC FUNCTION PANEL
ALBUMIN: 4.2 g/dL (ref 3.5–5.0)
ALK PHOS: 101 U/L (ref 38–126)
ALT: 33 U/L (ref 0–44)
AST: 27 U/L (ref 15–41)
BILIRUBIN TOTAL: 1.4 mg/dL — AB (ref 0.3–1.2)
Bilirubin, Direct: 0.2 mg/dL (ref 0.0–0.2)
Indirect Bilirubin: 1.2 mg/dL — ABNORMAL HIGH (ref 0.3–0.9)
Total Protein: 7.8 g/dL (ref 6.5–8.1)

## 2018-03-21 LAB — I-STAT VENOUS BLOOD GAS, ED
ACID-BASE DEFICIT: 12 mmol/L — AB (ref 0.0–2.0)
Bicarbonate: 13.8 mmol/L — ABNORMAL LOW (ref 20.0–28.0)
O2 SAT: 72 %
PO2 VEN: 44 mmHg (ref 32.0–45.0)
TCO2: 15 mmol/L — ABNORMAL LOW (ref 22–32)
pCO2, Ven: 32.8 mmHg — ABNORMAL LOW (ref 44.0–60.0)
pH, Ven: 7.233 — ABNORMAL LOW (ref 7.250–7.430)

## 2018-03-21 LAB — I-STAT CG4 LACTIC ACID, ED
LACTIC ACID, VENOUS: 2.12 mmol/L — AB (ref 0.5–1.9)
Lactic Acid, Venous: 3.46 mmol/L (ref 0.5–1.9)

## 2018-03-21 LAB — URINALYSIS, ROUTINE W REFLEX MICROSCOPIC
Bacteria, UA: NONE SEEN
Bilirubin Urine: NEGATIVE
Glucose, UA: >=500 mg/dL — AB
Ketones, ur: 80 mg/dL — AB
Leukocytes, UA: NEGATIVE
Nitrite: NEGATIVE
Protein, ur: NEGATIVE mg/dL
Specific Gravity, Urine: 1.025 (ref 1.005–1.030)
pH: 5 (ref 5.0–8.0)

## 2018-03-21 LAB — I-STAT TROPONIN, ED: Troponin i, poc: 0.02 ng/mL (ref 0.00–0.08)

## 2018-03-21 LAB — BASIC METABOLIC PANEL
Anion gap: 23 — ABNORMAL HIGH (ref 5–15)
BUN: 34 mg/dL — AB (ref 6–20)
CHLORIDE: 99 mmol/L (ref 98–111)
CO2: 12 mmol/L — AB (ref 22–32)
Calcium: 9.5 mg/dL (ref 8.9–10.3)
Creatinine, Ser: 1.8 mg/dL — ABNORMAL HIGH (ref 0.61–1.24)
GFR calc Af Amer: 52 mL/min — ABNORMAL LOW (ref >60)
GFR calc non Af Amer: 45 mL/min — ABNORMAL LOW (ref >60)
GLUCOSE: 459 mg/dL — AB (ref 70–99)
POTASSIUM: 5 mmol/L (ref 3.5–5.1)
Sodium: 134 mmol/L — ABNORMAL LOW (ref 135–145)

## 2018-03-21 LAB — LIPASE, BLOOD: LIPASE: 17 U/L (ref 11–51)

## 2018-03-21 MED ORDER — DEXTROSE-NACL 5-0.45 % IV SOLN
INTRAVENOUS | Status: DC
  Administered 2018-03-22 (×2): via INTRAVENOUS

## 2018-03-21 MED ORDER — SODIUM CHLORIDE 0.9 % IV BOLUS
1000.0000 mL | Freq: Once | INTRAVENOUS | Status: AC
  Administered 2018-03-21: 999 mL via INTRAVENOUS

## 2018-03-21 MED ORDER — HEPARIN SODIUM (PORCINE) 5000 UNIT/ML IJ SOLN
5000.0000 [IU] | Freq: Three times a day (TID) | INTRAMUSCULAR | Status: DC
  Administered 2018-03-22 – 2018-03-25 (×6): 5000 [IU] via SUBCUTANEOUS
  Filled 2018-03-21 (×7): qty 1

## 2018-03-21 MED ORDER — SODIUM CHLORIDE 0.9 % IV SOLN: INTRAVENOUS | Status: DC

## 2018-03-21 MED ORDER — ONDANSETRON HCL 4 MG/2ML IJ SOLN
4.0000 mg | Freq: Once | INTRAMUSCULAR | Status: AC
  Administered 2018-03-21: 4 mg via INTRAVENOUS
  Filled 2018-03-21: qty 2

## 2018-03-21 MED ORDER — SODIUM CHLORIDE 0.9 % IV SOLN
INTRAVENOUS | Status: DC
  Administered 2018-03-21: 23:00:00 via INTRAVENOUS

## 2018-03-21 MED ORDER — ONDANSETRON HCL 4 MG/2ML IJ SOLN
4.0000 mg | Freq: Four times a day (QID) | INTRAMUSCULAR | Status: DC | PRN
  Administered 2018-03-22 – 2018-03-24 (×2): 4 mg via INTRAVENOUS
  Filled 2018-03-21 (×3): qty 2

## 2018-03-21 MED ORDER — SODIUM CHLORIDE 0.9 % IV SOLN
INTRAVENOUS | Status: DC
  Filled 2018-03-21: qty 1

## 2018-03-21 MED ORDER — SODIUM CHLORIDE 0.9 % IV SOLN
INTRAVENOUS | Status: DC
  Administered 2018-03-21: 3.9 [IU]/h via INTRAVENOUS
  Administered 2018-03-22: 1.6 [IU]/h via INTRAVENOUS
  Administered 2018-03-22: 3.7 [IU]/h via INTRAVENOUS
  Administered 2018-03-22: 5.3 [IU]/h via INTRAVENOUS
  Filled 2018-03-21: qty 1

## 2018-03-21 MED ORDER — SODIUM CHLORIDE 0.9 % IV BOLUS: 1000.0000 mL | Freq: Once | INTRAVENOUS | Status: DC

## 2018-03-21 MED ORDER — DEXTROSE-NACL 5-0.45 % IV SOLN: INTRAVENOUS | Status: DC

## 2018-03-21 NOTE — ED Provider Notes (Signed)
[facility] Provider Note   CSN: [id number] Arrival date & time: 03/21/18  2030     History   Chief Complaint Chief Complaint  Patient presents with  . Weakness  . Emesis    HPI [name] is a 42 y.o. male.  The history is provided by the patient and medical records.  Emesis   This is a new problem. The current episode started more than 2 days ago. The problem occurs continuously. The problem has not changed since onset.The emesis has an appearance of stomach contents. There has been no fever. Associated symptoms include abdominal pain and diarrhea. Pertinent negatives include no arthralgias, no chills, no cough, no fever, no headaches, no myalgias and no URI.    Past Medical History:  Diagnosis Date  . Diabetes mellitus    Newly diagnosed, May 2012    Patient Active Problem List   Diagnosis Date Noted  . DM (diabetes mellitus), secondary, uncontrolled, with hyperosmolarity (HCC) 01/26/2016  . Diabetes mellitus due to underlying condition without complications (HCC) 04/08/2014  . Smoking 04/08/2014    Past Surgical History:  Procedure Laterality Date  . APPENDECTOMY    . TONSILLECTOMY          Home Medications    Prior to Admission medications   Medication Sig Start Date End Date Taking? Authorizing Provider  gabapentin (NEURONTIN) 300 MG capsule Take 1 capsule (300 mg total) by mouth at bedtime. 07/20/15   Ambrose FinlandKeck, Valerie A, NP  insulin aspart protamine- aspart (NOVOLOG MIX 70/30) (70-30) 100 UNIT/ML injection Inject 0.25 mLs (25 Units total) into the skin 2 (two) times daily with a meal. Pt to start at 17units bid x 1-2 wks, than increase by 1 unit bid weekly until reach goal of 25units bid; goal accuchks 80-130. 01/26/16   Langeland, Dawn T, MD  Vitamin D, Ergocalciferol, (DRISDOL) 50000 units CAPS capsule Take 1 capsule (50,000 Units total) by mouth every 7 (seven) days. 01/27/16   Pete GlatterLangeland, Dawn T, MD    Family  History Family History  Problem Relation Age of Onset  . Hypertension Mother   . Diabetes Sister        Several brothers and sister with DM  . Diabetes Brother     Social History Social History   Tobacco Use  . Smoking status: Former Smoker    Last attempt to quit: 10/20/2015    Years since quitting: 2.4  . Smokeless tobacco: Former NeurosurgeonUser    Quit date: 10/20/2015  . Tobacco comment: Smoking 1 cigs every 4 days  Substance Use Topics  . Alcohol use: No    Alcohol/week: 0.0 oz  . Drug use: Yes    Types: Marijuana    Comment: last use 2-3 days ago     Allergies   Patient has no known allergies.   Review of Systems Review of Systems  Constitutional: Negative for chills, diaphoresis, fatigue and fever.  HENT: Negative for congestion.   Respiratory: Negative for cough, chest tightness, shortness of breath, wheezing and stridor.   Cardiovascular: Negative for chest pain and palpitations.  Gastrointestinal: Positive for abdominal pain, diarrhea, nausea and vomiting. Negative for constipation.  Endocrine: Positive for polyuria.  Genitourinary: Positive for frequency. Negative for difficulty urinating and dysuria.  Musculoskeletal: Negative for arthralgias, back pain and myalgias.  Skin: Negative for rash and wound.  Neurological: Negative for seizures, light-headedness and headaches.  Psychiatric/Behavioral: Negative for agitation.  All other systems reviewed and are negative.  Physical Exam Updated Vital Signs BP (!) 145/84   Pulse (!) 110   Temp 98.1 F (36.7 C)   Resp (!) 26   Ht 6' (1.829 m)   Wt 63.5 kg (140 lb)   SpO2 100%   BMI 18.99 kg/m   Physical Exam  Constitutional: He is oriented to person, place, and time. He appears well-developed and well-nourished. No distress.  HENT:  Head: Normocephalic and atraumatic.  Right Ear: External ear normal.  Left Ear: External ear normal.  Nose: Nose normal.  Mouth/Throat: Oropharynx is clear and moist. No  oropharyngeal exudate.  Eyes: Pupils are equal, round, and reactive to light. Conjunctivae and EOM are normal.  Neck: Normal range of motion. Neck supple.  Cardiovascular: Intact distal pulses. Tachycardia present.  No murmur heard. Pulmonary/Chest: Effort normal and breath sounds normal. No stridor. No respiratory distress. He has no wheezes. He exhibits no tenderness.  Abdominal: Soft. There is no tenderness. There is no rebound and no guarding.  Musculoskeletal: He exhibits no edema or tenderness.  Neurological: He is alert and oriented to person, place, and time. He displays normal reflexes. No cranial nerve deficit. He exhibits normal muscle tone. Coordination normal.  Skin: Skin is warm. Capillary refill takes less than 2 seconds. No rash noted. He is not diaphoretic. No erythema. No pallor.  Psychiatric: He has a normal mood and affect.  Nursing note and vitals reviewed.    ED Treatments / Results  Labs (all labs ordered are listed, but only abnormal results are displayed) Labs Reviewed  BASIC METABOLIC PANEL - Abnormal; Notable for the following components:      Result Value   Sodium 134 (*)    CO2 12 (*)    Glucose, Bld 459 (*)    BUN 34 (*)    Creatinine, Ser 1.80 (*)    GFR calc non Af Amer 45 (*)    GFR calc Af Amer 52 (*)    Anion gap 23 (*)    All other components within normal limits  CBC - Abnormal; Notable for the following components:   WBC 25.9 (*)    Hemoglobin 12.9 (*)    MCH 24.2 (*)    All other components within normal limits  URINALYSIS, ROUTINE W REFLEX MICROSCOPIC - Abnormal; Notable for the following components:   Color, Urine STRAW (*)    Glucose, UA >=500 (*)    Hgb urine dipstick SMALL (*)    Ketones, ur 80 (*)    All other components within normal limits  HEPATIC FUNCTION PANEL - Abnormal; Notable for the following components:   Total Bilirubin 1.4 (*)    Indirect Bilirubin 1.2 (*)    All other components within normal limits  CBG  MONITORING, ED - Abnormal; Notable for the following components:   Glucose-Capillary 463 (*)    All other components within normal limits  I-STAT VENOUS BLOOD GAS, ED - Abnormal; Notable for the following components:   pH, Ven 7.233 (*)    pCO2, Ven 32.8 (*)    Bicarbonate 13.8 (*)    TCO2 15 (*)    Acid-base deficit 12.0 (*)    All other components within normal limits  I-STAT CG4 LACTIC ACID, ED - Abnormal; Notable for the following components:   Lactic Acid, Venous 3.46 (*)    All other components within normal limits  I-STAT CG4 LACTIC ACID, ED - Abnormal; Notable for the following components:   Lactic Acid, Venous 2.12 (*)    All  other components within normal limits  CBG MONITORING, ED - Abnormal; Notable for the following components:   Glucose-Capillary 416 (*)    All other components within normal limits  CBG MONITORING, ED - Abnormal; Notable for the following components:   Glucose-Capillary 452 (*)    All other components within normal limits  CBG MONITORING, ED - Abnormal; Notable for the following components:   Glucose-Capillary 388 (*)    All other components within normal limits  URINE CULTURE  LIPASE, BLOOD  HIV ANTIBODY (ROUTINE TESTING)  BASIC METABOLIC PANEL  BASIC METABOLIC PANEL  BASIC METABOLIC PANEL  LACTIC ACID, PLASMA  LACTIC ACID, PLASMA  CBC WITH DIFFERENTIAL/PLATELET  I-STAT TROPONIN, ED    EKG EKG Interpretation  Date/Time:  Wednesday March 21 2018 20:36:02 EDT Ventricular Rate:  126 PR Interval:    QRS Duration: 97 QT Interval:  328 QTC Calculation: 475 R Axis:   120 Text Interpretation:  Sinus tachycardia Anterior infarct, old Lateral leads are also involved Baseline wander in lead(s) II III aVF When compared to prior ECG, similar ST elevations in leads 2,3,AVF. Faster rate.  No STEMI Confirmed by Theda Belfast ([id number]) on 03/21/2018 8:48:38 PM   Radiology Dg Chest 2 View  Result Date: 03/21/2018 CLINICAL DATA:  Tachycardia EXAM: CHEST -  2 VIEW COMPARISON:  12/16/2015 FINDINGS: Hyperinflation. No acute airspace disease or pleural effusion. Normal heart size. No pneumothorax. IMPRESSION: No active cardiopulmonary disease.  Hyperinflation Electronically Signed   By: Jasmine Pang M.D.   On: 03/21/2018 22:33    Procedures Procedures (including critical care time)  CRITICAL CARE Performed by: Canary Brim Gor Vestal Total critical care time: 45 minutes Critical care time was exclusive of separately billable procedures and treating other patients. DKA Critical care was necessary to treat or prevent imminent or life-threatening deterioration. Critical care was time spent personally by me on the following activities: development of treatment plan with patient and/or surrogate as well as nursing, discussions with consultants, evaluation of patient's response to treatment, examination of patient, obtaining history from patient or surrogate, ordering and performing treatments and interventions, ordering and review of laboratory studies, ordering and review of radiographic studies, pulse oximetry and re-evaluation of patient's condition.  Medications Ordered in ED Medications  sodium chloride 0.9 % bolus 1,000 mL (has no administration in time range)  0.9 %  sodium chloride infusion ( Intravenous New Bag/Given 03/21/18 2326)  dextrose 5 %-0.45 % sodium chloride infusion (has no administration in time range)  insulin regular (NOVOLIN R,HUMULIN R) 100 Units in sodium chloride 0.9 % 100 mL (1 Units/mL) infusion (6.6 Units/hr Intravenous Rate/Dose Change 03/22/18 0037)  heparin injection 5,000 Units (has no administration in time range)  ondansetron (ZOFRAN) injection 4 mg (has no administration in time range)  sodium chloride 0.9 % bolus 1,000 mL (0 mLs Intravenous Stopped 03/21/18 2317)  ondansetron (ZOFRAN) injection 4 mg (4 mg Intravenous Given 03/21/18 2119)     Initial Impression / Assessment and Plan / ED Course  I have reviewed the triage  vital signs and the nursing notes.  Pertinent labs & imaging results that were available during my care of the patient were reviewed by me and considered in my medical decision making (see chart for details).     [name] is a 42 y.o. male with a past medical history significant for diabetes who presents with nausea, vomiting, diarrhea, polyuria, and abdominal cramping.  Patient is accompanied by family who reports that for the last 4 days, patient  has had GI symptoms of nausea, vomiting, and diarrhea.  Patient reports that he has not been able to take his medicine for the last 2 days due to feeling so bad.  He thinks he may be dehydrated and feels his mouth is dry.  He reports that he has never had DKA.  He denies recent fevers or chills.  He denies any constipation.  He denies dysuria or any blood in his emesis or diarrhea.  He denies trauma.  He reports his abdominal cramping is mild and is worsened with vomiting.  He reports some esophageal burning from the emesis.  He denies current chest pain or shortness of breath.  He denies cough, or shortness of breath.  On arrival, patient was found to have tachycardia and tachypnea.  Patient's glucose is in the 400s.  On exam, abdomen is nontender.  Chest is nontender.  Lungs are clear.  Patient is alert and oriented.  No focal neurologic deficit seen.  Back is nontender.  Clinically I am concerned about DKA.  Laboratory testing confirmed DKA.  Patient did have lactic acidosis and leukocytosis however urinalysis did not show infection.  Suspect gastroenteritis as cause of patient's nausea and vomiting and diarrhea.  Chest x-ray shows no pneumonia.  Doubt bacterial infection.  Patient found to have AKI and fluids were given.  After DKA was discovered, patient started on insulin drip and will be admitted to hospitalist service for further management.   Patient admitted for further management.   Final Clinical Impressions(s) / ED Diagnoses    Final diagnoses:  Diabetic ketoacidosis without coma associated with type 2 diabetes mellitus (HCC)  Dehydration  Nausea vomiting and diarrhea    ED Discharge Orders    None     Clinical Impression: 1. Diabetic ketoacidosis without coma associated with type 2 diabetes mellitus (HCC)   2. Dehydration   3. Nausea vomiting and diarrhea     Disposition: Admit  This note was prepared with assistance of Dragon voice recognition software. Occasional wrong-word or sound-a-like substitutions may have occurred due to the inherent limitations of voice recognition software.     Ensley Blas, Canary Brim, MD 03/22/18 [phone]

## 2018-03-21 NOTE — H&P (Signed)
History and Physical    [name]:[id number] DOB: 1976-02-09 DOA: 03/21/2018  PCP: Patient, No Pcp Per   Patient coming from: home  Chief Complaint: Abdominal discomfort, malaise, N/V   HPI: [name] is a 42 y.o. male with medical history significant for uncontrolled diabetes mellitus, now presenting to the emergency department with 2 to 3 days of abdominal discomfort, malaise, and nausea with nonbloody vomiting.  Patient reports he been in his usual state of health until 2 to 3 days ago when he noted the insidious development of a general malaise and abdominal discomfort with nausea.  He is gone on to experience worsening in these symptoms and has had recurrent nonbloody vomiting.  He denies fevers, chills, chest pain, headache, change in vision or hearing, or focal numbness or weakness.  He reports that he is generally adherent with his insulin regimen, but not in the last couple days because he was feeling sick.  He is unable to state how much insulin he takes or how often, reporting that he has it written down at home.    ED Course: Upon arrival to the ED, patient is found to be afebrile, saturating well on room air, slightly tachypneic, slightly tachycardic, and with stable blood pressure.  EKG features a sinus tachycardia with T wave abnormality and chest x-ray is negative for acute cardia pulmonary disease.  Urinalysis is notable for glucosuria and ketonuria.  Chemistry panel features a slight hyponatremia, bicarbonate of 12, anion gap 23, glucose 459, and creatinine of 1.80, up from 1.02 years ago.  CBC features a leukocytosis to 25,900 and a hemoglobin of 12.9.  Initial lactic acid is elevated to 3.46 and troponin is normal.  Patient was treated with 2 L normal saline and started on insulin infusion in the ED.  Tachycardia has improved with the IV fluids, blood pressure remained stable, and he will be admitted to the stepdown unit for ongoing evaluation and management of  DKA.  Review of Systems:  All other systems reviewed and apart from HPI, are negative.  Past Medical History:  Diagnosis Date  . Diabetes mellitus    Newly diagnosed, May 2012    Past Surgical History:  Procedure Laterality Date  . APPENDECTOMY    . TONSILLECTOMY       reports that he quit smoking about 2 years ago. He quit smokeless tobacco use about 2 years ago. He reports that he has current or past drug history. Drug: Marijuana. He reports that he does not drink alcohol.  No Known Allergies  Family History  Problem Relation Age of Onset  . Hypertension Mother   . Diabetes Sister        Several brothers and sister with DM  . Diabetes Brother      Prior to Admission medications   Medication Sig Start Date End Date Taking? Authorizing Provider  insulin aspart protamine- aspart (NOVOLOG MIX 70/30) (70-30) 100 UNIT/ML injection Inject 0.25 mLs (25 Units total) into the skin 2 (two) times daily with a meal. Pt to start at 17units bid x 1-2 wks, than increase by 1 unit bid weekly until reach goal of 25units bid; goal accuchks 80-130. Patient taking differently: Inject 0-25 Units into the skin 2 (two) times daily with a meal.  01/26/16  Yes Pete Glatter, MD    Physical Exam: Vitals:   03/21/18 2033 03/21/18 2038 03/21/18 2039  BP: (!) 145/84 (!) 145/84   Pulse:  (!) 110   Resp:  Marland Kitchen)  26   Temp:  98.1 F (36.7 C)   SpO2:  100%   Weight:   63.5 kg (140 lb)  Height:   6' (1.829 m)      Constitutional: NAD, frail, appearing older than stated age Eyes: PERTLA, lids and conjunctivae normal ENMT: Mucous membranes are dry. Posterior pharynx clear of any exudate or lesions.   Neck: normal, supple, no masses, no thyromegaly Respiratory: clear to auscultation bilaterally, no wheezing, no crackles. Mild tachypnea. Normal respiratory effort.    Cardiovascular: Rate ~120 and regular. No extremity edema.  Abdomen: No distension, soft, mild generalized tenderness without  rebound pain or guarding. Bowel sounds active.  Musculoskeletal: no clubbing / cyanosis. No joint deformity upper and lower extremities.    Skin: no significant rashes, lesions, ulcers. Poor turgor. Neurologic: No facial asymmetry. Sensation intact. Moving all extremities.  Psychiatric: Alert and oriented x 3. Withdrawn, requires coaxing to answer basic questions.      Labs on Admission: I have personally reviewed following labs and imaging studies  CBC: Recent Labs  Lab 03/21/18 2035  WBC 25.9*  HGB 12.9*  HCT 41.7  MCV 78.4  PLT 256   Basic Metabolic Panel: Recent Labs  Lab 03/21/18 2035  NA 134*  K 5.0  CL 99  CO2 12*  GLUCOSE 459*  BUN 34*  CREATININE 1.80*  CALCIUM 9.5   GFR: Estimated Creatinine Clearance: 48.5 mL/min (A) (by C-G formula based on SCr of 1.8 mg/dL (H)). Liver Function Tests: Recent Labs  Lab 03/21/18 2035  AST 27  ALT 33  ALKPHOS 101  BILITOT 1.4*  PROT 7.8  ALBUMIN 4.2   Recent Labs  Lab 03/21/18 2035  LIPASE 17   No results for input(s): AMMONIA in the last 168 hours. Coagulation Profile: No results for input(s): INR, PROTIME in the last 168 hours. Cardiac Enzymes: No results for input(s): CKTOTAL, CKMB, CKMBINDEX, TROPONINI in the last 168 hours. BNP (last 3 results) No results for input(s): PROBNP in the last 8760 hours. HbA1C: No results for input(s): HGBA1C in the last 72 hours. CBG: Recent Labs  Lab 03/21/18 2042 03/21/18 2233 03/21/18 2301  GLUCAP 463* 416* 452*   Lipid Profile: No results for input(s): CHOL, HDL, LDLCALC, TRIG, CHOLHDL, LDLDIRECT in the last 72 hours. Thyroid Function Tests: No results for input(s): TSH, T4TOTAL, FREET4, T3FREE, THYROIDAB in the last 72 hours. Anemia Panel: No results for input(s): VITAMINB12, FOLATE, FERRITIN, TIBC, IRON, RETICCTPCT in the last 72 hours. Urine analysis:    Component Value Date/Time   COLORURINE STRAW (A) 03/21/2018 2049   APPEARANCEUR CLEAR 03/21/2018 2049    LABSPEC 1.025 03/21/2018 2049   PHURINE 5.0 03/21/2018 2049   GLUCOSEU >=500 (A) 03/21/2018 2049   HGBUR SMALL (A) 03/21/2018 2049   BILIRUBINUR NEGATIVE 03/21/2018 2049   BILIRUBINUR neg 04/08/2014 1514   KETONESUR 80 (A) 03/21/2018 2049   PROTEINUR NEGATIVE 03/21/2018 2049   UROBILINOGEN 0.2 04/08/2014 1514   UROBILINOGEN 0.2 03/29/2014 1206   NITRITE NEGATIVE 03/21/2018 2049   LEUKOCYTESUR NEGATIVE 03/21/2018 2049   Sepsis Labs: @LABRCNTIP (procalcitonin:4,lacticidven:4) )No results found for this or any previous visit (from the past 240 hour(s)).   Radiological Exams on Admission: Dg Chest 2 View  Result Date: 03/21/2018 CLINICAL DATA:  Tachycardia EXAM: CHEST - 2 VIEW COMPARISON:  12/16/2015 FINDINGS: Hyperinflation. No acute airspace disease or pleural effusion. Normal heart size. No pneumothorax. IMPRESSION: No active cardiopulmonary disease.  Hyperinflation Electronically Signed   By: Adrian Prows.D.  On: 03/21/2018 22:33    EKG: Independently reviewed. Sinus tachycardia (rate 126), T-wave abnormalities.   Assessment/Plan   1. DKA; insulin-dependent DM  - Presents with abdominal discomfort, malaise, and N/V  - Found to be in DKA, was fluid-resuscitated with 2 liters NS and started on insulin infusion  - Most likely related to poor adherence to his insulin regimen given A1c values in 11-15 range and his inability to say how much insulin he uses or how often; there is no evidence for infection, CVA, or MI  - Continue insulin infusion with frequent CBG's, continue IVF hydration, follow serial chem panels    2. Acute kidney injury  - SCr is 1.80 on admission, up from 1.0 previously  - Likely an acute prerenal azotemia in setting of DKA   - He was fluid-resuscitated in ED with 2 liters NS and will be continued on IVF hydration  - Following serial chem panels as above    3. Leukocytosis  - No fever, and no evidence for infection on UA or CXR  - Suspect this is reactive  to #1; there is also hemoconcentration  - Culture if febrile, repeat CBC in am     DVT prophylaxis: sq heparin  Code Status: Full  Family Communication: Discussed with patient  Consults called: None Admission status: Inpatient     Briscoe Deutscherimothy S Opyd, MD [facility] Pager [phone]  If 7PM-7AM, please contact night-coverage [email or web address] Password [email or web address]  03/21/2018, 11:17 PM

## 2018-03-21 NOTE — ED Triage Notes (Signed)
The  Pt arrived by [facility] from home the pt has been feeling weak with nausea and vomiting for 2-3 days  He has taken no insulin today cbg with ems 489

## 2018-03-21 NOTE — ED Notes (Signed)
Iv per ems  They gave him zofran iv on the way herer

## 2018-03-22 ENCOUNTER — Encounter (HOSPITAL_COMMUNITY): Payer: Self-pay

## 2018-03-22 ENCOUNTER — Other Ambulatory Visit: Payer: Self-pay

## 2018-03-22 DIAGNOSIS — E872 Acidosis, unspecified: Secondary | ICD-10-CM

## 2018-03-22 DIAGNOSIS — D649 Anemia, unspecified: Secondary | ICD-10-CM

## 2018-03-22 DIAGNOSIS — D72829 Elevated white blood cell count, unspecified: Secondary | ICD-10-CM

## 2018-03-22 LAB — BASIC METABOLIC PANEL
Anion gap: 14 (ref 5–15)
Anion gap: 11 (ref 5–15)
Anion gap: 12 (ref 5–15)
Anion gap: 9 (ref 5–15)
BUN: 33 mg/dL — AB (ref 6–20)
BUN: 32 mg/dL — AB (ref 6–20)
BUN: 30 mg/dL — AB (ref 6–20)
BUN: 30 mg/dL — ABNORMAL HIGH (ref 6–20)
CHLORIDE: 108 mmol/L (ref 98–111)
CHLORIDE: 110 mmol/L (ref 98–111)
CHLORIDE: 108 mmol/L (ref 98–111)
CO2: 15 mmol/L — AB (ref 22–32)
CO2: 19 mmol/L — AB (ref 22–32)
CO2: 19 mmol/L — ABNORMAL LOW (ref 22–32)
CO2: 20 mmol/L — ABNORMAL LOW (ref 22–32)
CREATININE: 1.54 mg/dL — AB (ref 0.61–1.24)
CREATININE: 1.39 mg/dL — AB (ref 0.61–1.24)
CREATININE: 1.44 mg/dL — AB (ref 0.61–1.24)
Calcium: 9.2 mg/dL (ref 8.9–10.3)
Calcium: 9.4 mg/dL (ref 8.9–10.3)
Calcium: 9.3 mg/dL (ref 8.9–10.3)
Calcium: 9 mg/dL (ref 8.9–10.3)
Chloride: 108 mmol/L (ref 98–111)
Creatinine, Ser: 1.42 mg/dL — ABNORMAL HIGH (ref 0.61–1.24)
GFR calc Af Amer: >60 mL/min (ref >60)
GFR calc Af Amer: >60 mL/min (ref >60)
GFR calc Af Amer: >60 mL/min (ref >60)
GFR calc non Af Amer: 54 mL/min — ABNORMAL LOW (ref >60)
GFR calc non Af Amer: >60 mL/min (ref >60)
GFR calc non Af Amer: 59 mL/min — ABNORMAL LOW (ref >60)
GFR calc non Af Amer: >60 mL/min (ref >60)
GLUCOSE: 111 mg/dL — AB (ref 70–99)
Glucose, Bld: 224 mg/dL — ABNORMAL HIGH (ref 70–99)
Glucose, Bld: 197 mg/dL — ABNORMAL HIGH (ref 70–99)
Glucose, Bld: 210 mg/dL — ABNORMAL HIGH (ref 70–99)
Potassium: 4.3 mmol/L (ref 3.5–5.1)
Potassium: 4.6 mmol/L (ref 3.5–5.1)
Potassium: 4.6 mmol/L (ref 3.5–5.1)
Potassium: 3.7 mmol/L (ref 3.5–5.1)
Sodium: 137 mmol/L (ref 135–145)
Sodium: 140 mmol/L (ref 135–145)
Sodium: 139 mmol/L (ref 135–145)
Sodium: 137 mmol/L (ref 135–145)

## 2018-03-22 LAB — GLUCOSE, CAPILLARY
GLUCOSE-CAPILLARY: 133 mg/dL — AB (ref 70–99)
GLUCOSE-CAPILLARY: 140 mg/dL — AB (ref 70–99)
GLUCOSE-CAPILLARY: 191 mg/dL — AB (ref 70–99)
GLUCOSE-CAPILLARY: 219 mg/dL — AB (ref 70–99)
GLUCOSE-CAPILLARY: 80 mg/dL (ref 70–99)
GLUCOSE-CAPILLARY: 86 mg/dL (ref 70–99)
GLUCOSE-CAPILLARY: 99 mg/dL (ref 70–99)
Glucose-Capillary: 178 mg/dL — ABNORMAL HIGH (ref 70–99)
Glucose-Capillary: 147 mg/dL — ABNORMAL HIGH (ref 70–99)
Glucose-Capillary: 168 mg/dL — ABNORMAL HIGH (ref 70–99)
Glucose-Capillary: 142 mg/dL — ABNORMAL HIGH (ref 70–99)
Glucose-Capillary: 181 mg/dL — ABNORMAL HIGH (ref 70–99)
Glucose-Capillary: 190 mg/dL — ABNORMAL HIGH (ref 70–99)
Glucose-Capillary: 154 mg/dL — ABNORMAL HIGH (ref 70–99)
Glucose-Capillary: 85 mg/dL (ref 70–99)
Glucose-Capillary: 246 mg/dL — ABNORMAL HIGH (ref 70–99)
Glucose-Capillary: 225 mg/dL — ABNORMAL HIGH (ref 70–99)

## 2018-03-22 LAB — CBG MONITORING, ED
GLUCOSE-CAPILLARY: 317 mg/dL — AB (ref 70–99)
GLUCOSE-CAPILLARY: 193 mg/dL — AB (ref 70–99)
Glucose-Capillary: 241 mg/dL — ABNORMAL HIGH (ref 70–99)
Glucose-Capillary: 388 mg/dL — ABNORMAL HIGH (ref 70–99)

## 2018-03-22 LAB — CBC WITH DIFFERENTIAL/PLATELET
Basophils Absolute: 0 10*3/uL (ref 0.0–0.1)
Basophils Relative: 0 %
EOS PCT: 0 %
Eosinophils Absolute: 0 10*3/uL (ref 0.0–0.7)
HCT: 40.9 % (ref 39.0–52.0)
Hemoglobin: 12.8 g/dL — ABNORMAL LOW (ref 13.0–17.0)
Lymphocytes Relative: 5 %
Lymphs Abs: 1.3 10*3/uL (ref 0.7–4.0)
MCH: 24.5 pg — ABNORMAL LOW (ref 26.0–34.0)
MCHC: 31.3 g/dL (ref 30.0–36.0)
MCV: 78.4 fL (ref 78.0–100.0)
MONO ABS: 1.3 10*3/uL — AB (ref 0.1–1.0)
Monocytes Relative: 5 %
Neutro Abs: 24.3 10*3/uL — ABNORMAL HIGH (ref 1.7–7.7)
Neutrophils Relative %: 90 %
PLATELETS: 232 10*3/uL (ref 150–400)
RBC: 5.22 MIL/uL (ref 4.22–5.81)
RDW: 13.8 % (ref 11.5–15.5)
WBC: 26.9 10*3/uL — AB (ref 4.0–10.5)

## 2018-03-22 LAB — LACTIC ACID, PLASMA
Lactic Acid, Venous: 1.5 mmol/L (ref 0.5–1.9)
Lactic Acid, Venous: 1.6 mmol/L (ref 0.5–1.9)

## 2018-03-22 LAB — MRSA PCR SCREENING: MRSA by PCR: NEGATIVE

## 2018-03-22 MED ORDER — INSULIN ASPART PROT & ASPART (70-30 MIX) 100 UNIT/ML ~~LOC~~ SUSP
15.0000 [IU] | Freq: Two times a day (BID) | SUBCUTANEOUS | Status: DC
  Administered 2018-03-22 – 2018-03-23 (×2): 15 [IU] via SUBCUTANEOUS
  Filled 2018-03-22: qty 10

## 2018-03-22 MED ORDER — PROMETHAZINE HCL 25 MG/ML IJ SOLN
12.5000 mg | Freq: Four times a day (QID) | INTRAMUSCULAR | Status: DC | PRN
Start: 2018-03-22 — End: 2018-03-25
  Administered 2018-03-22 (×2): 12.5 mg via INTRAVENOUS
  Filled 2018-03-22 (×2): qty 1

## 2018-03-22 MED ORDER — METOPROLOL TARTRATE 5 MG/5ML IV SOLN
2.5000 mg | Freq: Once | INTRAVENOUS | Status: AC
Start: 2018-03-22 — End: 2018-03-22
  Administered 2018-03-22: 2.5 mg via INTRAVENOUS
  Filled 2018-03-22: qty 5

## 2018-03-22 MED ORDER — SODIUM CHLORIDE 0.9 % IV SOLN
INTRAVENOUS | Status: DC
  Administered 2018-03-22 – 2018-03-25 (×5): via INTRAVENOUS

## 2018-03-22 MED ORDER — SODIUM CHLORIDE 0.45 % IV BOLUS
500.0000 mL | Freq: Once | INTRAVENOUS | Status: AC
  Administered 2018-03-22: 500 mL via INTRAVENOUS

## 2018-03-22 MED ORDER — ORAL CARE MOUTH RINSE
15.0000 mL | Freq: Two times a day (BID) | OROMUCOSAL | Status: DC
  Administered 2018-03-22 – 2018-03-25 (×7): 15 mL via OROMUCOSAL

## 2018-03-22 MED ORDER — INSULIN ASPART 100 UNIT/ML ~~LOC~~ SOLN
0.0000 [IU] | SUBCUTANEOUS | Status: DC
  Administered 2018-03-22 – 2018-03-24 (×4): 1 [IU] via SUBCUTANEOUS
  Administered 2018-03-24 (×3): 2 [IU] via SUBCUTANEOUS
  Administered 2018-03-25 (×3): 3 [IU] via SUBCUTANEOUS

## 2018-03-22 NOTE — ED Notes (Signed)
CBG 193. 

## 2018-03-22 NOTE — Progress Notes (Signed)
Report given to [name] on 5W. Pt transported without incident. Attempted to notify pt's mother of transfer, but phone number provided not working.

## 2018-03-22 NOTE — Progress Notes (Signed)
PROGRESS NOTE    [name]  ZOX:[id number] DOB: 1976/04/22 DOA: 03/21/2018 PCP: Patient, No Pcp Per   Brief Narrative: [name] is a 42 y.o. male with medical history significant for uncontrolled diabetes mellitus and other comorbidities, now presenting to the emergency department with 2 to 3 days of abdominal discomfort, malaise, and nausea with nonbloody vomiting.  Patient reports he been in his usual state of health until 2 to 3 days ago when he noted the insidious development of a general malaise and abdominal discomfort with nausea.  He started experiencing worsening in these symptoms and has had recurrent nonbloody vomiting.   He reported to the admitting physician that he is generally adherent with his insulin regimen, but not in the last couple days because he was feeling sick. He is unable to state how much insulin he takes or how often, reporting that he has it written down at home.    Upon arrival to the ED, Urinalysis is notable for glucosuria and ketonuria. Chemistry panel features a slight hyponatremia, bicarbonate of 12, anion gap 23, glucose 459, and creatinine of 1.80, up from 1.02 years ago. CBC featured a leukocytosis to 25,900 and a hemoglobin of 12.9.  Initial lactic acid is elevated to 3.46 and troponin is normal.  Patient was treated with 2 L normal saline and started on insulin infusion in the ED. Tachycardia has improved somewhat with the IV fluids, blood pressure remained stable, and he was admitted to the stepdown unit for ongoing evaluation and management of DKA.  Assessment & Plan:   Principal Problem:   DKA (diabetic ketoacidoses) (HCC) Active Problems:   AKI (acute kidney injury) (HCC)   Leukocytosis   Normocytic anemia   Increased anion gap metabolic acidosis   Hyperbilirubinemia   DKA; insulin-dependent DM  -Presented with abdominal discomfort, malaise, and N/V  -Found to be in DKA, was fluid-resuscitated with 2 liters NS and started on insulin  infusion  -Most likely related to poor adherence to his insulin regimen given A1c values in 11-15 range and his inability to say how much insulin he uses or how often; there is no evidence for infection, CVA, or MI  -Repeat HbA1c this Visit  -Continue insulin infusion with frequent CBG's, continue IVF hydration, follow serial chem panels   -C/w Supportive Care with IV Zofran 4 mg q6hprn for Nausea/Vomiting -Will keep Patient NPO at this time and place on Carb Modified Diet when ready to transition to Long Acting Insulin  -Continue IV Fluid resuscitation with normal saline rate 125 mL/hr and position to D5 half-normal saline at a rate of 75 mL's per hour once patient's blood sugars are less than 250 -Transition to Long Acting Insulin once Gap is closed and CO2 >20 x2 occasions on serial BMP's -CBG's now ranging from 142-191 -Consult Diabetes education coordinator for further assistance in management of this patient's Diabetes  -These education coordinator states on criteria admit for transitioning to subcu long-acting give the 70 3015 units twice daily 2 hours prior to discontinuation of Insulin drip.  Acute Kidney Injury  - SCr is 1.80 on admission (BUN was 34), up from 1.0 previously  - Likely an acute prerenal azotemia in setting of DKA   - He was fluid-resuscitated in ED with 2 liters NS and will be continued on IVF hydration  - BUN/Cr was improving and repeat BMP showed BUN/Cr of 33/1.54 but now slightly worsened to 30/1.44 -C/w IVF Resuscitation with D5W 1/2 NS at a rate of  75 mL/hr now that Blood Sugars are improving  -Continue Following serial chem panels as above and repeat CMP in AM    Leukocytosis  - No fever, and no evidence for infection on UA or CXR  - Suspect this is reactive to DKA; there is also likely hemoconcentration from Dehydration -WBC went from 25.9 -> 26.9 - Culture if febrile, repeat CBC in am    Normocytic Anemia -Patient's Hb/Hct went from 12.9/41.7 ->  12.8/40.9 -Check Anemia Panel in AM -Continue to Monitor for S/Sx of Bleeding -Repeat CBC in AM  Anion Gap Metabolic Acidosis, improving  -In the Setting of DKA -Patient's CO2 this AM was 19 and AG improved to 12 (was 23 on Admission) -Continue to Monitor Serial Chem Panels  Hyperbilirubinemia -Patient's T Bili was 1.4 -Continue to Monitor and Repeat CMP in AM   DVT prophylaxis: Heparin 5,000 units sq q8h Code Status: FULL CODE Family Communication: No family present at bedside  Disposition Plan: Anticipate D/C Home when medically stable; Remain in SDU  Consultants:   None  Procedures:   None   Antimicrobials:  Anti-infectives (From admission, onward)   None     Subjective: Seen and examined at bedside and patient appeared withdrawn and states he did not feel very good and was nauseous.  No lightheadedness or dizziness.  Denies any abdominal pain today.  No other concerns or complaints at this time and wanted to rest.  Objective: Vitals:   03/22/18 0800 03/22/18 1000 03/22/18 1113 03/22/18 1200  BP: 135/85 133/89  (!) 146/93  Pulse: (!) 109 (!) 104  (!) 110  Resp: 13 13  15   Temp:   98.8 F (37.1 C)   TempSrc:   Oral   SpO2: 99% 99%  99%  Weight:      Height:        Intake/Output Summary (Last 24 hours) at 03/22/2018 1433 Last data filed at 03/22/2018 1203 Gross per 24 hour  Intake 2662.64 ml  Output 1625 ml  Net 1037.64 ml   Filed Weights   03/21/18 2039 03/22/18 0445  Weight: 63.5 kg (140 lb) 55.1 kg (121 lb 7.6 oz)   Examination: Physical Exam:  Constitutional: Thin AAM in NAD and appears calm but uncomfortable Eyes: ids and conjunctivae normal, sclerae anicteric  ENMT: External Ears, Nose appear normal. Grossly normal hearing.  Neck: Appears normal, supple, no cervical masses, normal ROM, no appreciable thyromegaly, no JVD Respiratory: Diminished to auscultation bilaterally, no wheezing, rales, rhonchi or crackles. Normal respiratory effort and  patient is not tachypenic. No accessory muscle use.  Cardiovascular: Tachycardic Rate and Rhythm, no murmurs / rubs / gallops. S1 and S2 auscultated. No extremity edema.  Abdomen: Soft, slightly tender, non-distended. No masses palpated. No appreciable hepatosplenomegaly. Bowel sounds positive x4.  GU: Deferred. Musculoskeletal: No clubbing / cyanosis of digits/nails. No joint deformity upper and lower extremities.  Skin: No rashes, lesions, ulcers on a limited skin evaluation. No induration; Warm and dry.  Neurologic: CN 2-12 grossly intact with no focal deficits. Romberg sign and cerebellar reflexes not assessed.  Psychiatric: Normal judgment and insight. Alert and oriented x 3. Withdrawn mood and appropriate affect.   Data Reviewed: I have personally reviewed following labs and imaging studies  CBC: Recent Labs  Lab 03/21/18 2035 03/22/18 0313  WBC 25.9* 26.9*  NEUTROABS  --  24.3*  HGB 12.9* 12.8*  HCT 41.7 40.9  MCV 78.4 78.4  PLT 256 232   Basic Metabolic Panel: Recent Labs  Lab  03/21/18 2035 03/22/18 0313 03/22/18 0728 03/22/18 1129  NA 134* 137 140 139  K 5.0 4.3 4.6 4.6  CL 99 108 110 108  CO2 12* 15* 19* 19*  GLUCOSE 459* 224* 111* 197*  BUN 34* 33* 32* 30*  CREATININE 1.80* 1.54* 1.39* 1.44*  CALCIUM 9.5 9.2 9.4 9.3   GFR: Estimated Creatinine Clearance: 52.6 mL/min (A) (by C-G formula based on SCr of 1.44 mg/dL (H)). Liver Function Tests: Recent Labs  Lab 03/21/18 2035  AST 27  ALT 33  ALKPHOS 101  BILITOT 1.4*  PROT 7.8  ALBUMIN 4.2   Recent Labs  Lab 03/21/18 2035  LIPASE 17   No results for input(s): AMMONIA in the last 168 hours. Coagulation Profile: No results for input(s): INR, PROTIME in the last 168 hours. Cardiac Enzymes: No results for input(s): CKTOTAL, CKMB, CKMBINDEX, TROPONINI in the last 168 hours. BNP (last 3 results) No results for input(s): PROBNP in the last 8760 hours. HbA1C: No results for input(s): HGBA1C in the last  72 hours. CBG: Recent Labs  Lab 03/22/18 0903 03/22/18 0956 03/22/18 1112 03/22/18 1202 03/22/18 1308  GLUCAP 142* 181* 191* 190* 154*   Lipid Profile: No results for input(s): CHOL, HDL, LDLCALC, TRIG, CHOLHDL, LDLDIRECT in the last 72 hours. Thyroid Function Tests: No results for input(s): TSH, T4TOTAL, FREET4, T3FREE, THYROIDAB in the last 72 hours. Anemia Panel: No results for input(s): VITAMINB12, FOLATE, FERRITIN, TIBC, IRON, RETICCTPCT in the last 72 hours. Sepsis Labs: Recent Labs  Lab 03/21/18 2106 03/21/18 2327 03/22/18 0313 03/22/18 0400  LATICACIDVEN 3.46* 2.12* 1.5 1.6    Recent Results (from the past 240 hour(s))  MRSA PCR Screening     Status: None   Collection Time: 03/22/18  4:57 AM  Result Value Ref Range Status   MRSA by PCR NEGATIVE NEGATIVE Final    Comment:        The GeneXpert MRSA Assay (FDA approved for NASAL specimens only), is one component of a comprehensive MRSA colonization surveillance program. It is not intended to diagnose MRSA infection nor to guide or monitor treatment for MRSA infections. Performed at [facility], [address] [place], [place] [address]     Radiology Studies: Dg Chest 2 View  Result Date: 03/21/2018 CLINICAL DATA:  Tachycardia EXAM: CHEST - 2 VIEW COMPARISON:  12/16/2015 FINDINGS: Hyperinflation. No acute airspace disease or pleural effusion. Normal heart size. No pneumothorax. IMPRESSION: No active cardiopulmonary disease.  Hyperinflation Electronically Signed   By: Jasmine Pang M.D.   On: 03/21/2018 22:33   Scheduled Meds: . heparin  5,000 Units Subcutaneous Q8H  . mouth rinse  15 mL Mouth Rinse BID   Continuous Infusions: . dextrose 5 % and 0.45% NaCl 75 mL/hr at 03/22/18 1203  . insulin (NOVOLIN-R) infusion 0.8 Units/hr (03/22/18 1406)  . sodium chloride      LOS: 1 day   Merlene Laughter, DO [facility] Pager [phone]  If 7PM-7AM, please contact  night-coverage [email or web address] Password [facility] 03/22/2018, 2:33 PM

## 2018-03-22 NOTE — Progress Notes (Signed)
Inpatient Diabetes Program Recommendations  AACE/ADA: New Consensus Statement on Inpatient Glycemic Control (2015)  Target Ranges:  Prepandial:   less than 140 mg/dL      Peak postprandial:   less than 180 mg/dL (1-2 hours)      Critically ill patients:  140 - 180 mg/dL   Lab Results  Component Value Date   GLUCAP 191 (H) 03/22/2018   HGBA1C 12.7 01/26/2016    Review of Glycemic Control  Diabetes history: DM2 Outpatient Diabetes medications:  Current orders for Inpatient glycemic control: IV insulin per DKA   HgbA1C - pending  Inpatient Diabetes Program Recommendations:     When criteria met for transitioning to SQ, give insulin 2 hours prior to discontinuation of drip. 70/30 15 units bid.  Pt can be discharged on Novolin (ReliOn) 70/30 insulin pen ([employer] $42.88 for 5 pens)  Will see pt in am.   Thank you. Lorenda Peck, RD, LDN, CDE Inpatient Diabetes Coordinator [phone]

## 2018-03-22 NOTE — ED Notes (Signed)
Rep;ort given to rn on 2h 

## 2018-03-23 DIAGNOSIS — R079 Chest pain, unspecified: Secondary | ICD-10-CM

## 2018-03-23 LAB — GLUCOSE, CAPILLARY
GLUCOSE-CAPILLARY: 98 mg/dL (ref 70–99)
GLUCOSE-CAPILLARY: 46 mg/dL — AB (ref 70–99)
GLUCOSE-CAPILLARY: 90 mg/dL (ref 70–99)
GLUCOSE-CAPILLARY: 160 mg/dL — AB (ref 70–99)
Glucose-Capillary: 169 mg/dL — ABNORMAL HIGH (ref 70–99)
Glucose-Capillary: 179 mg/dL — ABNORMAL HIGH (ref 70–99)
Glucose-Capillary: 65 mg/dL — ABNORMAL LOW (ref 70–99)
Glucose-Capillary: 76 mg/dL (ref 70–99)
Glucose-Capillary: 90 mg/dL (ref 70–99)

## 2018-03-23 LAB — CBC WITH DIFFERENTIAL/PLATELET
BASOS ABS: 0 10*3/uL (ref 0.0–0.1)
BASOS PCT: 0 %
EOS ABS: 0.2 10*3/uL (ref 0.0–0.7)
EOS PCT: 1 %
HCT: 38.2 % — ABNORMAL LOW (ref 39.0–52.0)
Hemoglobin: 12.3 g/dL — ABNORMAL LOW (ref 13.0–17.0)
LYMPHS PCT: 9 %
Lymphs Abs: 1.5 10*3/uL (ref 0.7–4.0)
MCH: 24.6 pg — ABNORMAL LOW (ref 26.0–34.0)
MCHC: 32.2 g/dL (ref 30.0–36.0)
MCV: 76.2 fL — AB (ref 78.0–100.0)
Monocytes Absolute: 1 10*3/uL (ref 0.1–1.0)
Monocytes Relative: 6 %
Neutro Abs: 13.5 10*3/uL — ABNORMAL HIGH (ref 1.7–7.7)
Neutrophils Relative %: 84 %
PLATELETS: 219 10*3/uL (ref 150–400)
RBC: 5.01 MIL/uL (ref 4.22–5.81)
RDW: 14 % (ref 11.5–15.5)
WBC: 16.4 10*3/uL — AB (ref 4.0–10.5)

## 2018-03-23 LAB — COMPREHENSIVE METABOLIC PANEL
ALBUMIN: 3.5 g/dL (ref 3.5–5.0)
ALT: 22 U/L (ref 0–44)
AST: 26 U/L (ref 15–41)
Alkaline Phosphatase: 77 U/L (ref 38–126)
Anion gap: 9 (ref 5–15)
BUN: 26 mg/dL — AB (ref 6–20)
CHLORIDE: 109 mmol/L (ref 98–111)
CO2: 22 mmol/L (ref 22–32)
CREATININE: 1.27 mg/dL — AB (ref 0.61–1.24)
Calcium: 8.7 mg/dL — ABNORMAL LOW (ref 8.9–10.3)
GFR calc Af Amer: >60 mL/min (ref >60)
GFR calc non Af Amer: >60 mL/min (ref >60)
GLUCOSE: 77 mg/dL (ref 70–99)
POTASSIUM: 3.9 mmol/L (ref 3.5–5.1)
SODIUM: 140 mmol/L (ref 135–145)
Total Bilirubin: 1.3 mg/dL — ABNORMAL HIGH (ref 0.3–1.2)
Total Protein: 6.4 g/dL — ABNORMAL LOW (ref 6.5–8.1)

## 2018-03-23 LAB — MAGNESIUM: Magnesium: 2.1 mg/dL (ref 1.7–2.4)

## 2018-03-23 LAB — URINE CULTURE

## 2018-03-23 LAB — RETICULOCYTES
RBC.: 5.01 MIL/uL (ref 4.22–5.81)
Retic Count, Absolute: 40.1 10*3/uL (ref 19.0–186.0)
Retic Ct Pct: 0.8 % (ref 0.4–3.1)

## 2018-03-23 LAB — FOLATE: Folate: 18 ng/mL (ref >5.9)

## 2018-03-23 LAB — HIV ANTIBODY (ROUTINE TESTING W REFLEX): HIV Screen 4th Generation wRfx: NONREACTIVE

## 2018-03-23 LAB — IRON AND TIBC
IRON: 165 ug/dL (ref 45–182)
Saturation Ratios: 57 % — ABNORMAL HIGH (ref 17.9–39.5)
TIBC: 288 ug/dL (ref 250–450)
UIBC: 123 ug/dL

## 2018-03-23 LAB — TROPONIN I
Troponin I: <0.03 ng/mL (ref <0.03)
Troponin I: <0.03 ng/mL (ref <0.03)
Troponin I: <0.03 ng/mL (ref <0.03)

## 2018-03-23 LAB — FERRITIN: FERRITIN: 164 ng/mL (ref 24–336)

## 2018-03-23 LAB — HEMOGLOBIN A1C
Hgb A1c MFr Bld: 11.7 % — ABNORMAL HIGH (ref 4.8–5.6)
Mean Plasma Glucose: 289.09 mg/dL

## 2018-03-23 LAB — PHOSPHORUS: Phosphorus: 2.4 mg/dL — ABNORMAL LOW (ref 2.5–4.6)

## 2018-03-23 LAB — VITAMIN B12: Vitamin B-12: 1792 pg/mL — ABNORMAL HIGH (ref 180–914)

## 2018-03-23 MED ORDER — NITROGLYCERIN 0.4 MG SL SUBL: 0.4000 mg | SUBLINGUAL_TABLET | SUBLINGUAL | Status: DC | PRN

## 2018-03-23 MED ORDER — DEXTROSE 50 % IV SOLN
INTRAVENOUS | Status: AC
  Filled 2018-03-23: qty 50

## 2018-03-23 MED ORDER — INSULIN ASPART PROT & ASPART (70-30 MIX) 100 UNIT/ML ~~LOC~~ SUSP
10.0000 [IU] | Freq: Two times a day (BID) | SUBCUTANEOUS | Status: DC
  Filled 2018-03-23: qty 10

## 2018-03-23 MED ORDER — METOPROLOL TARTRATE 5 MG/5ML IV SOLN
2.5000 mg | Freq: Once | INTRAVENOUS | Status: AC
  Administered 2018-03-23: 2.5 mg via INTRAVENOUS
  Filled 2018-03-23: qty 5

## 2018-03-23 MED ORDER — INSULIN GLARGINE 100 UNIT/ML ~~LOC~~ SOLN
10.0000 [IU] | Freq: Every day | SUBCUTANEOUS | Status: DC
  Administered 2018-03-23: 10 [IU] via SUBCUTANEOUS
  Filled 2018-03-23 (×3): qty 0.1

## 2018-03-23 MED ORDER — K PHOS MONO-SOD PHOS DI & MONO 155-852-130 MG PO TABS
500.0000 mg | ORAL_TABLET | Freq: Once | ORAL | Status: AC
  Administered 2018-03-23: 500 mg via ORAL
  Filled 2018-03-23: qty 2

## 2018-03-23 MED ORDER — MENTHOL 3 MG MT LOZG
1.0000 | LOZENGE | OROMUCOSAL | Status: DC | PRN
  Filled 2018-03-23: qty 9

## 2018-03-23 MED ORDER — MORPHINE SULFATE (PF) 2 MG/ML IV SOLN
2.0000 mg | Freq: Once | INTRAVENOUS | Status: AC
  Administered 2018-03-23: 2 mg via INTRAVENOUS
  Filled 2018-03-23: qty 1

## 2018-03-23 MED ORDER — ALUM & MAG HYDROXIDE-SIMETH 200-200-20 MG/5ML PO SUSP
30.0000 mL | Freq: Four times a day (QID) | ORAL | Status: DC | PRN
Start: 2018-03-23 — End: 2018-03-25
  Filled 2018-03-23: qty 30

## 2018-03-23 NOTE — Progress Notes (Addendum)
PROGRESS NOTE    [name]  WUJ:[id number] DOB: 07/23/1976 DOA: 03/21/2018 PCP: Patient, No Pcp Per   Brief Narrative: [name] is a 42 y.o. male with medical history significant for uncontrolled diabetes mellitus and other comorbidities, now presenting to the emergency department with 2 to 3 days of abdominal discomfort, malaise, and nausea with nonbloody vomiting.  Patient reports he been in his usual state of health until 2 to 3 days ago when he noted the insidious development of a general malaise and abdominal discomfort with nausea.  He started experiencing worsening in these symptoms and has had recurrent nonbloody vomiting.   He reported to the admitting physician that he is generally adherent with his insulin regimen, but not in the last couple days because he was feeling sick. He is unable to state how much insulin he takes or how often, reporting that he has it written down at home.    Upon arrival to the ED, Urinalysis is notable for glucosuria and ketonuria. Chemistry panel features a slight hyponatremia, bicarbonate of 12, anion gap 23, glucose 459, and creatinine of 1.80, up from 1.02 years ago. CBC featured a leukocytosis to 25,900 and a hemoglobin of 12.9.  Initial lactic acid is elevated to 3.46 and troponin is normal.  Patient was treated with 2 L normal saline and started on insulin infusion in the ED. Tachycardia has improved somewhat with the IV fluids, blood pressure remained stable, and he was admitted to the stepdown unit for ongoing evaluation and management of DKA.  Assessment & Plan:   Principal Problem:   DKA (diabetic ketoacidoses) (HCC) Active Problems:   AKI (acute kidney injury) (HCC)   Leukocytosis   Normocytic anemia   Increased anion gap metabolic acidosis   Hyperbilirubinemia   DKA; insulin-dependent DM; DKA improved  -Presented with abdominal discomfort, malaise, and N/V  -Found to be in DKA, was fluid-resuscitated with 2 liters NS and  started on insulin infusion  -Most likely related to poor adherence to his insulin regimen given A1c values in 11-15 range and his inability to say how much insulin he uses or how often; there is no evidence for infection, CVA, or MI  -Repeat HbA1c this Visit was 11.7 -C/w Supportive Care with IV Zofran 4 mg q6hprn for Nausea/Vomiting -Will keep Patient NPO at this time and place on Carb Modified Diet when ready to transition to Long Acting Insulin  -Continued IV Fluid resuscitation with normal saline rate 125 mL/hr and position to D5 half-normal saline at a rate of 75 mL's per hour once patient's blood sugars are less than 250 -Now back on NS at a rate of 75 mL/hr and will continue  -Transitioned to Long Acting Insulin once Gap is closed and CO2 >20 x2 occasions on serial BMP's -CBG's now ranging from 46-169 and patient remaining Hypoglycemic (Not Eating properly) -Consulted Diabetes education coordinator for further assistance in management of this patient's Diabetes  -Diabetes Education coordinator states on criteria admit for transitioning to subcu long-acting give the 70/30 15 units twice daily 2 hours prior to discontinuation of Insulin drip. -Patient had a Low of 46 but is not eating much because of painful swallowing -Reduced 70/30 to 10 units BID but then changed to Lantus 10 units sq qHS given that he is not eating properly and continue Sensitive Novolog SSI q4h  Acute Kidney Injury  - SCr is 1.80 on admission (BUN was 34), up from 1.0 previously  - Likely an acute prerenal azotemia  in setting of DKA   - He was fluid-resuscitated in ED with 2 liters NS and will be continued on IVF hydration  - BUN/Cr was improving and repeat BMP showed BUN/Cr of 33/1.54 but now is 26/1.27 -C/w IVF Resuscitation NS at a rate of 75 mL/hr now that Blood Sugars are improving  -Continue Following serial chem panels as above and repeat CMP in AM    Leukocytosis, improving  - No fever, and no evidence for  infection on UA or CXR  -Suspect this is reactive to DKA; there is also likely hemoconcentration from Dehydration -WBC went from 25.9 -> 26.9 -> 16.4 - Culture if febrile, repeat CBC in am    Normocytic Anemia -Patient's Hb/Hct went from 12.9/41.7 -> 12.8/40.9 -> 12.3/38.2 -Checked Anemia Panel and showed an iron level of 165, U IBC level 123, TIBC of 288, saturation ratio 57%, ferritin level 164, folate level of 18.0 and vitamin B12 level of 1792 -Continue to Monitor for S/Sx of Bleeding -Repeat CBC in AM  Anion Gap Metabolic Acidosis, improved -In the Setting of DKA -Patient's CO2 this AM was 22 and AG improved to 9 (was 23 on Admission) -Continue to Monitor and Repeat CMP in AM   Hyperbilirubinemia -Patient's T Bili was 1.4 and is now 1.3 -Continue to Monitor and Repeat CMP in AM   Hypophosphatemia -Patient's phosphorus level this morning is 2.4 -Replete with p.o. K-Phos Neutral 500 mg x 1 -Continue to monitor and replete as necessary -Repeat phosphorus level in a.m.   Chest Pain -In the setting of hypoglycemia -Troponin x2 is less than 0.03 -EKG showed a Sinus Rhythm at a rate of 95 with no evidence of ST Elevation or Depression on my interpretation -Continue with nitroglycerin 0.4 mg sublingual every 5 minutes as needed chest pain -Patient was also given IV morphine 2 mg once yesterday -Continue to Monitor closely   Odynophagia/Painful Swallowing -States his throat hurts ever since vomiting that much -Get SLP to evaluate and Treat -Will try Cepacol Lozenges   DVT prophylaxis: Heparin 5,000 units sq q8h Code Status: FULL CODE Family Communication: No family present at bedside  Disposition Plan: Anticipate D/C Home when medically stable; Remain in SDU  Consultants:   None  Procedures:   None   Antimicrobials:  Anti-infectives (From admission, onward)   None     Subjective: Seen and examined at bedside proved from yesterday but still appeared slightly  withdrawn and stated that his throat was hurting from vomiting so much.  No chest pain today but had some overnight.  No lightheadedness or dizziness.  States he was eating very little because of the pain in his throat.  No other complaints or concerns at this time  Objective: Vitals:   03/22/18 2020 03/22/18 2356 03/23/18 0404 03/23/18 0757  BP: (!) 142/104 126/84 132/85   Pulse: (!) 118 (!) 111 (!) 109   Resp: 17 12 15    Temp: 98.4 F (36.9 C) 98.7 F (37.1 C) 98.3 F (36.8 C) 98.7 F (37.1 C)  TempSrc: Oral Oral Oral Oral  SpO2: 99% 96% 98%   Weight:      Height:        Intake/Output Summary (Last 24 hours) at 03/23/2018 1455 Last data filed at 03/23/2018 1255 Gross per 24 hour  Intake 375.04 ml  Output 575 ml  Net -199.96 ml   Filed Weights   03/21/18 2039 03/22/18 0445  Weight: 63.5 kg (140 lb) 55.1 kg (121 lb 7.6 oz)  Examination: Physical Exam:  Constitutional: Thin AAM in NAD appears calm but states his throat hurts  Eyes: Sclerae anicteric. Lids and Conjunctivae normal  ENMT: External Ears and Nose appear normal. Grossly normal hearing Neck: Supple with no JVD Respiratory: Diminished to auscultation bilaterally with no appreciable wheezing, rales, rhonchi. Unlabored Breathing  Cardiovascular: Tachycardic rate but regular rhythm.  No appreciable murmurs, rubs, gallops.  No lower extremity edema noted Abdomen: Soft, nontender, nondistended.  Bowel sounds present all 4 quadrants GU: Deferred Musculoskeletal: No clubbing or cyanosis.  No joint deformities noted Skin: No appreciable rashes or lesions on limited skin evaluation Neurologic: Cranial nerves II through XII grossly intact and no appreciable focal deficits noted. Psychiatric: Normal judgment and insight.  Patient is awake and alert and oriented x3 but appears withdrawn again  Data Reviewed: I have personally reviewed following labs and imaging studies  CBC: Recent Labs  Lab 03/21/18 2035 03/22/18 0313  03/23/18 0506  WBC 25.9* 26.9* 16.4*  NEUTROABS  --  24.3* 13.5*  HGB 12.9* 12.8* 12.3*  HCT 41.7 40.9 38.2*  MCV 78.4 78.4 76.2*  PLT 256 232 219   Basic Metabolic Panel: Recent Labs  Lab 03/22/18 0313 03/22/18 0728 03/22/18 1129 03/22/18 2044 03/23/18 0506  NA 137 140 139 137 140  K 4.3 4.6 4.6 3.7 3.9  CL 108 110 108 108 109  CO2 15* 19* 19* 20* 22  GLUCOSE 224* 111* 197* 210* 77  BUN 33* 32* 30* 30* 26*  CREATININE 1.54* 1.39* 1.44* 1.42* 1.27*  CALCIUM 9.2 9.4 9.3 9.0 8.7*  MG  --   --   --   --  2.1  PHOS  --   --   --   --  2.4*   GFR: Estimated Creatinine Clearance: 59.7 mL/min (A) (by C-G formula based on SCr of 1.27 mg/dL (H)). Liver Function Tests: Recent Labs  Lab 03/21/18 2035 03/23/18 0506  AST 27 26  ALT 33 22  ALKPHOS 101 77  BILITOT 1.4* 1.3*  PROT 7.8 6.4*  ALBUMIN 4.2 3.5   Recent Labs  Lab 03/21/18 2035  LIPASE 17   No results for input(s): AMMONIA in the last 168 hours. Coagulation Profile: No results for input(s): INR, PROTIME in the last 168 hours. Cardiac Enzymes: Recent Labs  Lab 03/23/18 0506 03/23/18 1016  TROPONINI <0.03 <0.03   BNP (last 3 results) No results for input(s): PROBNP in the last 8760 hours. HbA1C: Recent Labs    03/23/18 0506  HGBA1C 11.7*   CBG: Recent Labs  Lab 03/23/18 0406 03/23/18 0445 03/23/18 0728 03/23/18 1252 03/23/18 1258  GLUCAP 46* 76 169* <10* 90   Lipid Profile: No results for input(s): CHOL, HDL, LDLCALC, TRIG, CHOLHDL, LDLDIRECT in the last 72 hours. Thyroid Function Tests: No results for input(s): TSH, T4TOTAL, FREET4, T3FREE, THYROIDAB in the last 72 hours. Anemia Panel: Recent Labs    03/23/18 0506  VITAMINB12 1,792*  FOLATE 18.0  FERRITIN 164  TIBC 288  IRON 165  RETICCTPCT 0.8   Sepsis Labs: Recent Labs  Lab 03/21/18 2106 03/21/18 2327 03/22/18 0313 03/22/18 0400  LATICACIDVEN 3.46* 2.12* 1.5 1.6    Recent Results (from the past 240 hour(s))  Urine  culture     Status: Abnormal   Collection Time: 03/21/18  8:49 PM  Result Value Ref Range Status   Specimen Description URINE, CLEAN CATCH  Final   Special Requests   Final    NONE Performed at [facility], [address] [place], [place] [address]    Culture MULTIPLE SPECIES PRESENT, SUGGEST RECOLLECTION (A)  Final   Report Status 03/23/2018 FINAL  Final  MRSA PCR Screening     Status: None   Collection Time: 03/22/18  4:57 AM  Result Value Ref Range Status   MRSA by PCR NEGATIVE NEGATIVE Final    Comment:        The GeneXpert MRSA Assay (FDA approved for NASAL specimens only), is one component of a comprehensive MRSA colonization surveillance program. It is not intended to diagnose MRSA infection nor to guide or monitor treatment for MRSA infections. Performed at [facility], [address] [place], [place] [address]     Radiology Studies: Dg Chest 2 View  Result Date: 03/21/2018 CLINICAL DATA:  Tachycardia EXAM: CHEST - 2 VIEW COMPARISON:  12/16/2015 FINDINGS: Hyperinflation. No acute airspace disease or pleural effusion. Normal heart size. No pneumothorax. IMPRESSION: No active cardiopulmonary disease.  Hyperinflation Electronically Signed   By: Jasmine Pang M.D.   On: 03/21/2018 22:33   Scheduled Meds: . heparin  5,000 Units Subcutaneous Q8H  . insulin aspart  0-9 Units Subcutaneous Q4H  . insulin aspart protamine- aspart  15 Units Subcutaneous BID WC  . mouth rinse  15 mL Mouth Rinse BID   Continuous Infusions: . sodium chloride 75 mL/hr at 03/23/18 1300    LOS: 2 days   Merlene Laughter, DO [facility] Pager [phone]  If 7PM-7AM, please contact night-coverage [email or web address] Password [email or web address] 03/23/2018, 2:55 PM

## 2018-03-23 NOTE — Progress Notes (Signed)
Hypoglycemic Event  CBG:  46 @0406   Treatment:  2 cups of orange juice  Symptoms:  Asymptomatic, but after drinking orange juice pt started complaining of chest pain and SOB  Follow-up CBG: Time:  0445 CBG Result:  76  Possible Reasons for Event:  Possible over-correction; pt has been NPO until recently when insulin gtt was stopped.    Comments/MD notified:  Notified [facility] C. Bodenheimer, who ordered 2mg  morphine IV, troponin draw, and EKG.  Also ordered Maalox, which pt refused.      Arthur BaltimoreMichael D Gemini Roberts

## 2018-03-23 NOTE — Care Management Note (Addendum)
Case Management Note  Patient Details  Name: [name] MRN: [id number] Date of Birth: 02/01/1976  Subjective/Objective:     Resides with mom. Pt states recently moved back to [place] from [place]. 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          Elvina MattesCarol Manges (Mother)     [phone]        Action/Plan: Transition to home when medically stable. ..Mar[place] Kitchen.NCM following for disposition needs. Pt without PCP, no health insurance...[facility] brochure provided to pt. Pt to f/u and call for an appointment time. Pt states familiar with clinic, was once active with clinic.  Good Rx card provided to pt. Pt states has transportation to home once d/c.  Expected Discharge Date:                  Expected Discharge Plan:  Home/Self Care  In-House Referral:     Discharge planning Services  Follow-up appt scheduled, CM Consult, Indigent [facility] Clinic  Post Acute Care Choice:    Choice offered to:     DME Arranged:   n/a DME Agency:   n/a  HH Arranged:   n/a HH Agency:   n/a  Status of Service:  In process, will continue to follow  If discussed at Long Length of Stay Meetings, dates discussed:    Additional Comments:  [name], [name], RN 03/23/2018, 4:05 PM

## 2018-03-24 LAB — CBC WITH DIFFERENTIAL/PLATELET
Abs Immature Granulocytes: 0 10*3/uL (ref 0.0–0.1)
Basophils Absolute: 0 10*3/uL (ref 0.0–0.1)
Basophils Relative: 0 %
EOS ABS: 0 10*3/uL (ref 0.0–0.7)
EOS PCT: 0 %
HEMATOCRIT: 34.7 % — AB (ref 39.0–52.0)
Hemoglobin: 10.9 g/dL — ABNORMAL LOW (ref 13.0–17.0)
Immature Granulocytes: 0 %
LYMPHS ABS: 2.1 10*3/uL (ref 0.7–4.0)
Lymphocytes Relative: 22 %
MCH: 24.4 pg — AB (ref 26.0–34.0)
MCHC: 31.4 g/dL (ref 30.0–36.0)
MCV: 77.6 fL — AB (ref 78.0–100.0)
MONO ABS: 0.9 10*3/uL (ref 0.1–1.0)
Monocytes Relative: 9 %
Neutro Abs: 6.4 10*3/uL (ref 1.7–7.7)
Neutrophils Relative %: 69 %
Platelets: 199 10*3/uL (ref 150–400)
RBC: 4.47 MIL/uL (ref 4.22–5.81)
RDW: 13.9 % (ref 11.5–15.5)
WBC: 9.4 10*3/uL (ref 4.0–10.5)

## 2018-03-24 LAB — COMPREHENSIVE METABOLIC PANEL
ALBUMIN: 3 g/dL — AB (ref 3.5–5.0)
ALK PHOS: 67 U/L (ref 38–126)
ALT: 18 U/L (ref 0–44)
ANION GAP: 5 (ref 5–15)
AST: 20 U/L (ref 15–41)
BILIRUBIN TOTAL: 1.3 mg/dL — AB (ref 0.3–1.2)
BUN: 17 mg/dL (ref 6–20)
CALCIUM: 8.3 mg/dL — AB (ref 8.9–10.3)
CO2: 26 mmol/L (ref 22–32)
Chloride: 107 mmol/L (ref 98–111)
Creatinine, Ser: 1.01 mg/dL (ref 0.61–1.24)
Glucose, Bld: 150 mg/dL — ABNORMAL HIGH (ref 70–99)
Potassium: 3.5 mmol/L (ref 3.5–5.1)
Sodium: 138 mmol/L (ref 135–145)
TOTAL PROTEIN: 5.8 g/dL — AB (ref 6.5–8.1)

## 2018-03-24 LAB — PHOSPHORUS: Phosphorus: 2.3 mg/dL — ABNORMAL LOW (ref 2.5–4.6)

## 2018-03-24 LAB — GLUCOSE, CAPILLARY
GLUCOSE-CAPILLARY: 177 mg/dL — AB (ref 70–99)
Glucose-Capillary: 100 mg/dL — ABNORMAL HIGH (ref 70–99)
Glucose-Capillary: 143 mg/dL — ABNORMAL HIGH (ref 70–99)
Glucose-Capillary: 143 mg/dL — ABNORMAL HIGH (ref 70–99)
Glucose-Capillary: 164 mg/dL — ABNORMAL HIGH (ref 70–99)
Glucose-Capillary: 174 mg/dL — ABNORMAL HIGH (ref 70–99)

## 2018-03-24 LAB — MAGNESIUM: MAGNESIUM: 2.1 mg/dL (ref 1.7–2.4)

## 2018-03-24 MED ORDER — PHENOL 1.4 % MT LIQD
1.0000 | OROMUCOSAL | Status: DC | PRN
  Administered 2018-03-24: 1 via OROMUCOSAL
  Filled 2018-03-24: qty 177

## 2018-03-24 NOTE — Evaluation (Addendum)
Clinical/Bedside Swallow Evaluation Patient Details  Name: [name] MRN: [id number] Date of Birth: 09/29/1975  Today's Date: 03/24/2018 Time: SLP Start Time (ACUTE ONLY): 0855 SLP Stop Time (ACUTE ONLY): 0910 SLP Time Calculation (min) (ACUTE ONLY): 15 min  Past Medical History:  Past Medical History:  Diagnosis Date  . Diabetes mellitus    Newly diagnosed, May 2012   Past Surgical History:  Past Surgical History:  Procedure Laterality Date  . APPENDECTOMY    . TONSILLECTOMY     HPI:  42 y.o. male with medical history significant for uncontrolled diabetes mellitus, now presenting to the emergency department with 2 to 3 days of abdominal discomfort, malaise, and nausea with nonbloody vomiting.  Patient reports he been in his usual state of health until 2 to 3 days ago when he noted the insidious development of a general malaise and abdominal discomfort with nausea.  He is gone on to experience worsening in these symptoms and has had recurrent nonbloody vomiting.  He denies fevers, chills, chest pain, headache, change in vision or hearing, or focal numbness or weakness.  He reports that he is generally adherent with his insulin regimen, but not in the last couple days because he was feeling sick.  BSE ordered d/t pt c/o "painful swallowing."  Assessment / Plan / Recommendation Clinical Impression   Pt with intermittent nausea during BSE, so a limited assessment was completed; pt c/o "painful swallowing" during intake with potential etiology related to recent vomiting episodes with no baseline dysphagia noted in chart and/or reported by pt; intake of thin via large volume (cup sips, straw) observed without overt s/s of aspiration noted; puree requested by pt, but when SLP brought into room, pt refused d/t nausea; solids unable to be assessed during BSE d/t refusal/continued nausea; pt on current carb-modified diet without dysphagia symptoms reported, but intermittent nausea prohibits full  BSE at this time; continue current diet with ST to f/u x1 for diet tolerance once medical issues subside; thank you for this consult. SLP Visit Diagnosis: Dysphagia, unspecified (R13.10)    Aspiration Risk  Other (comment)(DTA)    Diet Recommendation   Regular (carb modified)/thin liquids  Medication Administration: Other (Comment)(as tolerated)    Other  Recommendations Oral Care Recommendations: Oral care BID   Follow up Recommendations Other (comment)(TBD)      Frequency and Duration min 1 x/week  1 week       Prognosis Prognosis for Safe Diet Advancement: Good      Swallow Study   General Date of Onset: 03/21/18 HPI: 42 y.o. male with medical history significant for uncontrolled diabetes mellitus, now presenting to the emergency department with 2 to 3 days of abdominal discomfort, malaise, and nausea with nonbloody vomiting.  Patient reports he been in his usual state of health until 2 to 3 days ago when he noted the insidious development of a general malaise and abdominal discomfort with nausea.  He is gone on to experience worsening in these symptoms and has had recurrent nonbloody vomiting.  He denies fevers, chills, chest pain, headache, change in vision or hearing, or focal numbness or weakness.  He reports that he is generally adherent with his insulin regimen, but not in the last couple days because he was feeling sick.  Type of Study: Bedside Swallow Evaluation Previous Swallow Assessment: (none noted) Diet Prior to this Study: Regular;Thin liquids Temperature Spikes Noted: No Respiratory Status: Room air History of Recent Intubation: No Behavior/Cognition: Alert;Cooperative Oral Cavity Assessment: Within Functional Limits  Oral Care Completed by SLP: No Oral Cavity - Dentition: Adequate natural dentition Vision: Functional for self-feeding Self-Feeding Abilities: Able to feed self Patient Positioning: Upright in bed;Other (comment)(as high as tolerated d/t  nausea) Baseline Vocal Quality: Normal Volitional Cough: Strong Volitional Swallow: Able to elicit    Oral/Motor/Sensory Function Overall Oral Motor/Sensory Function: Within functional limits   Ice Chips Ice chips: Within functional limits Presentation: Spoon   Thin Liquid Thin Liquid: Within functional limits Presentation: Cup    Nectar Thick Nectar Thick Liquid: Not tested   Honey Thick Honey Thick Liquid: Not tested   Puree Puree: Not tested   Solid      Solid: Not tested        Tressie Stalker, M.S., CCC-SLP 03/24/2018,11:19 AM

## 2018-03-24 NOTE — Progress Notes (Signed)
PROGRESS NOTE    [name]  EXB:[id number] DOB: 1975/12/12 DOA: 03/21/2018 PCP: Patient, No Pcp Per   Brief Narrative: [name] is a 42 y.o. male with medical history significant for uncontrolled diabetes mellitus and other comorbidities, now presenting to the emergency department with 2 to 3 days of abdominal discomfort, malaise, and nausea with nonbloody vomiting.  Patient reports he been in his usual state of health until 2 to 3 days ago when he noted the insidious development of a general malaise and abdominal discomfort with nausea.  He started experiencing worsening in these symptoms and has had recurrent nonbloody vomiting.   He reported to the admitting physician that he is generally adherent with his insulin regimen, but not in the last couple days because he was feeling sick. He is unable to state how much insulin he takes or how often, reporting that he has it written down at home.    Upon arrival to the ED, Urinalysis is notable for glucosuria and ketonuria. Chemistry panel features a slight hyponatremia, bicarbonate of 12, anion gap 23, glucose 459, and creatinine of 1.80, up from 1.02 years ago. CBC featured a leukocytosis to 25,900 and a hemoglobin of 12.9.  Initial lactic acid is elevated to 3.46 and troponin is normal.  Patient was treated with 2 L normal saline and started on insulin infusion in the ED. Tachycardia has improved somewhat with the IV fluids, blood pressure remained stable, and he was admitted to the stepdown unit for ongoing evaluation and management of DKA.  Is now resolved and patient transferred to the telemetry however he is not eating very well so we will continue to monitor.  He transition to Lantus yesterday after he dropped with Novolin 70/30.  Assessment & Plan:   Principal Problem:   DKA (diabetic ketoacidoses) (HCC) Active Problems:   AKI (acute kidney injury) (HCC)   Leukocytosis   Normocytic anemia   Increased anion gap metabolic  acidosis   Hyperbilirubinemia   DKA; insulin-dependent DM; DKA improved  -Presented with abdominal discomfort, malaise, and N/V  -Found to be in DKA, was fluid-resuscitated with 2 liters NS and started on insulin infusion  -Most likely related to poor adherence to his insulin regimen given A1c values in 11-15 range and his inability to say how much insulin he uses or how often; there is no evidence for infection, CVA, or MI  -Repeat HbA1c this Visit was 11.7 -C/w Supportive Care with IV Zofran 4 mg q6hprn for Nausea/Vomiting -Will keep Patient NPO at this time and place on Carb Modified Diet when ready to transition to Long Acting Insulin  -Continued IV Fluid resuscitation with normal saline rate 125 mL/hr and position to D5 half-normal saline at a rate of 75 mL's per hour once patient's blood sugars are less than 250 -Now back on NS at a rate of 75 mL/hr and will continue  -Transitioned to Long Acting Insulin once Gap is closed and CO2 >20 x2 occasions on serial BMP's -CBG's now ranging from 90-179 and patient remaining Hypoglycemic (Not Eating properly still today ) -Consulted Diabetes education coordinator for further assistance in management of this patient's Diabetes  -Diabetes Education coordinator statedtransitioning to subcu long-acting give the 70/30 15 units twice daily 2 hours prior to discontinuation of Insulin drip however after discussion the the Diabetes Education Coordinator will change to Lantus . -Patient had a Low of 46 yesterday but is not eating much because of painful swallowing -Reduced 70/30 to 10 units BID  but then changed to Lantus 10 units sq qHS given that he is not eating properly and continue Sensitive Novolog SSI q4h and will continue   Acute Kidney Injury, improved  - SCr is 1.80 on admission (BUN was 34), up from 1.0 previously  - Likely an acute prerenal azotemia in setting of DKA   - He was fluid-resuscitated in ED with 2 liters NS and will be continued on  IVF hydration  - BUN/Cr now is 17/1.01 -C/w IVF Resuscitation NS at a rate of 75 mL/hr now that Blood Sugars are improving for now  -Continue Following serial chem panels as above and repeat CMP in AM    Leukocytosis, improved - No fever, and no evidence for infection on UA or CXR  -Suspect this is reactive to DKA; there is also likely hemoconcentration from Dehydration -WBC went from 25.9 -> 26.9 -> 16.4 -> 9.4 - Culture if febrile, repeat CBC in am    Normocytic/Miorocytic Anemia -Patient's Hb/Hct went from 12.9/41.7 and is now 10.9/34.7 -Checked Anemia Panel and showed an iron level of 165, U IBC level 123, TIBC of 288, saturation ratio 57%, ferritin level 164, folate level of 18.0 and vitamin B12 level of 1792 -Likely Dilutional Drop -Continue to Monitor for S/Sx of Bleeding -Repeat CBC in AM  Anion Gap Metabolic Acidosis, improved -In the Setting of DKA -Patient's CO2 this AM was 26 and AG improved to 5 (was 23 on Admission) -Continue to Monitor and Repeat CMP in AM   Hyperbilirubinemia -Patient's T Bili was 1.4 and is now 1.3 -Continue to Monitor and Repeat CMP in AM   Hypophosphatemia -Patient's phosphorus level this morning is 2.3 -Replete with p.o. K-Phos Neutral 500 mg x 1 again -Continue to monitor and replete as necessary -Repeat phosphorus level in a.m.   Chest Pain, improved  -In the setting of hypoglycemia -Troponin x2 is less than 0.03 -EKG showed a Sinus Rhythm at a rate of 95 with no evidence of ST Elevation or Depression on my interpretation -Continue with Nitroglycerin 0.4 mg sublingual every 5 minutes as needed chest pain -Patient was also given IV morphine 2 mg once yesterday -Continue to Monitor closely   Odynophagia/Painful Swallowing -States his throat hurts ever since vomiting that much -Get SLP to evaluate and Treat and they will follow up -Will try Cepacol Lozenges but did not take so will try Chloraseptic Spray   DVT prophylaxis: Heparin  5,000 units sq q8h Code Status: FULL CODE Family Communication: No family present at bedside  Disposition Plan: Anticipate D/C Home in next 24-48 hours once patient is eating properly.   Consultants:   None  Procedures:   None   Antimicrobials:  Anti-infectives (From admission, onward)   None     Subjective: Seen and examined at bedside he was laying in bed slightly withdrawn.  States that his throat was still hurting him and still not eating very well.  No chest pain, lightheadedness or dizziness.  Had a bowel movement this morning.  Denies any other complaints or concerns at this time  Objective: Vitals:   03/23/18 0757 03/23/18 1625 03/23/18 2005 03/24/18 0417  BP:   (!) 143/86 111/63  Pulse:   95 93  Resp:   10 15  Temp: 98.7 F (37.1 C) 99.5 F (37.5 C) 98.6 F (37 C) 98.3 F (36.8 C)  TempSrc: Oral Axillary Oral Oral  SpO2:   97% 100%  Weight:      Height:  Intake/Output Summary (Last 24 hours) at 03/24/2018 1212 Last data filed at 03/24/2018 0309 Gross per 24 hour  Intake 1762.29 ml  Output 725 ml  Net 1037.29 ml   Filed Weights   03/21/18 2039 03/22/18 0445  Weight: 63.5 kg (140 lb) 55.1 kg (121 lb 7.6 oz)   Examination: Physical Exam:  Constitutional: Thin African-American male currently no acute distress appears calm but still complaining of his throat hurting Eyes: Sclera anicteric.  Lids and conjunctive are normal. ENMT: External ears and nose appear normal. Neck: Supple no JVD Respiratory: Initial auscultation bilaterally with no appreciable wheezing, rales, rhonchi.  Patient has unlabored breathing is not using accessory muscles to breathe Cardiovascular: Regular rate and rhythm.  With no appreciable murmurs, rubs, gallops.  No lower extremity edema noted Abdomen: Soft, nontender, nondistended.  Bowel sounds present all 4 quadrants GU: Deferred Musculoskeletal: No clubbing or cyanosis.  No joint deformities noted Skin: Skin is warm and dry  with no appreciable rashes lesions on limited skin evaluation Neurologic: Cranial nerves II through XII grossly intact no appreciable focal deficits Psychiatric: Has a normal mood and affect with intact judgment and insight.  Patient awake and alert and oriented x3.  Still slightly withdrawn  Data Reviewed: I have personally reviewed following labs and imaging studies  CBC: Recent Labs  Lab 03/21/18 2035 03/22/18 0313 03/23/18 0506 03/24/18 0649  WBC 25.9* 26.9* 16.4* 9.4  NEUTROABS  --  24.3* 13.5* 6.4  HGB 12.9* 12.8* 12.3* 10.9*  HCT 41.7 40.9 38.2* 34.7*  MCV 78.4 78.4 76.2* 77.6*  PLT 256 232 219 199   Basic Metabolic Panel: Recent Labs  Lab 03/22/18 0728 03/22/18 1129 03/22/18 2044 03/23/18 0506 03/24/18 0649  NA 140 139 137 140 138  K 4.6 4.6 3.7 3.9 3.5  CL 110 108 108 109 107  CO2 19* 19* 20* 22 26  GLUCOSE 111* 197* 210* 77 150*  BUN 32* 30* 30* 26* 17  CREATININE 1.39* 1.44* 1.42* 1.27* 1.01  CALCIUM 9.4 9.3 9.0 8.7* 8.3*  MG  --   --   --  2.1 2.1  PHOS  --   --   --  2.4* 2.3*   GFR: Estimated Creatinine Clearance: 75 mL/min (by C-G formula based on SCr of 1.01 mg/dL). Liver Function Tests: Recent Labs  Lab 03/21/18 2035 03/23/18 0506 03/24/18 0649  AST 27 26 20   ALT 33 22 18  ALKPHOS 101 77 67  BILITOT 1.4* 1.3* 1.3*  PROT 7.8 6.4* 5.8*  ALBUMIN 4.2 3.5 3.0*   Recent Labs  Lab 03/21/18 2035  LIPASE 17   No results for input(s): AMMONIA in the last 168 hours. Coagulation Profile: No results for input(s): INR, PROTIME in the last 168 hours. Cardiac Enzymes: Recent Labs  Lab 03/23/18 0506 03/23/18 1016 03/23/18 1627  TROPONINI <0.03 <0.03 <0.03   BNP (last 3 results) No results for input(s): PROBNP in the last 8760 hours. HbA1C: Recent Labs    03/23/18 0506  HGBA1C 11.7*   CBG: Recent Labs  Lab 03/23/18 2010 03/23/18 2140 03/23/18 2336 03/24/18 0413 03/24/18 0807  GLUCAP 90 160* 179* 143* 177*   Lipid Profile: No  results for input(s): CHOL, HDL, LDLCALC, TRIG, CHOLHDL, LDLDIRECT in the last 72 hours. Thyroid Function Tests: No results for input(s): TSH, T4TOTAL, FREET4, T3FREE, THYROIDAB in the last 72 hours. Anemia Panel: Recent Labs    03/23/18 0506  VITAMINB12 1,792*  FOLATE 18.0  FERRITIN 164  TIBC 288  IRON 165  RETICCTPCT 0.8   Sepsis Labs: Recent Labs  Lab 03/21/18 2106 03/21/18 2327 03/22/18 0313 03/22/18 0400  LATICACIDVEN 3.46* 2.12* 1.5 1.6    Recent Results (from the past 240 hour(s))  Urine culture     Status: Abnormal   Collection Time: 03/21/18  8:49 PM  Result Value Ref Range Status   Specimen Description URINE, CLEAN CATCH  Final   Special Requests   Final    NONE Performed at [facility], [address] [place], [place] [address]    Culture MULTIPLE SPECIES PRESENT, SUGGEST RECOLLECTION (A)  Final   Report Status 03/23/2018 FINAL  Final  MRSA PCR Screening     Status: None   Collection Time: 03/22/18  4:57 AM  Result Value Ref Range Status   MRSA by PCR NEGATIVE NEGATIVE Final    Comment:        The GeneXpert MRSA Assay (FDA approved for NASAL specimens only), is one component of a comprehensive MRSA colonization surveillance program. It is not intended to diagnose MRSA infection nor to guide or monitor treatment for MRSA infections. Performed at [facility], [address] [place], [place] [address]     Radiology Studies: No results found. Scheduled Meds: . heparin  5,000 Units Subcutaneous Q8H  . insulin aspart  0-9 Units Subcutaneous Q4H  . insulin glargine  10 Units Subcutaneous QHS  . mouth rinse  15 mL Mouth Rinse BID   Continuous Infusions: . sodium chloride 75 mL/hr at 03/24/18 1049    LOS: 3 days   Merlene Laughtermair Latif Roseland Braun, DO [facility] Pager [phone]  If 7PM-7AM, please contact night-coverage [email or web address] Password [email or web address] 03/24/2018, 12:12 PM

## 2018-03-25 DIAGNOSIS — E872 Acidosis: Secondary | ICD-10-CM

## 2018-03-25 DIAGNOSIS — D72829 Elevated white blood cell count, unspecified: Secondary | ICD-10-CM

## 2018-03-25 DIAGNOSIS — E1110 Type 2 diabetes mellitus with ketoacidosis without coma: Principal | ICD-10-CM

## 2018-03-25 DIAGNOSIS — D649 Anemia, unspecified: Secondary | ICD-10-CM

## 2018-03-25 DIAGNOSIS — N179 Acute kidney failure, unspecified: Secondary | ICD-10-CM

## 2018-03-25 LAB — COMPREHENSIVE METABOLIC PANEL
ALT: 18 U/L (ref 0–44)
ANION GAP: 11 (ref 5–15)
AST: 17 U/L (ref 15–41)
Albumin: 3 g/dL — ABNORMAL LOW (ref 3.5–5.0)
Alkaline Phosphatase: 71 U/L (ref 38–126)
BILIRUBIN TOTAL: 1.5 mg/dL — AB (ref 0.3–1.2)
BUN: 13 mg/dL (ref 6–20)
CHLORIDE: 103 mmol/L (ref 98–111)
CO2: 24 mmol/L (ref 22–32)
Calcium: 8.3 mg/dL — ABNORMAL LOW (ref 8.9–10.3)
Creatinine, Ser: 1.06 mg/dL (ref 0.61–1.24)
GFR calc Af Amer: >60 mL/min (ref >60)
GFR calc non Af Amer: >60 mL/min (ref >60)
GLUCOSE: 226 mg/dL — AB (ref 70–99)
POTASSIUM: 3.5 mmol/L (ref 3.5–5.1)
Sodium: 138 mmol/L (ref 135–145)
TOTAL PROTEIN: 5.6 g/dL — AB (ref 6.5–8.1)

## 2018-03-25 LAB — CBC WITH DIFFERENTIAL/PLATELET
Abs Immature Granulocytes: 0 10*3/uL (ref 0.0–0.1)
BASOS PCT: 0 %
Basophils Absolute: 0 10*3/uL (ref 0.0–0.1)
EOS ABS: 0 10*3/uL (ref 0.0–0.7)
Eosinophils Relative: 0 %
HEMATOCRIT: 33.6 % — AB (ref 39.0–52.0)
Hemoglobin: 10.4 g/dL — ABNORMAL LOW (ref 13.0–17.0)
IMMATURE GRANULOCYTES: 0 %
LYMPHS ABS: 2.3 10*3/uL (ref 0.7–4.0)
Lymphocytes Relative: 24 %
MCH: 24.4 pg — ABNORMAL LOW (ref 26.0–34.0)
MCHC: 31 g/dL (ref 30.0–36.0)
MCV: 78.9 fL (ref 78.0–100.0)
Monocytes Absolute: 0.8 10*3/uL (ref 0.1–1.0)
Monocytes Relative: 8 %
NEUTROS ABS: 6.3 10*3/uL (ref 1.7–7.7)
NEUTROS PCT: 68 %
Platelets: 192 10*3/uL (ref 150–400)
RBC: 4.26 MIL/uL (ref 4.22–5.81)
RDW: 13.9 % (ref 11.5–15.5)
WBC: 9.5 10*3/uL (ref 4.0–10.5)

## 2018-03-25 LAB — GLUCOSE, CAPILLARY
GLUCOSE-CAPILLARY: 220 mg/dL — AB (ref 70–99)
GLUCOSE-CAPILLARY: 166 mg/dL — AB (ref 70–99)
GLUCOSE-CAPILLARY: 220 mg/dL — AB (ref 70–99)

## 2018-03-25 LAB — MAGNESIUM: Magnesium: 1.9 mg/dL (ref 1.7–2.4)

## 2018-03-25 LAB — PHOSPHORUS: Phosphorus: 2.7 mg/dL (ref 2.5–4.6)

## 2018-03-25 MED ORDER — INSULIN ASPART PROT & ASPART (70-30 MIX) 100 UNIT/ML ~~LOC~~ SUSP: 10.0000 [IU] | Freq: Two times a day (BID) | SUBCUTANEOUS | 5 refills | Status: DC

## 2018-03-25 MED ORDER — PANTOPRAZOLE SODIUM 40 MG PO TBEC
40.0000 mg | DELAYED_RELEASE_TABLET | Freq: Every day | ORAL | Status: DC
  Administered 2018-03-25: 40 mg via ORAL
  Filled 2018-03-25: qty 1

## 2018-03-25 MED ORDER — MENTHOL 3 MG MT LOZG: 1.0000 | LOZENGE | OROMUCOSAL | 12 refills | Status: DC | PRN

## 2018-03-25 MED ORDER — PHENOL 1.4 % MT LIQD: 1.0000 | OROMUCOSAL | 0 refills | Status: DC | PRN

## 2018-03-25 MED ORDER — PANTOPRAZOLE SODIUM 40 MG PO TBEC: 40.0000 mg | DELAYED_RELEASE_TABLET | Freq: Every day | ORAL | 0 refills | Status: DC

## 2018-03-25 NOTE — Plan of Care (Signed)
Patient's progress has been adequate for discharge.

## 2018-03-25 NOTE — Progress Notes (Signed)
Nsg Discharge Note  Admit Date:  03/21/2018 Discharge date: 03/25/2018   Jacey A Vasseur to be D/C'd Home per MD order.  AVS completed.  Copy for chart, and copy for patient signed, and dated. Patient/caregiver able to verbalize understanding.  Discharge Medication: Allergies as of 03/25/2018   No Known Allergies     Medication List    TAKE these medications   insulin aspart protamine- aspart (70-30) 100 UNIT/ML injection Commonly known as:  NOVOLOG MIX 70/30 Inject 0.1 mLs (10 Units total) into the skin 2 (two) times daily with a meal. Pt to start at 17units bid x 1-2 wks, than increase by 1 unit bid weekly until reach goal of 25units bid; goal accuchks 80-130. What changed:  how much to take   menthol-cetylpyridinium 3 MG lozenge Commonly known as:  CEPACOL Take 1 lozenge (3 mg total) by mouth as needed for sore throat.   pantoprazole 40 MG tablet Commonly known as:  PROTONIX Take 1 tablet (40 mg total) by mouth daily. Start taking on:  03/26/2018   phenol 1.4 % Liqd Commonly known as:  CHLORASEPTIC Use as directed 1 spray in the mouth or throat as needed for throat irritation / pain.       Discharge Assessment: Vitals:   03/25/18 0453 03/25/18 1346  BP: (!) 141/78 (!) 142/77  Pulse: 83 79  Resp: 18 16  Temp: 98.8 F (37.1 C) 99.3 F (37.4 C)  SpO2: 99% 100%   Skin clean, dry and intact without evidence of skin break down, no evidence of skin tears noted. IV catheter discontinued intact. Site without signs and symptoms of complications - no redness or edema noted at insertion site, patient denies c/o pain - only slight tenderness at site.  Dressing with slight pressure applied.  D/c Instructions-Education: Discharge instructions given to patient with verbalized understanding. D/c education completed with patient including follow up instructions, medication list, d/c activities limitations if indicated, with other d/c instructions as indicated by MD - patient able to  verbalize understanding, all questions fully answered. Patient instructed to return to ED, call 911, or call MD for any changes in condition.  Patient escorted via WC, and D/C home via private auto.  Eddie DibblesLauren M Raniah Karan, RN 03/25/2018 4:27 PM

## 2018-03-26 LAB — GLUCOSE, CAPILLARY

## 2018-03-27 NOTE — Discharge Summary (Signed)
Physician Discharge Summary  [name] DOA: 03/21/2018  PCP: Patient, No Pcp Per  Admit date: 03/21/2018 Discharge date: 03/27/2018  Admitted From: Home Disposition: Home  Recommendations for Outpatient Follow-up:  1. Follow up with PCP in 1-2 weeks 2. Have PCP refer to Endocrinologist 3. Follow up with Nutritionist  4. Please obtain CMP/CBC, Mag, Phos in one week 5. Please follow up on the following pending results:  Home Health: No Equipment/Devices: None  Discharge Condition: Stable CODE STATUS: FULL CODE Diet recommendation: Carb Modified Diet   Brief/Interim Summary: Arthur [name] a 42 y.o.malewith medical history significant foruncontrolled diabetes mellitus and other comorbidities, now presenting to the emergency department with 2 to 3 days of abdominal discomfort, malaise, and nausea with nonbloody vomiting. Patient reports he been in his usual state of health until 2 to 3 days ago when he noted the insidious development of a general malaise and abdominal discomfort with nausea. He started experiencing worsening inthesesymptoms and has had recurrent nonbloody vomiting. He reported to the admitting physician that he is generally adherent with his insulin regimen, but not in the last couple days because he was feeling sick. He is unable to state how much insulin he takes or how often,reporting that he has it written down at home.  Upon arrival to the ED, Urinalysis is notable for glucosuria and ketonuria. Chemistry panel features a slight hyponatremia, bicarbonate of 12, anion gap 23, glucose 459, and creatinine of 1.80, up from 1.02 years ago. CBC featured a leukocytosis to 25,900 and a hemoglobin of 12.9. Initial lactic acid is elevated to 3.46 and troponin is normal. Patient was treated with 2 L normal saline and started on insulin infusion in the ED. Tachycardia has improved somewhat with the IV fluids, blood pressure remained  stable, and he was admitted to the stepdown unit for ongoing evaluation and management of DKA.  Is now resolved and patient transferred to the telemetry however he is not eating very well so we will continue to monitor.  He transition to Lantus yesterday after he dropped his BS with Novolin 70/30. He proven 8 some and was deemed medically stable to be discharged and will be discharged back on his home Novolin 70/30 but he will be decreased to 10 units twice daily.  He will need to follow-up with primary care physician and have primary care physician referred to endocrinology in the outpatient setting.  Discharge Diagnoses:  Principal Problem:   DKA (diabetic ketoacidoses) (HCC) Active Problems:   AKI (acute kidney injury) (HCC)   Leukocytosis   Normocytic anemia   Increased anion gap metabolic acidosis   Hyperbilirubinemia  DKA; insulin-dependent DM; DKA improved  -Presented with abdominal discomfort, malaise, and N/V -Found to be in DKA, was fluid-resuscitated with 2 liters NS and started on insulin infusion -Most likely related to poor adherence to his insulin regimen given A1c values in 11-15 range and his inability to say how much insulin he uses or how often; there is no evidence for infection, CVA, or MI -Repeat HbA1c this Visit was 11.7 -C/w Supportive Care with IV Zofran 4 mg q6hprn for Nausea/Vomiting -Will keep Patient NPO at this time and place on Carb Modified Diet when ready to transition to Long Acting Insulin  -Continued IV Fluid resuscitation with normal saline rate 125 mL/hr and position to D5 half-normal saline at a rate of 75 mL's per hour once patient's blood sugars are less than 250 -Now back on NS at a rate  of 75 mL/hr and will continue  -Transitioned to Long Acting Insulin once Gap is closed and CO2 >20 x2 occasions on serial BMP's -CBG's now ranging from 100-220 -Consulted Diabetes education coordinator for further assistance in management of this patient's Diabetes   -Diabetes Education coordinator statedtransitioning to subcu long-acting give the 70/30 15 units twice daily 2 hours prior to discontinuation of Insulin drip however after discussion the the Diabetes Education Coordinator will change to Lantus . -Patient had a Low of 46 the day before but is not eating much because of painful swallowing -Reduced 70/30 to 10 units BID but then changed to Lantus 10 units sq qHS given that he is not eating properly and continue Sensitive Novolog SSI q4h -He ate better and improved  -CBG's better controlled so will transition back to Home Novolin 70/30 and place on 10 units BID -Deemed stable to D/C need to follow-up with primary care physician and endocrinologist in outpatient setting for continued diabetes management  Acute Kidney Injury, improved -SCr is 1.80 on admission (BUN was 34), up from 1.0 previously -Likely an acute prerenal azotemia in setting of DKA -He was fluid-resuscitated in ED with 2 liters NS and will be continued on IVF hydration - BUN/Cr now is 13/1.006 -C/w IVF Resuscitation NS at a rate of 75 mL/hr now that Blood Sugars are improving for now while hospitalized   -ContinueFollowing and repeat CMp as an outpatient    Leukocytosis, improved -No fever, and no evidence for infection on UA or CXR -Suspect this is reactive to DKA; there is also likely hemoconcentrationfrom Dehydration -WBC went from 25.9 -> 26.9 -> 16.4 -> 9.4 -> 9.5 -Culture if febrile, repeat CBC as an outpatient   Normocytic/Miorocytic Anemia -Patient's Hb/Hct went from 12.9/41.7 and is now 10.4/33.6 -Checked Anemia Panel and showed an iron level of 165, U IBC level 123, TIBC of 288, saturation ratio 57%, ferritin level 164, folate level of 18.0 and vitamin B12 level of 1792 -Likely Dilutional Drop -Continue to Monitor for S/Sx of Bleeding -Repeat CBC as an outpatient   Anion Gap Metabolic Acidosis, improved -In the Setting of DKA -Patient's CO2  this AM was 24 and AG improved to 11 (was 23 on Admission) -Continue to Monitor and Repeat CMP as an outpatient   Hyperbilirubinemia -Patient's T Bili was 1.4 and is now 1.5 -Continue to Monitor and Repeat CMP as an outpatient   Hypophosphatemia -Patient's phosphorus level this morning is 2.7 -Continue to monitor and replete as necessary -Repeat phosphorus level as an outpatient    Chest Pain, improved  -In the setting of hypoglycemia -Troponin x2 is less than 0.03 -EKG showed a Sinus Rhythm at a rate of 95 with no evidence of ST Elevation or Depression on my interpretation -Continue with Nitroglycerin 0.4 mg sublingual every 5 minutes as needed chest pain -Patient was also given IV morphine 2 mg once during hospitalization -Continue to Monitor closely as an outpatient with PCP   Odynophagia/Painful Swallowing -States his throat hurts ever since vomiting that much -Get SLP to evaluate and Treat and they will follow up -Tried Cepacol Lozenges and Chloraseptic Spray with relief -Follow up with PCP    Discharge Instructions  Discharge Instructions    Call MD for:  difficulty breathing, headache or visual disturbances   Complete by:  As directed    Call MD for:  extreme fatigue   Complete by:  As directed    Call MD for:  hives   Complete by:  As  directed    Call MD for:  persistant dizziness or light-headedness   Complete by:  As directed    Call MD for:  persistant nausea and vomiting   Complete by:  As directed    Call MD for:  redness, tenderness, or signs of infection (pain, swelling, redness, odor or green/yellow discharge around incision site)   Complete by:  As directed    Call MD for:  severe uncontrolled pain   Complete by:  As directed    Call MD for:  temperature >100.4   Complete by:  As directed    Diet - low sodium heart healthy   Complete by:  As directed    Diet Carb Modified   Complete by:  As directed    Discharge instructions   Complete by:  As  directed    Follow up with PCP within 1 week of Discharge. Take all medications as prescribed. If symptoms change or worsen please return to the ED for evaluation.   Increase activity slowly   Complete by:  As directed      Allergies as of 03/25/2018   No Known Allergies     Medication List    TAKE these medications   insulin aspart protamine- aspart (70-30) 100 UNIT/ML injection Commonly known as:  NOVOLOG MIX 70/30 Inject 0.1 mLs (10 Units total) into the skin 2 (two) times daily with a meal. Pt to start at 17units bid x 1-2 wks, than increase by 1 unit bid weekly until reach goal of 25units bid; goal accuchks 80-130. What changed:  how much to take   menthol-cetylpyridinium 3 MG lozenge Commonly known as:  CEPACOL Take 1 lozenge (3 mg total) by mouth as needed for sore throat.   pantoprazole 40 MG tablet Commonly known as:  PROTONIX Take 1 tablet (40 mg total) by mouth daily.   phenol 1.4 % Liqd Commonly known as:  CHLORASEPTIC Use as directed 1 spray in the mouth or throat as needed for throat irritation / pain.      Follow-up Information    [facility] Follow up.   Why:  Please call and schedule hospital follow appointment Contact information: [address] [place] [address] [phone]         No Known Allergies  Consultations:  None  Procedures/Studies: Dg Chest 2 View  Result Date: 03/21/2018 CLINICAL DATA:  Tachycardia EXAM: CHEST - 2 VIEW COMPARISON:  12/16/2015 FINDINGS: Hyperinflation. No acute airspace disease or pleural effusion. Normal heart size. No pneumothorax. IMPRESSION: No active cardiopulmonary disease.  Hyperinflation Electronically Signed   By: Jasmine Pang M.D.   On: 03/21/2018 22:33    Subjective: And examined at bedside and was eating slightly better.  No chest pain, shortness breath, nausea, vomiting.  Felt well and was deemed stable to go home and he is happy.  No other concerns  or complaints at this time  Discharge Exam: Vitals:   03/25/18 0453 03/25/18 1346  BP: (!) 141/78 (!) 142/77  Pulse: 83 79  Resp: 18 16  Temp: 98.8 F (37.1 C) 99.3 F (37.4 C)  SpO2: 99% 100%   Vitals:   03/24/18 1429 03/24/18 2331 03/25/18 0453 03/25/18 1346  BP: (!) 141/86 (!) 148/92 (!) 141/78 (!) 142/77  Pulse: 93 85 83 79  Resp: 18 18 18 16   Temp: 98.4 F (36.9 C) 99.6 F (37.6 C) 98.8 F (37.1 C) 99.3 F (37.4 C)  TempSrc: Oral   Oral  SpO2: 100% 100% 99% 100%  Weight:      Height:       General: Pt is a thin AAM who is alert, awake, not in acute distress Cardiovascular: RRR, S1/S2 +, no rubs, no gallops Respiratory: CTA bilaterally, no wheezing, no rhonchi Abdominal: Soft, NT, ND, bowel sounds + Extremities: no edema, no cyanosis  The results of significant diagnostics from this hospitalization (including imaging, microbiology, ancillary and laboratory) are listed below for reference.    Microbiology: Recent Results (from the past 240 hour(s))  Urine culture     Status: Abnormal   Collection Time: 03/21/18  8:49 PM  Result Value Ref Range Status   Specimen Description URINE, CLEAN CATCH  Final   Special Requests   Final    NONE Performed at [facility], [address] [place], [place] [address]    Culture MULTIPLE SPECIES PRESENT, SUGGEST RECOLLECTION (A)  Final   Report Status 03/23/2018 FINAL  Final  MRSA PCR Screening     Status: None   Collection Time: 03/22/18  4:57 AM  Result Value Ref Range Status   MRSA by PCR NEGATIVE NEGATIVE Final    Comment:        The GeneXpert MRSA Assay (FDA approved for NASAL specimens only), is one component of a comprehensive MRSA colonization surveillance program. It is not intended to diagnose MRSA infection nor to guide or monitor treatment for MRSA infections. Performed at [facility], [address], [place], [place] [address]     Labs: BNP (last 3 results) No results for input(s): BNP in  the last 8760 hours. Basic Metabolic Panel: Recent Labs  Lab 03/22/18 1129 03/22/18 2044 03/23/18 0506 03/24/18 0649 03/25/18 0438  NA 139 137 140 138 138  K 4.6 3.7 3.9 3.5 3.5  CL 108 108 109 107 103  CO2 19* 20* 22 26 24   GLUCOSE 197* 210* 77 150* 226*  BUN 30* 30* 26* 17 13  CREATININE 1.44* 1.42* 1.27* 1.01 1.06  CALCIUM 9.3 9.0 8.7* 8.3* 8.3*  MG  --   --  2.1 2.1 1.9  PHOS  --   --  2.4* 2.3* 2.7   Liver Function Tests: Recent Labs  Lab 03/21/18 2035 03/23/18 0506 03/24/18 0649 03/25/18 0438  AST 27 26 20 17   ALT 33 22 18 18   ALKPHOS 101 77 67 71  BILITOT 1.4* 1.3* 1.3* 1.5*  PROT 7.8 6.4* 5.8* 5.6*  ALBUMIN 4.2 3.5 3.0* 3.0*   Recent Labs  Lab 03/21/18 2035  LIPASE 17   No results for input(s): AMMONIA in the last 168 hours. CBC: Recent Labs  Lab 03/21/18 2035 03/22/18 0313 03/23/18 0506 03/24/18 0649 03/25/18 0438  WBC 25.9* 26.9* 16.4* 9.4 9.5  NEUTROABS  --  24.3* 13.5* 6.4 6.3  HGB 12.9* 12.8* 12.3* 10.9* 10.4*  HCT 41.7 40.9 38.2* 34.7* 33.6*  MCV 78.4 78.4 76.2* 77.6* 78.9  PLT 256 232 219 199 192   Cardiac Enzymes: Recent Labs  Lab 03/23/18 0506 03/23/18 1016 03/23/18 1627  TROPONINI <0.03 <0.03 <0.03   BNP: Invalid input(s): POCBNP CBG: Recent Labs  Lab 03/24/18 2009 03/24/18 2333 03/25/18 0430 03/25/18 0719 03/25/18 1029  GLUCAP 100* 174* 220* 166* 220*   D-Dimer No results for input(s): DDIMER in the last 72 hours. Hgb A1c No results for input(s): HGBA1C in the last 72 hours. Lipid Profile No results for input(s): CHOL, HDL, LDLCALC, TRIG, CHOLHDL, LDLDIRECT in the last 72 hours. Thyroid  function studies No results for input(s): TSH, T4TOTAL, T3FREE, THYROIDAB in the last 72 hours.  Invalid input(s): FREET3 Anemia work up No results for input(s): VITAMINB12, FOLATE, FERRITIN, TIBC, IRON, RETICCTPCT in the last 72 hours. Urinalysis    Component Value Date/Time   COLORURINE STRAW (A) 03/21/2018 2049    APPEARANCEUR CLEAR 03/21/2018 2049   LABSPEC 1.025 03/21/2018 2049   PHURINE 5.0 03/21/2018 2049   GLUCOSEU >=500 (A) 03/21/2018 2049   HGBUR SMALL (A) 03/21/2018 2049   BILIRUBINUR NEGATIVE 03/21/2018 2049   BILIRUBINUR neg 04/08/2014 1514   KETONESUR 80 (A) 03/21/2018 2049   PROTEINUR NEGATIVE 03/21/2018 2049   UROBILINOGEN 0.2 04/08/2014 1514   UROBILINOGEN 0.2 03/29/2014 1206   NITRITE NEGATIVE 03/21/2018 2049   LEUKOCYTESUR NEGATIVE 03/21/2018 2049   Sepsis Labs Invalid input(s): PROCALCITONIN,  WBC,  LACTICIDVEN Microbiology Recent Results (from the past 240 hour(s))  Urine culture     Status: Abnormal   Collection Time: 03/21/18  8:49 PM  Result Value Ref Range Status   Specimen Description URINE, CLEAN CATCH  Final   Special Requests   Final    NONE Performed at [facility], [address], [place], [place] [address]    Culture MULTIPLE SPECIES PRESENT, SUGGEST RECOLLECTION (A)  Final   Report Status 03/23/2018 FINAL  Final  MRSA PCR Screening     Status: None   Collection Time: 03/22/18  4:57 AM  Result Value Ref Range Status   MRSA by PCR NEGATIVE NEGATIVE Final    Comment:        The GeneXpert MRSA Assay (FDA approved for NASAL specimens only), is one component of a comprehensive MRSA colonization surveillance program. It is not intended to diagnose MRSA infection nor to guide or monitor treatment for MRSA infections. Performed at [facility], [address] [place], [place] [address]    Time coordinating discharge: 35 minutes  SIGNED:  Merlene Laughter, DO [facility] 03/27/2018, 6:12 PM Pager [phone]  If 7PM-7AM, please contact night-coverage [email or web address] Password [email or web address]

## 2018-06-26 ENCOUNTER — Ambulatory Visit: Payer: Self-pay | Admitting: Family Medicine

## 2018-07-12 ENCOUNTER — Ambulatory Visit: Payer: Self-pay | Attending: Family Medicine | Admitting: Family Medicine

## 2018-07-12 ENCOUNTER — Encounter: Payer: Self-pay | Admitting: Family Medicine

## 2018-07-12 ENCOUNTER — Other Ambulatory Visit: Payer: Self-pay

## 2018-07-12 VITALS — BP 132/89 | HR 104 | Temp 98.8°F | Resp 18 | Ht 72.0 in | Wt 136.6 lb

## 2018-07-12 DIAGNOSIS — E1165 Type 2 diabetes mellitus with hyperglycemia: Secondary | ICD-10-CM

## 2018-07-12 DIAGNOSIS — E089 Diabetes mellitus due to underlying condition without complications: Secondary | ICD-10-CM

## 2018-07-12 DIAGNOSIS — E1300 Other specified diabetes mellitus with hyperosmolarity without nonketotic hyperglycemic-hyperosmolar coma (NKHHC): Secondary | ICD-10-CM

## 2018-07-12 DIAGNOSIS — Z8249 Family history of ischemic heart disease and other diseases of the circulatory system: Secondary | ICD-10-CM | POA: Insufficient documentation

## 2018-07-12 DIAGNOSIS — Z87891 Personal history of nicotine dependence: Secondary | ICD-10-CM | POA: Insufficient documentation

## 2018-07-12 DIAGNOSIS — Z9889 Other specified postprocedural states: Secondary | ICD-10-CM | POA: Insufficient documentation

## 2018-07-12 DIAGNOSIS — Z794 Long term (current) use of insulin: Secondary | ICD-10-CM

## 2018-07-12 DIAGNOSIS — Z833 Family history of diabetes mellitus: Secondary | ICD-10-CM | POA: Insufficient documentation

## 2018-07-12 DIAGNOSIS — E119 Type 2 diabetes mellitus without complications: Secondary | ICD-10-CM

## 2018-07-12 LAB — POCT GLYCOSYLATED HEMOGLOBIN (HGB A1C)
HbA1c POC (<> result, manual entry): 12.2 %
HbA1c, POC (controlled diabetic range): 12.2 % — AB (ref 0.0–7.0)
HbA1c, POC (prediabetic range): 12.2 % — AB (ref 5.7–6.4)
Hemoglobin A1C: 12.2 % — AB (ref 4.0–5.6)

## 2018-07-12 LAB — GLUCOSE, POCT (MANUAL RESULT ENTRY): POC Glucose: 318 mg/dL — AB (ref 70–99)

## 2018-07-12 MED ORDER — INSULIN ASPART PROT & ASPART (70-30 MIX) 100 UNIT/ML ~~LOC~~ SUSP: SUBCUTANEOUS | 2 refills | Status: DC

## 2018-07-12 NOTE — Progress Notes (Signed)
Flu shot: no  Pain: 0  

## 2018-07-12 NOTE — Progress Notes (Signed)
Subjective:    Patient ID: [name], male    DOB: 11-17-1975, 42 y.o.   MRN: [id number]  HPI 42 year old male who was last seen in this office on 01/26/2016.  Patient states that he did live in [place] for a while since the last time he was seen here in the office.  Patient has a history of poorly controlled type 2 diabetes requiring insulin.  Patient is status post hospitalization on March 21, 2018 through March 27, 2018 secondary to diabetic ketoacidosis.  Patient states that when he checks his blood sugars at home they run between the 200s to 300s and when patient was asked to clarify, he states that the blood sugars fasting also tend to be in the 200s and sometimes 300s.  Patient however states that when his blood sugars are less than 100 he feels weak and shaky.  Patient states that his lowest blood sugars have been in the 160s to 180s fasting.  Patient is still taking 70/30 insulin at 10 units twice daily as he states that this is what he was prescribed at hospital discharge.  When patient was questioned about hospital discharge orders to titrate or increase his dose of 70-30, patient states that he does sometimes increase his doses when his sugars are high but generally he is still on 10 units twice daily.  Patient occasionally has some brief numbness or pain in his hands and feet but not daily.       Patient reports that he is not allergic to any medications.  Patient reports only prior surgery has been an appendectomy.  Patient states that he smokes 1 to 2 cigarettes/week.  Patient is currently unemployed.  Patient's family history is significant for hypertension.  Patient's mother is with him at today's visit.  Patient and mother state that patient has had 3 out of his 4 siblings pass away from complications related to diabetes.  Patient denies any increased thirst, he denies blurred vision or urinary frequency.  Patient denies any numbness or tingling in his hands or feet. Past Medical History:    Diagnosis Date  . Diabetes mellitus    Newly diagnosed, May 2012   Past Surgical History:  Procedure Laterality Date  . APPENDECTOMY    . TONSILLECTOMY     Family History  Problem Relation Age of Onset  . Hypertension Mother   . Diabetes Sister        Several brothers and sister with DM  . Diabetes Brother    Social History   Tobacco Use  . Smoking status: Former Smoker    Last attempt to quit: 10/20/2015    Years since quitting: 2.7  . Smokeless tobacco: Former Neurosurgeon    Quit date: 10/20/2015  . Tobacco comment: Smoking 1 cigs every 4 days  Substance Use Topics  . Alcohol use: No    Alcohol/week: 0.0 standard drinks  . Drug use: Yes    Types: Marijuana    Comment: last use 2-3 days ago  No Known Allergies    Review of Systems  Constitutional: Positive for fatigue (Occasional). Negative for chills, diaphoresis and fever.  HENT: Negative for sore throat and trouble swallowing.   Respiratory: Negative for cough and shortness of breath.   Cardiovascular: Negative for chest pain, palpitations and leg swelling.  Gastrointestinal: Negative for abdominal pain and nausea.  Endocrine: Negative for polydipsia, polyphagia and polyuria.  Genitourinary: Negative for dysuria and frequency.  Musculoskeletal: Negative for arthralgias, back pain, gait problem  and joint swelling.  Neurological: Negative for dizziness, numbness and headaches.       Objective:   Physical Exam BP 132/89   Pulse (!) 104   Temp 98.8 F (37.1 C) (Oral)   Resp 18   Ht 6' (1.829 m)   Wt 136 lb 9.6 oz (62 kg)   SpO2 100%   BMI 18.53 kg/m Nurse's notes and vital signs reviewed General-well-nourished, well-developed male in no acute distress.  Patient is wearing glasses.  Patient is sitting on exam table and patient is accompanied by his mother who was present for the visit. ENT-TMs gray bilaterally, nares with mild edema of the nasal turbinates, patient with mild posterior pharynx erythema.   Neck-supple, no lymphadenopathy, no thyromegaly, no carotid bruit.   Lungs-clear to auscultation bilaterally.   Cardiovascular-regular rate and rhythm.  Patient reports that he has been told in the past that he has a heart murmur but I did not appreciate this on today's exam. Abdomen- soft and nontender Back-no CVA tenderness Extremities-no edema Diabetic foot exam- no active skin breakdown on the feet.  Patient with some dry skin on the heels.  Patient with bilateral mild hammertoe deformities.  Patient does have left second toe which is slightly overlapping the left great toe.  Patient with 1+ dorsalis pedis and posterior tibial pulses.  Patient with normal monofilament exam on 10 out of 10 areas tested        Assessment & Plan:  1. Diabetes mellitus due to underlying condition without complication, unspecified whether long term insulin use (HCC) Patient is status post hospitalization from 03/21/2018 through 03/27/2018 secondary to diabetic ketoacidosis.  Patient presented to the emergency room with 2 to 3 days of abdominal pain with nausea and vomiting.  Patient states that the symptoms have now resolved.  Patient however continues to take approximately 10 units of 70/30 insulin twice daily but his discharge orders were to titrate the insulin.  At today's visit, patient's hemoglobin A1c is elevated at 12.2.  Discussed titration of his insulin to help better control his blood sugars with eventual goal of fasting blood sugars of 130 or less. - HgB A1c - Glucose (CBG)  2. Poorly controlled type 2 diabetes mellitus (HCC) Patient will have CMP, creatinine microalbumin ratio, lipid panel and urinalysis at today's visit and follow-up of his poorly controlled diabetes.  Patient is encouraged to titrate insulin as discussed and patient is to follow-up with the clinical pharmacist regarding his blood sugar control.  Discussed with the patient that he will also need yearly diabetic eye exam and patient is  also encouraged to have yearly dental visit.  Diabetic foot care was also discussed with the patient at today's visit. - Comprehensive metabolic panel - Microalbumin/Creatinine Ratio, Urine - Lipid panel - POCT URINALYSIS DIP (CLINITEK)  3. Type 2 diabetes mellitus without complication, with long-term current use of insulin (HCC) Patient will have labs in follow-up of his poorly controlled diabetes.  Patient with normal monofilament exam but does have some occasional symptoms suggestive of peripheral neuropathy.  Patient will titrate insulin as discussed and schedule follow-up with the clinical pharmacist - Comprehensive metabolic panel - Microalbumin/Creatinine Ratio, Urine - Lipid panel - POCT URINALYSIS DIP (CLINITEK)  4. DM (diabetes mellitus), secondary, uncontrolled, with hyperosmolarity (HCC) Again discussed the importance of titrating his insulin as discussed upon hospital discharge in order to help control his blood sugars and avoid further long-term complications.  Patient provided with new prescription for his 70/30 NovoLog and patient  is to increase to 12 units nightly and then continue to titrate.  Patient will follow-up with the clinical pharmacist.  Patient will have CMP, microalbumin creatinine ratio and lipid panel at today's visit.  Patient is aware that statin medication is recommended to help reduce the risk of heart disease associated with diabetes.  Patient with a strong family history of diabetes and diabetic complications as patient states that 3 of his 4 siblings have passed away due to complications from diabetes. - insulin aspart protamine- aspart (NOVOLOG MIX 70/30) (70-30) 100 UNIT/ML injection; Pt to start at 15units bid x 1-2 wks, than increase by 1 unit bid weekly until reach goal of 25units bid; goal blood sugar 100-130 fasting  Dispense: 10 mL; Refill: 2  *Patient was offered but declined influenza immunization at today's   Allergies as of 07/12/2018   No Known  Allergies     Medication List        Accurate as of 07/12/18 11:59 PM. Always use your most recent med list.          insulin aspart protamine- aspart (70-30) 100 UNIT/ML injection Commonly known as:  NOVOLOG MIX 70/30 Pt to start at 15units bid x 1-2 wks, than increase by 1 unit bid weekly until reach goal of 25units bid; goal blood sugar 100-130 fasting   menthol-cetylpyridinium 3 MG lozenge Commonly known as:  CEPACOL Take 1 lozenge (3 mg total) by mouth as needed for sore throat.   pantoprazole 40 MG tablet Commonly known as:  PROTONIX Take 1 tablet (40 mg total) by mouth daily.   phenol 1.4 % Liqd Commonly known as:  CHLORASEPTIC Use as directed 1 spray in the mouth or throat as needed for throat irritation / pain.     An After Visit Summary was printed and given to the patient.  Return for DM-2 weeks with [name]; 6 weeks with PCP.

## 2018-07-26 ENCOUNTER — Ambulatory Visit: Payer: Self-pay | Admitting: Pharmacist

## 2018-07-27 ENCOUNTER — Encounter: Payer: Self-pay | Admitting: Pharmacist

## 2018-07-27 ENCOUNTER — Ambulatory Visit: Payer: Self-pay | Attending: Family Medicine | Admitting: Pharmacist

## 2018-07-27 DIAGNOSIS — F17210 Nicotine dependence, cigarettes, uncomplicated: Secondary | ICD-10-CM | POA: Insufficient documentation

## 2018-07-27 DIAGNOSIS — E11649 Type 2 diabetes mellitus with hypoglycemia without coma: Secondary | ICD-10-CM | POA: Insufficient documentation

## 2018-07-27 DIAGNOSIS — Z794 Long term (current) use of insulin: Secondary | ICD-10-CM | POA: Insufficient documentation

## 2018-07-27 DIAGNOSIS — E0800 Diabetes mellitus due to underlying condition with hyperosmolarity without nonketotic hyperglycemic-hyperosmolar coma (NKHHC): Secondary | ICD-10-CM

## 2018-07-27 DIAGNOSIS — Z833 Family history of diabetes mellitus: Secondary | ICD-10-CM | POA: Insufficient documentation

## 2018-07-27 DIAGNOSIS — E089 Diabetes mellitus due to underlying condition without complications: Secondary | ICD-10-CM

## 2018-07-27 LAB — GLUCOSE, POCT (MANUAL RESULT ENTRY): POC Glucose: 327 mg/dL — AB (ref 70–99)

## 2018-07-27 MED ORDER — INSULIN GLARGINE 100 UNIT/ML ~~LOC~~ SOLN: SUBCUTANEOUS | 2 refills | Status: DC

## 2018-07-27 MED FILL — !LANTUS 100 UNITS/ML VIAL: 100 | 28 days supply | Qty: 10 | Fill #0

## 2018-07-27 NOTE — Progress Notes (Signed)
    S:    PCP: Dr. Jillyn Hidden  No chief complaint on file.  Patient arrives in good spirits. Presents for diabetes management at the request of Dr. Jillyn Hidden. Patient was referred on 07/12/18. Patient was instructed to self-titrate Novolog mix 70/30 to achieve fasting blood sugar goals. Today, he reports not taking Novolog 70/30 but Novolin 70/30 that he purchased OTC at [facility] [place] [facility].  Family/Social History:  - FHx: DM (3 siblings) - Tobacco: 1 cigarette q2weeks - Alcohol: denies  Insurance coverage/medication affordability:  - Self-pay  Patient reports adherence with medications.  Current diabetes medications include:  - Novolog 70/30. Reports taking Novolin 70/30 10-15 units BID  Patient reports hypoglycemic events. Gives range in 50s.   Patient reported dietary habits:  - Occasionally drinks sweet tea - Specifics not given  Patient-reported exercise habits:  - Reports occasional    Patient denies polyphagia, polydipsia, or polyuria.  Patient denies neuropathy. Patient denies visual changes.  O:  POCT 327. Drank sweet tea Home glucose levels: 300s  Lab Results  Component Value Date   HGBA1C 12.2 (A) 07/12/2018   HGBA1C 12.2 07/12/2018   HGBA1C 12.2 (A) 07/12/2018   HGBA1C 12.2 (A) 07/12/2018   There were no vitals filed for this visit.  Lipid Panel     Component Value Date/Time   CHOL 188 04/09/2014 1106   TRIG 92 04/09/2014 1106   HDL 47 04/09/2014 1106   CHOLHDL 4.0 04/09/2014 1106   VLDL 18 04/09/2014 1106   LDLCALC 123 (H) 04/09/2014 1106   Clinical ASCVD: No  10 year ASCVD risk: 11%  A/P: Diabetes longstanding currently uncontrolled. Patient is able to verbalize appropriate hypoglycemia management plan. Patient is not adherent with medication. Control is suboptimal due to medication non-adherence, sedentary lifestyle, and dietary indiscretion.  Patient cannot tolerate metformin. His A1c is > 11. He reports history of labile blood sugars. Reports hypoglycemia at  home. Will stop 70/30 and start basal insulin at night. All of his siblings are deceased and had DM. Patient may benefit from Endo referral. I have instructed him to have an appointment made to obtain the orange card so we can make that referral for him if needed.   -Started Lantus (insulin glargine). Patient will continue to titrate 2 units every 3 days until fasting CBGs reach goal or next visit. Max 30 units.  -Stopped Novolog 70/30. -Extensively discussed pathophysiology of DM, recommended lifestyle interventions, dietary effects on glycemic control -Counseled on s/sx of and management of hypoglycemia -Next A1C anticipated 09/2018.   ASCVD risk - primary prevention in patient with DM. Last LDL is not controlled. ASCVD risk score is not >20%  - moderate intensity statin indicated. Aspirin is not indicated. Offered statin. Patient declines at this time.  -Lipid panel   Written patient instructions provided.  Total time in face to face counseling 15 minutes.   Follow up Pharmacist Clinic Visit 08/10/18.    Butch Penny, PharmD, CPP Clinical Pharmacist [facility] [phone]

## 2018-07-27 NOTE — Patient Instructions (Addendum)
Thank you for coming to see me today. Please do the following:  1. Stop 70/30. 2. Start Lantus 10 units at bedtime. Increase by 2 units every 2-3 days if fasting levels are above 130. Do not increase past 30 units. 3. Continue checking blood sugars at home. 4. Continue making the lifestyle changes we've discussed together during our visit. Diet and exercise play a significant role in improving your blood sugars.  5. Follow-up with me in 2 weeks.    Hypoglycemia or low blood sugar:   Low blood sugar can happen quickly and may become an emergency if not treated right away.   While this shouldn't happen often, it can be brought upon if you skip a meal or do not eat enough. Also, if your insulin or other diabetes medications are dosed too high, this can cause your blood sugar to go to low.   Warning signs of low blood sugar include: 1. Feeling shaky or dizzy 2. Feeling weak or tired  3. Excessive hunger 4. Feeling anxious or upset  5. Sweating even when you aren't exercising  What to do if I experience low blood sugar? 1. Check your blood sugar with your meter. If lower than 70, proceed to step 2.  2. Treat with 3-4 glucose tablets or 3 packets of regular sugar. If these aren't around, you can try hard candy. Yet another option would be to drink 4 ounces of fruit juice or 6 ounces of REGULAR soda.  3. Re-check your sugar in 15 minutes. If it is still below 70, do what you did in step 2 again. If has come back up, go ahead and eat a snack or small meal at this time.

## 2018-07-28 LAB — CMP14+EGFR
ALT: 24 IU/L (ref 0–44)
AST: 17 IU/L (ref 0–40)
Albumin/Globulin Ratio: 1.7 (ref 1.2–2.2)
Albumin: 4.5 g/dL (ref 3.5–5.5)
Alkaline Phosphatase: 96 IU/L (ref 39–117)
BUN/Creatinine Ratio: 14 (ref 9–20)
BUN: 16 mg/dL (ref 6–24)
Bilirubin Total: 0.4 mg/dL (ref 0.0–1.2)
CO2: 25 mmol/L (ref 20–29)
Calcium: 9.4 mg/dL (ref 8.7–10.2)
Chloride: 101 mmol/L (ref 96–106)
Creatinine, Ser: 1.16 mg/dL (ref 0.76–1.27)
GFR calc Af Amer: 90 mL/min/1.73
GFR calc non Af Amer: 78 mL/min/1.73
Globulin, Total: 2.7 g/dL (ref 1.5–4.5)
Glucose: 354 mg/dL — ABNORMAL HIGH (ref 65–99)
Potassium: 4.7 mmol/L (ref 3.5–5.2)
Sodium: 141 mmol/L (ref 134–144)
Total Protein: 7.2 g/dL (ref 6.0–8.5)

## 2018-07-28 LAB — LIPID PANEL
Chol/HDL Ratio: 4.1 ratio (ref 0.0–5.0)
Cholesterol, Total: 183 mg/dL (ref 100–199)
HDL: 45 mg/dL
LDL Calculated: 113 mg/dL — ABNORMAL HIGH (ref 0–99)
Triglycerides: 125 mg/dL (ref 0–149)
VLDL Cholesterol Cal: 25 mg/dL (ref 5–40)

## 2018-07-30 ENCOUNTER — Other Ambulatory Visit: Payer: Self-pay | Admitting: Family Medicine

## 2018-07-30 DIAGNOSIS — E785 Hyperlipidemia, unspecified: Secondary | ICD-10-CM

## 2018-07-30 MED ORDER — SIMVASTATIN 20 MG PO TABS: 20.0000 mg | ORAL_TABLET | Freq: Every day | ORAL | 3 refills | Status: DC

## 2018-07-30 NOTE — Progress Notes (Signed)
Patient ID: [name], male   DOB: 05/17/1976, 42 y.o.   MRN: [id number]   Patient with recent labs showing uncontrolled diabetes with blood sugar of 345.  Patient also had lipid panel with LDL of 113.  Patient will be placed on low-dose statin, simvastatin 20 mg to help reduce the risk of CAD associated with diabetes and to help lower LDL to goal of 70 or less along with a healthy diet low in fat.

## 2018-08-01 ENCOUNTER — Telehealth: Payer: Self-pay | Admitting: *Deleted

## 2018-08-01 NOTE — Telephone Encounter (Signed)
Notes recorded by Cain SaupeFulp, Cammie, MD on 07/30/2018 at 6:15 PM EST Left message on voicemail to return call.   Notify patient that his blood sugar was 354 and his recent blood work. CMP was otherwise normal. Please find out if patient has checked his blood sugar the day and if so what is the value. Please make sure or asked patient if he has obtained his medications for treatment of diabetes. Patient with a mild increase in bad cholesterol at 113 but because he is diabetic, his goal LDL is 70 or less. Prescription will be sent to patient's pharmacy for a low-dose medication to lower cholesterol if he has not currently been prescribed this medication

## 2018-08-01 NOTE — Telephone Encounter (Signed)
Patient called to request his lab results and nurse was not available, please follow up

## 2018-08-03 ENCOUNTER — Telehealth (INDEPENDENT_AMBULATORY_CARE_PROVIDER_SITE_OTHER): Payer: Self-pay

## 2018-08-03 NOTE — Telephone Encounter (Signed)
-----   Message from Cammie Fulp, MD sent at 07/30/2018  6:15 PM EST ----- Notify patient that his blood sugar was 354 and his recent blood work.  CMP was otherwise normal.  Please find out if patient has checked his blood sugar the day and if so what is the value.  Please make sure or asked patient if he has obtained his medications for treatment of diabetes.  Patient with a mild increase in bad cholesterol at 113 but because he is diabetic, his goal LDL is 70 or less.  Prescription will be sent to patient's pharmacy for a low-dose medication to lower cholesterol if he has not currently been prescribed this medication 

## 2018-08-03 NOTE — Telephone Encounter (Signed)
-----   Message from Cain Saupeammie Fulp, MD sent at 07/30/2018  6:15 PM EST ----- Notify patient that his blood sugar was 354 and his recent blood work.  CMP was otherwise normal.  Please find out if patient has checked his blood sugar the day and if so what is the value.  Please make sure or asked patient if he has obtained his medications for treatment of diabetes.  Patient with a mild increase in bad cholesterol at 113 but because he is diabetic, his goal LDL is 70 or less.  Prescription will be sent to patient's pharmacy for a low-dose medication to lower cholesterol if he has not currently been prescribed this medication

## 2018-08-03 NOTE — Telephone Encounter (Signed)
Patient verified DOB. He is aware that his blood sugar was 354. CMP otherwise normal. Patient stated he did check his sugar the day of his appointment and it was 3 something. He has picked up medications for diabetes. He is aware that there is mild increase in his bad cholesterol, goal is below 70. He is aware that prescription for low dose cholesterol medication has been sent to pharmacy. Patient will pick up on 11/22 and speak with Franky MachoLuke to be educated on how to take the medication. Maryjean Mornempestt S Makailah Slavick, CMA

## 2018-08-10 ENCOUNTER — Encounter: Payer: Self-pay | Admitting: Pharmacist

## 2018-08-10 ENCOUNTER — Ambulatory Visit: Payer: Self-pay | Attending: Family Medicine | Admitting: Pharmacist

## 2018-08-10 DIAGNOSIS — E089 Diabetes mellitus due to underlying condition without complications: Secondary | ICD-10-CM

## 2018-08-10 DIAGNOSIS — E119 Type 2 diabetes mellitus without complications: Secondary | ICD-10-CM | POA: Insufficient documentation

## 2018-08-10 DIAGNOSIS — E0865 Diabetes mellitus due to underlying condition with hyperglycemia: Secondary | ICD-10-CM

## 2018-08-10 DIAGNOSIS — Z833 Family history of diabetes mellitus: Secondary | ICD-10-CM | POA: Insufficient documentation

## 2018-08-10 LAB — GLUCOSE, POCT (MANUAL RESULT ENTRY): POC Glucose: 312 mg/dL — AB (ref 70–99)

## 2018-08-10 MED ORDER — INSULIN NPH ISOPHANE & REGULAR (70-30) 100 UNIT/ML ~~LOC~~ SUSP: 10.0000 [IU] | Freq: Two times a day (BID) | SUBCUTANEOUS | 2 refills | Status: DC

## 2018-08-10 MED ORDER — INSULIN NPH ISOPHANE & REGULAR (70-30) 100 UNIT/ML ~~LOC~~ SUSP: 10.0000 [IU] | Freq: Two times a day (BID) | SUBCUTANEOUS | 11 refills | Status: DC

## 2018-08-10 NOTE — Patient Instructions (Signed)
Thank you for coming to see me today. Please do the following:  1. Stop Lantus. 2. Start Novolin 70/30 10 units twice a day. If you need to increase to 15, please do.  3. Continue checking blood sugars at home. 4. Continue making the lifestyle changes we've discussed together during our visit. Diet and exercise play a significant role in improving your blood sugars.  5. Follow-up with Dr. Jillyn HiddenFulp.

## 2018-08-10 NOTE — Progress Notes (Signed)
S:    PCP: Dr. Jillyn HiddenFulp  No chief complaint on file.  Patient arrives in poor spirits. Presents for diabetes management at the request of Dr. Jillyn HiddenFulp. Patient was referred on 07/12/18. I last saw him 07/27/18. Stopped mixed insulin d/t hypoglycemia and started Lantus.  Today, he reports not taking Lantus as instructed. He initially titrated to 15 units but did not increase past this despite very elevated sugars at home. Upon questioning, pt is frustrated and states that "this medication is not working for me". When asked why he did not continue to titrate his insulin, he states that it makes him "break out into sweats". "I want to go back to the mixed insulin so I can self-medicate".   Family/Social History:  - FHx: DM (3 siblings) - Tobacco: 1 cigarette q2weeks - Alcohol: denies  Insurance coverage/medication affordability:  - Self-pay  Patient denies adherence with medications.  Current diabetes medications include:  - Lantus (self titration)  Patient denies hypoglycemic events.   Patient reported dietary habits:  - Occasionally drinks sweet tea - Pt reports limiting carbohydrates   Patient-reported exercise habits:  - Reports occasional    Patient reports polyuria. Denies polyphagia or polydipsia  Patient denies neuropathy. Patient denies visual changes.  O:  POCT 312 . Home glucose levels: reports 200s-400s  Lab Results  Component Value Date   HGBA1C 12.2 (A) 07/12/2018   HGBA1C 12.2 07/12/2018   HGBA1C 12.2 (A) 07/12/2018   HGBA1C 12.2 (A) 07/12/2018   There were no vitals filed for this visit.  Lipid Panel     Component Value Date/Time   CHOL 183 07/27/2018 1615   TRIG 125 07/27/2018 1615   HDL 45 07/27/2018 1615   CHOLHDL 4.1 07/27/2018 1615   CHOLHDL 4.0 04/09/2014 1106   VLDL 18 04/09/2014 1106   LDLCALC 113 (H) 07/27/2018 1615   Clinical ASCVD: No  10 year ASCVD risk: 11%  A/P: Diabetes longstanding currently uncontrolled. Patient is able to  verbalize appropriate hypoglycemia management plan. Patient is not adherent with medication. Control is suboptimal due to medication non-adherence, sedentary lifestyle, and dietary indiscretion.  Patient cannot tolerate metformin. His A1c is > 11. He does not wish to increase Lantus but wants to go back on Novolin 70/30 despite hypoglycemia in the past. I expressed my concern with this but he insists on changing. Will place him back on 70/30 at 10 units BID. Advised him to contact his doctor before self-dosing his insulin.   Additionally, we had a lengthy discussion about ASCVD risk and DM. Pt does not wish to take a statin. "My doctor told me my cholesterol was perfect". Upon review of his charts, Dr. Jillyn HiddenFulp left clear instructions that pt's LDL was elevated and that he is to start a statin. Will defer to PCP as patient refused today.  Also explained his need for health maintenance and screening. He refused. When I attempted to discuss the benefits of the flu and pneumonia vaccine, his mother spoke over me to let me know that "they don't believe in that stuff". Will defer to PCP.   -Stopped Lantus -Started Novolin 70/30 10 units BID.  -Refused statin -Refused PNA and influenza vaccine -Extensively discussed pathophysiology of DM, recommended lifestyle interventions, dietary effects on glycemic control -Counseled on s/sx of and management of hypoglycemia -Next A1C anticipated 09/2018.   Written patient instructions provided.  Total time in face to face counseling 15 minutes.   Follow up Pharmacist Clinic Visit 08/10/18.  Benard Halsted, PharmD, [place] [phone]

## 2018-08-23 ENCOUNTER — Ambulatory Visit: Payer: Self-pay | Attending: Family Medicine | Admitting: Family Medicine

## 2018-08-23 ENCOUNTER — Encounter: Payer: Self-pay | Admitting: Family Medicine

## 2018-08-23 VITALS — BP 127/88 | HR 92 | Temp 98.1°F | Ht 72.0 in | Wt 137.4 lb

## 2018-08-23 DIAGNOSIS — E785 Hyperlipidemia, unspecified: Secondary | ICD-10-CM

## 2018-08-23 DIAGNOSIS — E089 Diabetes mellitus due to underlying condition without complications: Secondary | ICD-10-CM

## 2018-08-23 DIAGNOSIS — Z87891 Personal history of nicotine dependence: Secondary | ICD-10-CM | POA: Insufficient documentation

## 2018-08-23 DIAGNOSIS — Z794 Long term (current) use of insulin: Secondary | ICD-10-CM

## 2018-08-23 DIAGNOSIS — E1165 Type 2 diabetes mellitus with hyperglycemia: Secondary | ICD-10-CM

## 2018-08-23 DIAGNOSIS — Z79899 Other long term (current) drug therapy: Secondary | ICD-10-CM | POA: Insufficient documentation

## 2018-08-23 DIAGNOSIS — Z833 Family history of diabetes mellitus: Secondary | ICD-10-CM | POA: Insufficient documentation

## 2018-08-23 DIAGNOSIS — Z9889 Other specified postprocedural states: Secondary | ICD-10-CM | POA: Insufficient documentation

## 2018-08-23 LAB — GLUCOSE, POCT (MANUAL RESULT ENTRY): POC Glucose: 375 mg/dL — AB (ref 70–99)

## 2018-08-23 MED ORDER — ROSUVASTATIN CALCIUM 10 MG PO TABS: 10.0000 mg | ORAL_TABLET | Freq: Every day | ORAL | 6 refills | Status: DC

## 2018-08-23 NOTE — Progress Notes (Signed)
Subjective:    Patient ID: [name], male    DOB: 28-Dec-1975, 42 y.o.   MRN: [id number]  HPI       42 yo male last seen in the office on 07/12/18 to establish care for poorly controlled type 2 diabetes for which patient has also been seen by the clinical pharmacist twice since his last visit here. Patient was placed on Lantus which he was to titrate but he stopped increasing at 15 units daily. Patient is now back on 70/30 per patient request. Please see patient's 08/10/18 visit note with the clinical pharmacist.       Patient reports that he is taking 70/30 insulin as he felt that the Lantus was not working well for him.  Patient states that the Lantus sometimes made him feel sweaty/shaky but patient did not have actual hypoglycemia when he checked his blood sugars during on those occasions.  Patient states that he sometimes only takes his 70/30 once daily or modifies his dose depending on how his sugars are running when he checks them.  Patient states if his blood sugars are in the high 100s to low 200s then he will sometimes skip his morning insulin so that his blood sugar does not drop later in the day and patient sometimes skips doses.  Patient states that on average his home blood sugars usually stay in the 200-300 range.  Patient does have some issues with increased thirst and occasional urinary frequency but he realizes that his urinary frequency usually correlates with his blood sugars being higher.      Patient states that he was not aware that he needed cholesterol medication.  Patient does have family history of brothers with complications from diabetes. Patient states that his family does not really believe in statin medication but if it will help reduce his risk of heart attack then he would like to at least try the medication to see if he tolerates it.  Patient denies any current issues with chest pain.  Patient does have some fatigue.  Patient denies any current headaches or  dizziness.  Patient denies any peripheral edema.  Patient denies any abdominal pain, no nausea or vomiting.  Past Medical History:  Diagnosis Date  . Diabetes mellitus    Newly diagnosed, May 2012   Past Surgical History:  Procedure Laterality Date  . APPENDECTOMY    . TONSILLECTOMY     Family History  Problem Relation Age of Onset  . Hypertension Mother   . Diabetes Sister        Several brothers and sister with DM  . Diabetes Brother    Social History   Tobacco Use  . Smoking status: Former Smoker    Last attempt to quit: 10/20/2015    Years since quitting: 2.9  . Smokeless tobacco: Former Neurosurgeon    Quit date: 10/20/2015  . Tobacco comment: Smoking 1 cigs every 4 days  Substance Use Topics  . Alcohol use: No    Alcohol/week: 0.0 standard drinks  . Drug use: Yes    Types: Marijuana    Comment: last use 2-3 days ago  No Known Allergies    Review of Systems  Constitutional: Positive for fatigue. Negative for chills and fever.  HENT: Negative for sore throat and trouble swallowing.   Eyes: Positive for visual disturbance (decreased visual acuity and occasional blurred vision). Negative for photophobia.  Respiratory: Negative for cough and shortness of breath.   Cardiovascular: Negative for chest pain, palpitations and  leg swelling.  Gastrointestinal: Negative for abdominal pain and nausea.  Genitourinary: Positive for frequency. Negative for dysuria.  Musculoskeletal: Positive for myalgias. Negative for gait problem.  Neurological: Negative for dizziness and headaches.       Objective:   Physical Exam BP 127/88   Pulse 92   Temp 98.1 F (36.7 C) (Oral)   Ht 6' (1.829 m)   Wt 137 lb 6.4 oz (62.3 kg)   SpO2 100%   BMI 18.63 kg/m Nurse's notes and vital signs reviewed General- tall, thin framed young adult male in no acute distress sitting on the exam table who is accompanied by his elderly appearing mother at today's visit Neck-supple, no  lymphadenopathy Cardiovascular-regular rate and rhythm Lungs-clear to auscultation bilaterally Abdomen-soft, nontender Back-no CVA tenderness Extremities-no edema Foot exam-patient had normal monofilament exam at his last visit in October 2019.  Patient has no active skin breakdown on the feet.  Patient does have hammertoe deformities as well as overlapping of the second toe over the left great toe Psych- patient with a slightly odd affect, patient appears to have normal judgment but there is some question as to if he understands what is being explained to him regarding his control of diabetes and pathophysiology of diabetes even when simple terms are used        Assessment & Plan:  1. Diabetes mellitus due to underlying condition without complication, unspecified whether long term insulin use (HCC) Patient had point-of-care glucose testing at today's visit as part of protocol due as he is diabetic.  Patient's blood sugar is elevated to 375 at today's visit.  Patient reports that he ate shortly before today's visit.  See note from clinical pharmacist.  Patient with a history of noncompliance with medications and recently requested to be changed back to 70/30 insulin after patient failed to comply with titration of Lantus - POCT glucose (manual entry)  2. Type 2 diabetes mellitus with hyperglycemia, with long-term current use of insulin (HCC) Patient is encouraged to schedule appointment with the financial counselors to see if he is eligible for any programs to help with medical costs.  Patient would greatly benefit from referral to endocrinology to help with improved management of his diabetes as patient's hemoglobin A1c at the end of October was elevated at 12.2 and patient with elevated blood sugar at today's visit of 375.  Patient did meet with the clinical pharmacist on 08/10/2018 but reported that he was not taking the Lantus as prescribed as he did not feel that the medication was working  for him and he wanted to go back to 70/30 insulin which he had been on in the past.  Please see patient's note with the clinical pharmacist from 08/10/2018 as patient was very resistant to diabetic teaching and was insistent on returning to 70/30 insulin use even though he had had issues with hypoglycemia with use of this medication in the past as patient told the pharmacist that it was easier for him to "self medicate-" with 70/30 insulin use.  Patient also with history and current practice of dietary indiscretion as well as intermittent use/noncompliance with prescribed medications.  Patient was again offered influenza immunization and pneumococcal vaccination at today's visit which he declined.  Patient will return later this month or January for hemoglobin A1c.  Patient is being referred to optometry for diabetic eye exam.  Patient did reluctantly agrees to start Crestor for hyperlipidemia associated with diabetes after a long discussion regarding increased risk of coronary artery disease  associated with diabetes - Ambulatory referral to Optometry - Lipid panel; Future - Comprehensive metabolic panel; Future - Hemoglobin A1c; Future - rosuvastatin (CRESTOR) 10 MG tablet; Take 1 tablet (10 mg total) by mouth daily. To lower cholesterol  Dispense: 30 tablet; Refill: 6  3. Hyperlipidemia LDL goal <70 Patient had lipid panel done on 07/27/2018 with LDL of 113.  Because patient is diabetic, statin medication is also recommended to help reduce the risk of CAD associated with diabetes and discussed with the patient that his LDL goal is 70 or less.  Patient will have repeat lipid panel in 6 to 12 months.  Patient is encouraged to follow a low carbohydrate diet and exercise as tolerated to help control lipids.  Patient will have CMP approximately 4 to 6 weeks after starting cholesterol medication - Lipid panel; Future - Comprehensive metabolic panel; Future - rosuvastatin (CRESTOR) 10 MG tablet; Take 1 tablet  (10 mg total) by mouth daily. To lower cholesterol  Dispense: 30 tablet; Refill: 6  An After Visit Summary was printed and given to the patient.  Return for DM-labs in Jan; Feb office visit.

## 2018-08-23 NOTE — Patient Instructions (Signed)
Cholesterol Cholesterol is a fat. Your body needs a small amount of cholesterol. Cholesterol (plaque) may build up in your blood vessels (arteries). That makes you more likely to have a heart attack or stroke. You cannot feel your cholesterol level. Having a blood test is the only way to find out if your level is high. Keep your test results. Work with your doctor to keep your cholesterol at a good level. What do the results mean?  Total cholesterol is how much cholesterol is in your blood.  LDL is bad cholesterol. This is the type that can build up. Try to have low LDL.  HDL is good cholesterol. It cleans your blood vessels and carries LDL away. Try to have high HDL.  Triglycerides are fat that the body can store or burn for energy. What are good levels of cholesterol?  Total cholesterol below 200.  LDL below 100 is good for people who have health risks. LDL below 70 is good for people who have very high risks.  HDL above 40 is good. It is best to have HDL of 60 or higher.  Triglycerides below 150. How can I lower my cholesterol? Diet Follow your diet program as told by your doctor.  Choose fish, white meat chicken, or turkey that is roasted or baked. Try not to eat red meat, fried foods, sausage, or lunch meats.  Eat lots of fresh fruits and vegetables.  Choose whole grains, beans, pasta, potatoes, and cereals.  Choose olive oil, corn oil, or canola oil. Only use small amounts.  Try not to eat butter, mayonnaise, shortening, or palm kernel oils.  Try not to eat foods with trans fats.  Choose low-fat or nonfat dairy foods. ? Drink skim or nonfat milk. ? Eat low-fat or nonfat yogurt and cheeses. ? Try not to drink whole milk or cream. ? Try not to eat ice cream, egg yolks, or full-fat cheeses.  Healthy desserts include angel food cake, ginger snaps, animal crackers, hard candy, popsicles, and low-fat or nonfat frozen yogurt. Try not to eat pastries, cakes, pies, and  cookies.  Exercise Follow your exercise program as told by your doctor.  Be more active. Try gardening, walking, and taking the stairs.  Ask your doctor about ways that you can be more active.  Medicine  Take over-the-counter and prescription medicines only as told by your doctor. This information is not intended to replace advice given to you by your health care provider. Make sure you discuss any questions you have with your health care provider. Document Released: 12/02/2008 Document Revised: 04/06/2016 Document Reviewed: 03/17/2016 Elsevier Interactive Patient Education  2018 [employer] Inc.  

## 2018-08-29 ENCOUNTER — Ambulatory Visit: Payer: Self-pay | Admitting: Pharmacist

## 2018-09-07 ENCOUNTER — Ambulatory Visit: Payer: Self-pay

## 2018-11-07 ENCOUNTER — Ambulatory Visit: Payer: Self-pay | Attending: Family Medicine

## 2018-11-27 ENCOUNTER — Telehealth: Payer: Self-pay | Admitting: Family Medicine

## 2018-11-27 NOTE — Telephone Encounter (Signed)
LVM to Pt since his other call to inform that he will bring missing documents to complete his application, at this time is almost a month, he need to schedule a new financial appt as well bring all new document to the new appt at this time,

## 2019-11-14 ENCOUNTER — Ambulatory Visit: Payer: Self-pay | Attending: Nurse Practitioner | Admitting: Nurse Practitioner

## 2019-11-14 ENCOUNTER — Other Ambulatory Visit: Payer: Self-pay

## 2019-12-11 ENCOUNTER — Other Ambulatory Visit: Payer: Self-pay

## 2019-12-11 ENCOUNTER — Encounter: Payer: Self-pay | Admitting: Family Medicine

## 2019-12-11 ENCOUNTER — Ambulatory Visit: Payer: Self-pay | Attending: Family Medicine | Admitting: Family Medicine

## 2019-12-11 VITALS — BP 120/77 | HR 108 | Temp 98.4°F | Ht 72.0 in | Wt 142.8 lb

## 2019-12-11 DIAGNOSIS — Z79899 Other long term (current) drug therapy: Secondary | ICD-10-CM

## 2019-12-11 DIAGNOSIS — E1165 Type 2 diabetes mellitus with hyperglycemia: Secondary | ICD-10-CM

## 2019-12-11 DIAGNOSIS — E785 Hyperlipidemia, unspecified: Secondary | ICD-10-CM

## 2019-12-11 DIAGNOSIS — Z794 Long term (current) use of insulin: Secondary | ICD-10-CM

## 2019-12-11 LAB — POCT GLYCOSYLATED HEMOGLOBIN (HGB A1C): Hemoglobin A1C: 12.9 % — AB (ref 4.0–5.6)

## 2019-12-11 LAB — GLUCOSE, POCT (MANUAL RESULT ENTRY): POC Glucose: 254 mg/dL — AB (ref 70–99)

## 2019-12-11 MED ORDER — ROSUVASTATIN CALCIUM 10 MG PO TABS: 10.0000 mg | ORAL_TABLET | Freq: Every day | ORAL | 4 refills | Status: DC

## 2019-12-11 MED ORDER — NOVOLIN 70/30 (70-30) 100 UNIT/ML ~~LOC~~ SUSP: 15.0000 [IU] | Freq: Two times a day (BID) | SUBCUTANEOUS | 3 refills | Status: DC

## 2019-12-11 MED ORDER — PANTOPRAZOLE SODIUM 40 MG PO TBEC: 40.0000 mg | DELAYED_RELEASE_TABLET | Freq: Every day | ORAL | 4 refills | Status: DC

## 2019-12-11 NOTE — Progress Notes (Signed)
Subjective:  Patient ID: [name], male    DOB: 11-07-1975  Age: 44 y.o. MRN: [id number]  CC: No chief complaint on file.   HPI [name], 44 year old male, who was last seen in the office on 08/23/2018.  He has type 2 diabetes, hyperlipidemia and history of acid reflux.  His last hemoglobin A1c on 07/12/2018 was 12.2.  He reports that he continues to take his insulin and is currently on 15 units twice daily but patient states that his blood sugars are up and down and they range anywhere from the 130s to the 300s.          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  He reports that he is been a little overwhelmed with figuring everything out after her death as his mom previously handled a lot of things that he now will be responsible for.  He does not have any siblings.  Patient reports that he decided to make a follow-up appointment because now that his mother is gone, he needs to start taking care of himself and his health.  He is not currently taking his cholesterol medication and is not taking acid reflux medication.  He reports that he does not have any increased thirst or frequent urination other than the fact that he tends to drink a lot of water during the day and since he is drinking water he urinates more frequently.  Sometimes when his blood sugars are high, he will have sensation of numbness and tingling in his feet.  He denies any current issues with chest pain or palpitations, no shortness of breath or cough, no nausea/vomiting/diarrhea or constipation.  No swelling in his hands or feet.  No dysuria.  Past Medical History:  Diagnosis Date  . Diabetes mellitus    Newly diagnosed, May 2012    Past Surgical History:  Procedure Laterality Date  . APPENDECTOMY    . TONSILLECTOMY      Family History  Problem Relation Age of Onset  . Hypertension Mother   . Diabetes Sister        Several brothers and sister  with DM  . Diabetes Brother     Social History   Tobacco Use  . Smoking status: Former Smoker    Quit date: 10/20/2015    Years since quitting: 4.1  . Smokeless tobacco: Former Neurosurgeon    Quit date: 10/20/2015  . Tobacco comment: Smoking 1 cigs every 4 days  Substance Use Topics  . Alcohol use: No    Alcohol/week: 0.0 standard drinks    ROS Review of Systems  Constitutional: Positive for fatigue. Negative for chills and fever.  HENT: Negative for sore throat and trouble swallowing.   Eyes: Negative for photophobia and visual disturbance.  Respiratory: Negative for cough and shortness of breath.   Cardiovascular: Negative for chest pain, palpitations and leg swelling.  Gastrointestinal: Negative for abdominal pain, blood in stool, constipation and diarrhea.  Endocrine: Negative for polydipsia, polyphagia and polyuria.  Genitourinary: Negative for dysuria and frequency.  Musculoskeletal: Negative for arthralgias and back pain.  Neurological: Positive for numbness (occasional numbness and tingling in the feet). Negative for dizziness and headaches.  Hematological: Negative for adenopathy. Does not bruise/bleed easily.  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 Negative for self-injury and suicidal ideas.    Objective:   Today's Vitals: BP 120/77 (  BP Location: Left Arm, Patient Position: Sitting, Cuff Size: Large)   Pulse (!) 108   Temp 98.4 F (36.9 C) (Oral)   Ht 6' (1.829 m)   Wt 142 lb 12.8 oz (64.8 kg)   SpO2 98%   BMI 19.37 kg/m   Physical Exam Vitals and nursing note reviewed.  Constitutional:      General: He is not in acute distress.    Appearance: Normal appearance.  Cardiovascular:     Rate and Rhythm: Normal rate and regular rhythm.     Pulses:          Dorsalis pedis pulses are 2+ on the right side and 2+ on the left side.       Posterior tibial pulses are 2+ on the right side and 2+  on the left side.  Pulmonary:     Effort: Pulmonary effort is normal.     Breath sounds: Normal breath sounds.  Abdominal:     Palpations: Abdomen is soft.     Tenderness: There is no abdominal tenderness. There is no right CVA tenderness, left CVA tenderness, guarding or rebound.  Musculoskeletal:        General: No tenderness.     Cervical back: Neck supple. No tenderness.     Right lower leg: No edema.     Left lower leg: No edema.     Right foot: Deformity present.  Feet:     Right foot:     Skin integrity: Skin integrity normal.     Toenail Condition: Right toenails are normal.     Left foot:     Skin integrity: Skin integrity normal.     Toenail Condition: Left toenails are normal.     Comments: Second toe overlaps the great toe on the right  Lymphadenopathy:     Cervical: No cervical adenopathy.  Skin:    General: Skin is warm and dry.  Neurological:     General: No focal deficit present.     Mental Status: He is alert and oriented to person, place, and time.  Psychiatric:        Mood and Affect: Mood normal.        Behavior: Behavior normal.     Assessment & Plan:  1. Type 2 diabetes mellitus with hyperglycemia, with long-term current use of insulin (HCC) Unfortunately, lab was closed by the time that patient was seen for today's visit.  He did have hemoglobin A1c and glucose done however and had glucose of 254 and hemoglobin A1c that was elevated at 12.9.  New prescription was sent to his pharmacy for his NPH 7030 at his current 15 units daily but he was also asked to return in about 2 weeks for follow-up of his diabetes with the clinical pharmacist.  He is to bring his glucometer as well as of bringing a blood sugar diary.  Lab orders were also placed for CMP, lipid panel and urine creatinine microalbumin as future labs for when he returns to meet with clinical pharmacist. - HgB A1c - Glucose (CBG) - rosuvastatin (CRESTOR) 10 MG tablet; Take 1 tablet (10 mg total) by  mouth daily. To lower cholesterol  Dispense: 30 tablet; Refill: 4 - Comprehensive metabolic panel; Future - Lipid panel; Future - Microalbumin/Creatinine Ratio, Urine; Future  2. Hyperlipidemia LDL goal <70 New prescription provided for rosuvastatin 10 mg for hyperlipidemia and patient will have lipid panel at his upcoming lab visit/follow-up with clinical pharmacist. - rosuvastatin (CRESTOR) 10 MG tablet; Take 1  tablet (10 mg total) by mouth daily. To lower cholesterol  Dispense: 30 tablet; Refill: 4  3. Long term use of medications Patient will have comprehensive metabolic panel in follow-up of long-term use of medications for treatment of hypertension and hyperlipidemia at upcoming lab visit. -Comprehensive metabolic panel; Future  -Refill also provided of pantoprazole  Outpatient Encounter Medications as of 12/11/2019  Medication Sig  . insulin NPH-regular Human (NOVOLIN 70/30) (70-30) 100 UNIT/ML injection Inject 10 Units into the skin 2 (two) times daily with a meal.  . menthol-cetylpyridinium (CEPACOL) 3 MG lozenge Take 1 lozenge (3 mg total) by mouth as needed for sore throat. (Patient not taking: Reported on 07/12/2018)  . pantoprazole (PROTONIX) 40 MG tablet Take 1 tablet (40 mg total) by mouth daily. (Patient not taking: Reported on 07/12/2018)  . phenol (CHLORASEPTIC) 1.4 % LIQD Use as directed 1 spray in the mouth or throat as needed for throat irritation / pain. (Patient not taking: Reported on 07/12/2018)  . rosuvastatin (CRESTOR) 10 MG tablet Take 1 tablet (10 mg total) by mouth daily. To lower cholesterol (Patient not taking: Reported on 12/11/2019)   No facility-administered encounter medications on file as of 12/11/2019.    An After Visit Summary was printed and given to the patient.   Follow-up: Return in about 3 months (around 03/12/2020) for DM- 2 week f/u with CPP/Luke/Labs.    Antony Blackbird MD

## 2019-12-11 NOTE — Patient Instructions (Signed)

## 2019-12-11 NOTE — Progress Notes (Signed)
Est care   DM   CBG today was 254  A1c 12.9

## 2019-12-12 MED FILL — !NOVOLIN 70/30 100 UNITS/ML: (70-30) 100 | 28 days supply | Qty: 10 | Fill #0

## 2019-12-12 MED FILL — PANTOPRAZOLE SOD DR 40 MG T: 40 | 30 days supply | Qty: 30 | Fill #0

## 2019-12-12 MED FILL — ROSUVASTATIN CALCIUM 10 MG: 10 | 30 days supply | Qty: 30 | Fill #0

## 2019-12-18 ENCOUNTER — Ambulatory Visit: Payer: Self-pay | Admitting: Pharmacist

## 2019-12-25 ENCOUNTER — Ambulatory Visit: Payer: Self-pay | Attending: Family Medicine | Admitting: Pharmacist

## 2019-12-25 ENCOUNTER — Telehealth: Payer: Self-pay | Admitting: Pharmacist

## 2019-12-25 ENCOUNTER — Other Ambulatory Visit: Payer: Self-pay

## 2019-12-25 DIAGNOSIS — Z794 Long term (current) use of insulin: Secondary | ICD-10-CM

## 2019-12-25 DIAGNOSIS — Z79899 Other long term (current) drug therapy: Secondary | ICD-10-CM

## 2019-12-25 DIAGNOSIS — E785 Hyperlipidemia, unspecified: Secondary | ICD-10-CM

## 2019-12-25 DIAGNOSIS — E1165 Type 2 diabetes mellitus with hyperglycemia: Secondary | ICD-10-CM

## 2019-12-25 NOTE — Progress Notes (Signed)
    S:    PCP: Dr. Jillyn Hidden  No chief complaint on file.  Patient arrives in good spirits. Presents for diabetes management at the request of Dr. Jillyn Hidden. Patient was referred on 12/11/19.  Patient has a history of non-compliance. I have seen him before and he is reluctant to increase his dose secondary to relative hypoglycemia.   Family/Social History:  - FHx: DM (3 siblings) - Tobacco: 1 cigarette q2weeks - Alcohol: denies  Insurance coverage/medication affordability:  - Self-pay  Patient reports adherence with medications.  Current diabetes medications include:  - Novolin 70/30 15 units BID  Patient denies hypoglycemic events.   Patient reported dietary habits:  - Occasionally drinks sweet tea, orange juice - Pt reports trying to do better with carbohydrates   Patient-reported exercise habits:  - Reports occasional    Patient reports polyuria. Denies polyphagia or polydipsia  Patient reports occasional neuropathy. Patient denies visual changes.  O:  POCT: 297 Home glucose levels: reports mainly 200s. Givwes wide range of 100s-300s.   Lab Results  Component Value Date   HGBA1C 12.9 (A) 12/11/2019   There were no vitals filed for this visit.  Lipid Panel     Component Value Date/Time   CHOL 183 07/27/2018 1615   TRIG 125 07/27/2018 1615   HDL 45 07/27/2018 1615   CHOLHDL 4.1 07/27/2018 1615   CHOLHDL 4.0 04/09/2014 1106   VLDL 18 04/09/2014 1106   LDLCALC 113 (H) 07/27/2018 1615   Clinical ASCVD: No  10 year ASCVD risk: 11%  A/P: Diabetes longstanding currently uncontrolled. Patient is able to verbalize appropriate hypoglycemia management plan. Patient is adherent with medication.   -Inc dose of Novolin 70/30 to 18 units BID.  -Extensively discussed pathophysiology of DM, recommended lifestyle interventions, dietary effects on glycemic control -Counseled on s/sx of and management of hypoglycemia -Next A1C anticipated 02/2020.   Written patient instructions  provided.  Total time in face to face counseling 15 minutes.   Follow up Pharmacist Clinic Visit 2-3 weeks.    Butch Penny, PharmD, CPP Clinical Pharmacist [facility] [phone]

## 2019-12-25 NOTE — Telephone Encounter (Signed)
Error

## 2019-12-26 ENCOUNTER — Other Ambulatory Visit: Payer: Self-pay | Admitting: Family Medicine

## 2019-12-26 DIAGNOSIS — Z794 Long term (current) use of insulin: Secondary | ICD-10-CM

## 2019-12-26 DIAGNOSIS — R7989 Other specified abnormal findings of blood chemistry: Secondary | ICD-10-CM

## 2019-12-26 DIAGNOSIS — Z79899 Other long term (current) drug therapy: Secondary | ICD-10-CM

## 2019-12-26 DIAGNOSIS — E1165 Type 2 diabetes mellitus with hyperglycemia: Secondary | ICD-10-CM

## 2019-12-26 LAB — COMPREHENSIVE METABOLIC PANEL WITH GFR
ALT: 57 IU/L — ABNORMAL HIGH (ref 0–44)
AST: 21 IU/L (ref 0–40)
Albumin/Globulin Ratio: 1.7 (ref 1.2–2.2)
Albumin: 4.3 g/dL (ref 4.0–5.0)
Alkaline Phosphatase: 126 IU/L — ABNORMAL HIGH (ref 39–117)
BUN/Creatinine Ratio: 12 (ref 9–20)
BUN: 18 mg/dL (ref 6–24)
Bilirubin Total: 0.4 mg/dL (ref 0.0–1.2)
CO2: 26 mmol/L (ref 20–29)
Calcium: 9.7 mg/dL (ref 8.7–10.2)
Chloride: 97 mmol/L (ref 96–106)
Creatinine, Ser: 1.45 mg/dL — ABNORMAL HIGH (ref 0.76–1.27)
GFR calc Af Amer: 68 mL/min/1.73
GFR calc non Af Amer: 59 mL/min/1.73 — ABNORMAL LOW
Globulin, Total: 2.6 g/dL (ref 1.5–4.5)
Glucose: 294 mg/dL — ABNORMAL HIGH (ref 65–99)
Potassium: 4 mmol/L (ref 3.5–5.2)
Sodium: 139 mmol/L (ref 134–144)
Total Protein: 6.9 g/dL (ref 6.0–8.5)

## 2019-12-26 LAB — LIPID PANEL
Chol/HDL Ratio: 4 ratio (ref 0.0–5.0)
Cholesterol, Total: 195 mg/dL (ref 100–199)
HDL: 49 mg/dL
LDL Chol Calc (NIH): 120 mg/dL — ABNORMAL HIGH (ref 0–99)
Triglycerides: 148 mg/dL (ref 0–149)
VLDL Cholesterol Cal: 26 mg/dL (ref 5–40)

## 2019-12-30 ENCOUNTER — Encounter: Payer: Self-pay | Admitting: *Deleted

## 2020-01-03 ENCOUNTER — Other Ambulatory Visit: Payer: Self-pay

## 2020-01-03 ENCOUNTER — Ambulatory Visit: Payer: Self-pay | Attending: Family Medicine

## 2020-03-12 ENCOUNTER — Ambulatory Visit: Payer: Self-pay | Admitting: Family Medicine

## 2020-04-09 ENCOUNTER — Ambulatory Visit: Payer: Self-pay | Attending: Family | Admitting: Family

## 2020-04-09 ENCOUNTER — Other Ambulatory Visit: Payer: Self-pay

## 2020-04-09 ENCOUNTER — Encounter: Payer: Self-pay | Admitting: Family

## 2020-04-09 VITALS — BP 143/91 | HR 101 | Resp 16 | Wt 144.4 lb

## 2020-04-09 DIAGNOSIS — E1165 Type 2 diabetes mellitus with hyperglycemia: Secondary | ICD-10-CM

## 2020-04-09 DIAGNOSIS — Z794 Long term (current) use of insulin: Secondary | ICD-10-CM

## 2020-04-09 DIAGNOSIS — E785 Hyperlipidemia, unspecified: Secondary | ICD-10-CM

## 2020-04-09 LAB — GLUCOSE, POCT (MANUAL RESULT ENTRY): POC Glucose: 318 mg/dl — AB (ref 70–99)

## 2020-04-09 LAB — POCT GLYCOSYLATED HEMOGLOBIN (HGB A1C): HbA1c, POC (controlled diabetic range): 12.3 % — AB (ref 0.0–7.0)

## 2020-04-09 MED ORDER — ROSUVASTATIN CALCIUM 10 MG PO TABS: 10.0000 mg | ORAL_TABLET | Freq: Every day | ORAL | 3 refills | Status: DC

## 2020-04-09 MED ORDER — NOVOLIN 70/30 (70-30) 100 UNIT/ML ~~LOC~~ SUSP: 21.0000 [IU] | Freq: Two times a day (BID) | SUBCUTANEOUS | 3 refills | Status: DC

## 2020-04-09 MED FILL — ROSUVASTATIN CALCIUM 10 MG: 10 | 30 days supply | Qty: 30 | Fill #0

## 2020-04-09 MED FILL — NOVOLIN 70/30 100 UNITS/ML: (70-30) 100 | 23 days supply | Qty: 10 | Fill #0

## 2020-04-09 NOTE — Progress Notes (Signed)
Patient ID: [name], male    DOB: 05/01/1976  MRN: [id number]  CC: Diabetes Follow-Up   Subjective: [name] is a 44 y.o. male with history of diabetes mellitus due to underlying condition without complications, diabetic ketoacidoses, acute kidney injury, smoking, leukocytosis, normocytic anemia, and hyperbilirubinemia who presents for diabetes follow-up.  1. DIABETES TYPE 2 FOLLOW-UP: Last A1C: 12.3% on 04/09/2020  Are you fasting today: []  Yes [x]  No, Ensure prior to appointment   Have you taken your anti-diabetic medications today: [x]  Yes []  No  Med Adherence:  [x]  Yes    []  No Medication side effects:  []  Yes    [x]  No Home Monitoring?  [x]  Yes    []  No Home glucose results range: 150-200 Diet Adherence: []  Yes    [x]  No, trying to cut back on bread  Exercise: [x]  Yes, every other day  Hypoglycemic episodes?: []  Yes    [x]  No Numbness of the feet? [x]  Yes, sometimes  Retinopathy hx? []  Yes    [x]  No Last eye exam: 3 years ago  Comments: Last visit 12/11/2019 with Dr. . During that encounter new prescription for NPH 70/30 at 15 units daily and follow-up with clinical pharmacist.  Last visit 12/25/2019 with clinical pharmacist. During that encounter NPH 70/30 increased to 18 units twice daily.   2. HYPERLIPIDEMIA FOLLOW-UP:  Last Lipid Panel results:  HDL  Date Value Ref Range Status  12/25/2019 49 >39 mg/dL Final   Triglycerides  Date Value Ref Range Status  12/25/2019 148 0 - 149 mg/dL Final    Are you fasting today: []  Yes [x]  No Med Adherence: []  Yes    [x]  No, reports he does not feel like the medication is helping with anything and has not taken for at least 2 months Medication side effects: []  Yes    [x]  No Muscle aches:  []  Yes    []  No Diet Adherence: []  Yes    []  No Comments: Last visit 12/11/2019 with Dr. . During that encounter new prescription for Rosuvastatin.    Patient Active Problem List   Diagnosis Date Noted  . Leukocytosis  03/22/2018  . Normocytic anemia 03/22/2018  . Increased anion gap metabolic acidosis 03/22/2018  . Hyperbilirubinemia 03/22/2018  . AKI (acute kidney injury) (HCC) 03/21/2018  . DKA (diabetic ketoacidoses) (HCC) 01/26/2016  . Diabetes mellitus due to underlying condition without complications (HCC) 04/08/2014  . Smoking 04/08/2014     Current Outpatient Medications on File Prior to Visit  Medication Sig Dispense Refill  . insulin NPH-regular Human (NOVOLIN 70/30) (70-30) 100 UNIT/ML injection Inject 15 Units into the skin 2 (two) times daily with a meal. 10 mL 3  . menthol-cetylpyridinium (CEPACOL) 3 MG lozenge Take 1 lozenge (3 mg total) by mouth as needed for sore throat. (Patient not taking: Reported on 07/12/2018) 100 tablet 12  . pantoprazole (PROTONIX) 40 MG tablet Take 1 tablet (40 mg total) by mouth daily. 30 tablet 4  . phenol (CHLORASEPTIC) 1.4 % LIQD Use as directed 1 spray in the mouth or throat as needed for throat irritation / pain. (Patient not taking: Reported on 07/12/2018) 1 Bottle 0  . rosuvastatin (CRESTOR) 10 MG tablet Take 1 tablet (10 mg total) by mouth daily. To lower cholesterol 30 tablet 4   No current facility-administered medications on file prior to visit.    No Known Allergies  Social History   Socioeconomic History  . Marital status: Single    Spouse name:  Not on file  . Number of children: Not on file  . Years of education: Not on file  . Highest education level: Not on file  Occupational History  . Not on file  Tobacco Use  . Smoking status: Former Smoker    Quit date: 10/20/2015    Years since quitting: 4.4  . Smokeless tobacco: Former Neurosurgeon    Quit date: 10/20/2015  . Tobacco comment: Smoking 1 cigs every 4 days  Substance and Sexual Activity  . Alcohol use: No    Alcohol/week: 0.0 standard drinks  . Drug use: Yes    Types: Marijuana    Comment: last use 2-3 days ago  . Sexual activity: Not on file  Other Topics Concern  . Not on file    Social History Narrative   Currently lives with his parents in [place]. Works at [employer]. Denies any current alcohol, tobacco or illicit drug use.         Social Determinants of Health   Financial Resource Strain:   . Difficulty of Paying Living Expenses:   Food Insecurity:   . Worried About Programme researcher, broadcasting/film/video in the Last Year:   . Barista in the Last Year:   Transportation Needs:   . Freight forwarder (Medical):   [name] Kitchen Lack of Transportation (Non-Medical):   Physical Activity:   . Days of Exercise per Week:   . Minutes of Exercise per Session:   Stress:   . Feeling of Stress :   Social Connections:   . Frequency of Communication with Friends and Family:   . Frequency of Social Gatherings with Friends and Family:   . Attends Religious Services:   . Active Member of Clubs or Organizations:   . Attends Banker Meetings:   [name] Kitchen Marital Status:   Intimate Partner Violence:   . Fear of Current or Ex-Partner:   . Emotionally Abused:   [name] Kitchen Physically Abused:   . Sexually Abused:     Family History  Problem Relation Age of Onset  . Hypertension Mother   . Diabetes Sister        Several brothers and sister with DM  . Diabetes Brother     Past Surgical History:  Procedure Laterality Date  . APPENDECTOMY    . TONSILLECTOMY      ROS: Review of Systems Negative except as stated above  PHYSICAL EXAM: Vitals with BMI 04/09/2020 12/11/2019 08/23/2018  Height - 6\' 0"  6\' 0"   Weight 144 lbs 6 oz 142 lbs 13 oz 137 lbs 6 oz  BMI - 19.36 18.63  Systolic 143 120  Diastolic 91 77 88  Pulse 101 108 92  SpO2- 95%, room air   Wt Readings from Last 3 Encounters:  04/09/20 144 lb 6.4 oz (65.5 kg)  12/11/19 142 lb 12.8 oz (64.8 kg)  08/23/18 137 lb 6.4 oz (62.3 kg)   Physical Exam General appearance - alert, well appearing, and in no distress and oriented to person, place, and time Mental status - alert, oriented to person, place, and time, normal  mood, behavior, speech, dress, motor activity, and thought processes Neck - supple, no significant adenopathy Lymphatics - no palpable lymphadenopathy, no hepatosplenomegaly Chest - clear to auscultation, no wheezes, rales or rhonchi, symmetric air entry, no tachypnea, retractions or cyanosis Heart - normal rate, regular rhythm, normal S1, S2, no murmurs, rubs, clicks or gallops Neurological - alert, oriented, normal speech, no focal findings or movement disorder noted,  neck supple without rigidity, cranial nerves II through XII intact, motor and sensory grossly normal bilaterally, normal muscle tone, no tremors, strength 5/5, Romberg sign negative, normal gait and station Musculoskeletal - no joint tenderness, deformity or swelling, no muscular tenderness noted, second toe overlaps the great toe on the right Extremities - peripheral pulses normal, no pedal edema, no clubbing or cyanosis, feet normal, good pulses, normal color, temperature and sensation Skin - normal coloration and turgor, no rashes, no suspicious skin lesions noted  Results for orders placed or performed in visit on 04/09/20  Glucose (CBG)  Result Value Ref Range   POC Glucose 318 (A) 70 - 99 mg/dl  POCT glycosylated hemoglobin (Hb A1C)  Result Value Ref Range   Hemoglobin A1C     HbA1c POC (<> result, manual entry)     HbA1c, POC (prediabetic range)     HbA1c, POC (controlled diabetic range) 12.3 (A) 0.0 - 7.0 %    ASSESSMENT AND PLAN: 1. Type 2 diabetes mellitus with hyperglycemia, with long-term current use of insulin (HCC): -Diabetes uncontrolled. -CBG elevated in clinic today, had Ensure prior to appointment, asymptomatic. Hemoglobin A1C not at goal at 12.3%, goal < 7%. Previous A1C 12.9% on 12/11/2019. -Increase Insulin NPH 70/30 from 18 units twice daily to 21 units twice daily.  -Follow-up with clinical pharmacist in 4 weeks for diabetes checkup. Write your home blood sugar results down each day and bring those  results to your appointment along with your home glucose monitor. Medications may be adjusted at that time. -To achieve an A1C goal of less than or equal to 7.0 percent, a fasting blood sugar of 80 to 130 mg/dL and a postprandial glucose (90 to 120 minutes after a meal) less than 180 mg/dL. In the event of sugars less than 60 mg/dl or greater than 696400 mg/dl please notify the clinic ASAP. It is recommended that you undergo annual eye exams and annual foot exams. -Discussed the importance of healthy eating habits, low-carbohydrate diet, low-sugar diet, regular aerobic exercise (at least 150 minutes a week as tolerated) and medication compliance to achieve or maintain control of diabetes.  -Patient reports he does not plan to take Rosuvastatin because he does not feel the medication is helping with anything.  -Counseled patient that Rosuvastatin is recommended to prevent cardiovascular events such as heart attack and/or stroke. Patient verbalized understanding and declined management at this time. -Last lipid panel obtained 12/25/2019. -CMP last obtained 12/25/2019.  -BMP to evaluate kidney function and electrolyte balance.  -Follow-up with primary physician in 4 months or sooner if needed.  - Basic Metabolic Panel - Glucose (CBG) - POCT glycosylated hemoglobin (Hb A1C) - insulin NPH-regular Human (NOVOLIN 70/30) (70-30) 100 UNIT/ML injection; Inject 21 Units into the skin 2 (two) times daily with a meal.  Dispense: 10 mL; Refill: 3 - rosuvastatin (CRESTOR) 10 MG tablet; Take 1 tablet (10 mg total) by mouth daily. To lower cholesterol  Dispense: 30 tablet; Refill: 3  2. Hyperlipidemia LDL goal <70: -Practice low-fat heart healthy diet and at least 150 minutes of moderate intensity exercise weekly as tolerated.  -Continue Rosuvastatin 10 mg daily as prescribed.  -Patient reports he does not plan to take Rosuvastatin because he does not feel the medication is helping with anything.  -Counseled patient that  Rosuvastatin is recommended to prevent cardiovascular events such as heart attack and/or stroke. Patient verbalized understanding and declined management at this time. -Last lipid panel obtained 12/25/2019. -Follow-up with primary physician in 4  months or sooner if needed.  - rosuvastatin (CRESTOR) 10 MG tablet; Take 1 tablet (10 mg total) by mouth daily. To lower cholesterol  Dispense: 30 tablet; Refill: 3  Patient was given the opportunity to ask questions.  Patient verbalized understanding of the plan and was able to repeat key elements of the plan. Patient was given clear instructions to go to Emergency Department or return to medical center if symptoms don't improve, worsen, or new problems develop.The patient verbalized understanding.   Rema Fendt, NP

## 2020-04-09 NOTE — Patient Instructions (Addendum)
Increase NPH 70/30 to 21 units twice daily for diabetes management. Continue Rosuvastatin for cholesterol. Lab today. Follow-up with clinical pharmacist in 1 month for diabetes check. Follow-up with primary physician in 3 months or sooner if needed. Lab today. Diabetes Basics  Diabetes (diabetes mellitus) is a long-term (chronic) disease. It occurs when the body does not properly use sugar (glucose) that is released from food after you eat. Diabetes may be caused by one or both of these problems:  Your pancreas does not make enough of a hormone called insulin.  Your body does not react in a normal way to insulin that it makes. Insulin lets sugars (glucose) go into cells in your body. This gives you energy. If you have diabetes, sugars cannot get into cells. This causes high blood sugar (hyperglycemia). Follow these instructions at home: How is diabetes treated? You may need to take insulin or other diabetes medicines daily to keep your blood sugar in balance. Take your diabetes medicines every day as told by your doctor. List your diabetes medicines here: Diabetes medicines  Name of medicine: ______________________________ ? Amount (dose): _______________ Time (a.m./p.m.): _______________ Notes: ___________________________________  Name of medicine: ______________________________ ? Amount (dose): _______________ Time (a.m./p.m.): _______________ Notes: ___________________________________  Name of medicine: ______________________________ ? Amount (dose): _______________ Time (a.m./p.m.): _______________ Notes: ___________________________________ If you use insulin, you will learn how to give yourself insulin by injection. You may need to adjust the amount based on the food that you eat. List the types of insulin you use here: Insulin  Insulin type: ______________________________ ? Amount (dose): _______________ Time (a.m./p.m.): _______________ Notes:  ___________________________________  Insulin type: ______________________________ ? Amount (dose): _______________ Time (a.m./p.m.): _______________ Notes: ___________________________________  Insulin type: ______________________________ ? Amount (dose): _______________ Time (a.m./p.m.): _______________ Notes: ___________________________________  Insulin type: ______________________________ ? Amount (dose): _______________ Time (a.m./p.m.): _______________ Notes: ___________________________________  Insulin type: ______________________________ ? Amount (dose): _______________ Time (a.m./p.m.): _______________ Notes: ___________________________________ How do I manage my blood sugar?  Check your blood sugar levels using a blood glucose monitor as directed by your doctor. Your doctor will set treatment goals for you. Generally, you should have these blood sugar levels:  Before meals (preprandial): 80-130 mg/dL (1.6-1.0 mmol/L).  After meals (postprandial): below 180 mg/dL (10 mmol/L).  A1c level: less than 7%. Write down the times that you will check your blood sugar levels: Blood sugar checks  Time: _______________ Notes: ___________________________________  Time: _______________ Notes: ___________________________________  Time: _______________ Notes: ___________________________________  Time: _______________ Notes: ___________________________________  Time: _______________ Notes: ___________________________________  Time: _______________ Notes: ___________________________________  What do I need to know about low blood sugar? Low blood sugar is called hypoglycemia. This is when blood sugar is at or below 70 mg/dL (3.9 mmol/L). Symptoms may include:  Feeling: ? Hungry. ? Worried or nervous (anxious). ? Sweaty and clammy. ? Confused. ? Dizzy. ? Sleepy. ? Sick to your stomach (nauseous).  Having: ? A fast heartbeat. ? A headache. ? A change in your  vision. ? Tingling or no feeling (numbness) around the mouth, lips, or tongue. ? Jerky movements that you cannot control (seizure).  Having trouble with: ? Moving (coordination). ? Sleeping. ? Passing out (fainting). ? Getting upset easily (irritability). Treating low blood sugar To treat low blood sugar, eat or drink something sugary right away. If you can think clearly and swallow safely, follow the 15:15 rule:  Take 15 grams of a fast-acting carb (carbohydrate). Talk with your doctor about how much you should take.  Some fast-acting carbs are: ? Sugar  tablets (glucose pills). Take 3-4 glucose pills. ? 6-8 pieces of hard candy. ? 4-6 oz (120-150 mL) of fruit juice. ? 4-6 oz (120-150 mL) of regular (not diet) soda. ? 1 Tbsp (15 mL) honey or sugar.  Check your blood sugar 15 minutes after you take the carb.  If your blood sugar is still at or below 70 mg/dL (3.9 mmol/L), take 15 grams of a carb again.  If your blood sugar does not go above 70 mg/dL (3.9 mmol/L) after 3 tries, get help right away.  After your blood sugar goes back to normal, eat a meal or a snack within 1 hour. Treating very low blood sugar If your blood sugar is at or below 54 mg/dL (3 mmol/L), you have very low blood sugar (severe hypoglycemia). This is an emergency. Do not wait to see if the symptoms will go away. Get medical help right away. Call your local emergency services (911 in the [place] Do not drive yourself to the hospital. Questions to ask your health care provider  Do I need to meet with a diabetes educator?  What equipment will I need to care for myself at home?  What diabetes medicines do I need? When should I take them?  How often do I need to check my blood sugar?  What number can I call if I have questions?  When is my next doctor's visit?  Where can I find a support group for people with diabetes? Where to find more information  American Diabetes Association:  www.diabetes.org  American Association of Diabetes Educators: www.diabeteseducator.org/patient-resources Contact a doctor if:  Your blood sugar is at or above 240 mg/dL (13.2 mmol/L) for 2 days in a row.  You have been sick or have had a fever for 2 days or more, and you are not getting better.  You have any of these problems for more than 6 hours: ? You cannot eat or drink. ? You feel sick to your stomach (nauseous). ? You throw up (vomit). ? You have watery poop (diarrhea). Get help right away if:  Your blood sugar is lower than 54 mg/dL (3 mmol/L).  You get confused.  You have trouble: ? Thinking clearly. ? Breathing. Summary  Diabetes (diabetes mellitus) is a long-term (chronic) disease. It occurs when the body does not properly use sugar (glucose) that is released from food after digestion.  Take insulin and diabetes medicines as told.  Check your blood sugar every day, as often as told.  Keep all follow-up visits as told by your doctor. This is important. This information is not intended to replace advice given to you by your health care provider. Make sure you discuss any questions you have with your health care provider. Document Revised: 05/29/2019 Document Reviewed: 12/08/2017 Elsevier Patient Education  2020 [employer].

## 2020-04-10 LAB — BASIC METABOLIC PANEL
BUN/Creatinine Ratio: 18 (ref 9–20)
BUN: 22 mg/dL (ref 6–24)
CO2: 27 mmol/L (ref 20–29)
Calcium: 10 mg/dL (ref 8.7–10.2)
Chloride: 101 mmol/L (ref 96–106)
Creatinine, Ser: 1.24 mg/dL (ref 0.76–1.27)
GFR calc Af Amer: 82 mL/min/{1.73_m2} (ref >59)
GFR calc non Af Amer: 71 mL/min/{1.73_m2} (ref >59)
Glucose: 241 mg/dL — ABNORMAL HIGH (ref 65–99)
Potassium: 4.3 mmol/L (ref 3.5–5.2)
Sodium: 140 mmol/L (ref 134–144)

## 2020-04-13 NOTE — Progress Notes (Signed)
Please call patient with update.   Kidney function normal.   CBG and hemoglobin A1C discussed in clinic.

## 2020-04-14 ENCOUNTER — Telehealth: Payer: Self-pay

## 2020-04-14 NOTE — Telephone Encounter (Signed)
Contacted pt to go over lab results pt wasn't home step brother will have pt call back tomorrow morning for results

## 2020-05-08 ENCOUNTER — Ambulatory Visit: Payer: Self-pay | Attending: Family Medicine | Admitting: Pharmacist

## 2020-05-08 ENCOUNTER — Other Ambulatory Visit: Payer: Self-pay

## 2020-05-08 NOTE — Progress Notes (Unsigned)
   S:  PCP: [facility]  No chief complaint on file.  Patient arrives in no acute distress.  Presents for diabetes evaluation, education, and management. Patient was last seen by NP on behalf of PCP and referred on 04/09/20. At that visit BG above goal, NPH 70/30 increased.  Pt was previously referred to Clinical Pharmacist, last seen on 12/25/19 and lost to follow up.  T2DM diagnosed ***.  Family/Social History:  - FHx:  - Tobacco:  - Alcohol:   Insurance coverage/medication affordability: self pay   Current diabetes medications:  1. NPH 70/30 21 units BID with meals  Adverse effects: Medication adherence: ***  Prior diabetes med trials: glipizide, lantus, novolin R, metformin (nausea, vomiting, fatigue)  Hypoglycemia: - ***Denies or endorses - Experiences at BG < *** - Treats with: *** - History of hypoglycemia on NPH, was switched to lantus in 2019 but pt insisted on going back to NPH instead of increasing lantus dose.  Lifestyle: Diet: *** Eats *** meals/day - Breakfast:*** - Lunch:*** - Dinner:*** - Snacks:*** - Drinks:***  Exercise: ***  DM Comorbidities: - Nocturia: - Neuropathy:  - last DFE - Retinopathy:  - last eye exam - Gastroparesis:   Current hypertension medications include: none Current hyperlipidemia medications include: *** rosuvastatin 10 mg daily  O:   POCT BG today:   Lab Results  Component Value Date   HGBA1C 12.3 (A) 04/09/2020   There were no vitals filed for this visit.  Home BG Readings FBG: *** 2 hour post-meal/random: ***   Lipid Panel     Component Value Date/Time   CHOL 195 12/25/2019 1505   TRIG 148 12/25/2019 1505   HDL 49 12/25/2019 1505   CHOLHDL 4.0 12/25/2019 1505   CHOLHDL 4.0 04/09/2014 1106   VLDL 18 04/09/2014 1106   LDLCALC 120 (H) 12/25/2019 1505    Clinical Atherosclerotic Cardiovascular Disease (ASCVD): No  The 10-year ASCVD risk score Denman George DC Jr., et al., 2013) is: 8.4%   Values used to calculate the  score:     Age: 44 years     Sex: Male     Is Non-Hispanic African American: Yes     Diabetic: Yes     Tobacco smoker: No     Systolic Blood Pressure: 143 mmHg     Is BP treated: No     HDL Cholesterol: 49 mg/dL     Total Cholesterol: 195 mg/dL    A: 1. Diabetes - Most recent A1c not at goal < 7%, next due 07/10/20 - Clinic BG today *** at goal of *** - Home BG readings *** at goal - Hypoglycemia: *** - Adherence: *** - ***Discussed mechanism of action, adverse effects, etc - ***Assessment  - ***Lifestyle - ***Education - Future considerations: ***  2. ASCVD: primary prevention - Last LDL above goal - Pt prescribed moderate intensity statin but not taking. Would benefit from higher intensity. Pt offered by NP at prior visit and declined - Aspirin is not indicated   P: -   - F/U Pharmacist/PCP*** Clinic Visit in ***.   Patient seen with *** Written patient instructions provided.  Total time in face to face counseling *** minutes.

## 2020-06-15 MED FILL — NOVOLIN 70/30 100 UNITS/ML: (70-30) 100 | 23 days supply | Qty: 10 | Fill #0

## 2020-06-16 ENCOUNTER — Other Ambulatory Visit: Payer: Self-pay | Admitting: Pharmacist

## 2020-06-16 MED ORDER — "INSULIN SYRINGE-NEEDLE U-100 30G X 5/16"" 0.5 ML MISC": 2 refills | Status: DC

## 2020-06-16 MED FILL — TECHLITE INSULIN SYRINGE 31: 31G X 5/16" | 50 days supply | Qty: 100 | Fill #0

## 2020-06-19 ENCOUNTER — Ambulatory Visit: Payer: Self-pay | Admitting: Pharmacist

## 2020-06-19 NOTE — Progress Notes (Deleted)
    S:    PCP: Dr. Jillyn Hidden  No chief complaint on file.  Patient arrives in *** spirits. Presents for diabetes management at the request of Dr. Jillyn Hidden. Patient was referred on 12/11/19.  Patient has a history of non-compliance. I have seen him before and he is reluctant to increase his dose secondary to relative hypoglycemia.   Family/Social History:  - FHx: DM (3 siblings) - Tobacco: 1 cigarette q2weeks - Alcohol: denies  Insurance coverage/medication affordability:  - Self-pay  Patient *** adherence with medications.  Current diabetes medications include:  - Novolin 70/30 21 units BID  Patient *** hypoglycemic events.   Patient reported dietary habits:  - ***  Patient-reported exercise habits:  - ***    Patient *** polyuria. *** polyphagia or polydipsia  Patient *** occasional neuropathy. Patient denies visual changes.  O:  POCT: *** Home glucose levels: ***  Lab Results  Component Value Date   HGBA1C 12.3 (A) 04/09/2020   There were no vitals filed for this visit.  Lipid Panel     Component Value Date/Time   CHOL 195 12/25/2019 1505   TRIG 148 12/25/2019 1505   HDL 49 12/25/2019 1505   CHOLHDL 4.0 12/25/2019 1505   CHOLHDL 4.0 04/09/2014 1106   VLDL 18 04/09/2014 1106   LDLCALC 120 (H) 12/25/2019 1505   Clinical ASCVD: No   A/P: Diabetes longstanding currently uncontrolled. Patient is able to verbalize appropriate hypoglycemia management plan. Patient is adherent with medication.   -Inc dose of Novolin 70/30 to 18 units BID.  -Extensively discussed pathophysiology of DM, recommended lifestyle interventions, dietary effects on glycemic control -Counseled on s/sx of and management of hypoglycemia -Next A1C anticipated 02/2020.   Written patient instructions provided.  Total time in face to face counseling *** minutes.   Follow up ***.    Butch Penny, PharmD, CPP Clinical Pharmacist [facility] [phone]

## 2020-07-08 ENCOUNTER — Other Ambulatory Visit: Payer: Self-pay | Admitting: Family Medicine

## 2020-07-08 ENCOUNTER — Encounter: Payer: Self-pay | Admitting: Family Medicine

## 2020-07-08 ENCOUNTER — Ambulatory Visit: Payer: Self-pay | Attending: Family Medicine | Admitting: Family Medicine

## 2020-07-08 ENCOUNTER — Other Ambulatory Visit: Payer: Self-pay

## 2020-07-08 VITALS — BP 106/71 | HR 92 | Ht 72.0 in | Wt 138.8 lb

## 2020-07-08 DIAGNOSIS — Z794 Long term (current) use of insulin: Secondary | ICD-10-CM

## 2020-07-08 DIAGNOSIS — E1165 Type 2 diabetes mellitus with hyperglycemia: Secondary | ICD-10-CM

## 2020-07-08 DIAGNOSIS — E785 Hyperlipidemia, unspecified: Secondary | ICD-10-CM

## 2020-07-08 LAB — POCT GLYCOSYLATED HEMOGLOBIN (HGB A1C): HbA1c, POC (controlled diabetic range): 12.8 % — AB (ref 0.0–7.0)

## 2020-07-08 LAB — GLUCOSE, POCT (MANUAL RESULT ENTRY): POC Glucose: 282 mg/dl — AB (ref 70–99)

## 2020-07-08 MED ORDER — NOVOLIN 70/30 (70-30) 100 UNIT/ML ~~LOC~~ SUSP: 18.0000 [IU] | Freq: Two times a day (BID) | SUBCUTANEOUS | 3 refills | Status: DC

## 2020-07-08 MED ORDER — ROSUVASTATIN CALCIUM 10 MG PO TABS: 10.0000 mg | ORAL_TABLET | Freq: Every day | ORAL | 3 refills | Status: DC

## 2020-07-08 MED ORDER — "INSULIN SYRINGE-NEEDLE U-100 30G X 5/16"" 0.5 ML MISC": 2 refills | Status: DC

## 2020-07-08 MED FILL — ROSUVASTATIN CALCIUM 10 MG: 10 | 30 days supply | Qty: 30 | Fill #0

## 2020-07-08 MED FILL — BD INSULIN SYR 0.5 ML 8MMX3: 31G X 5/16" | 50 days supply | Qty: 100 | Fill #0

## 2020-07-08 NOTE — Patient Instructions (Signed)

## 2020-07-08 NOTE — Progress Notes (Signed)
Subjective:  Patient ID: [name], male    DOB: 08-28-1976  Age: 44 y.o. MRN: [id number]  CC: Diabetes   HPI [name] is a 44 year old male patient of Dr. Jillyn Hidden with a history of type 2 diabetes mellitus (A1c 12.8), hyperlipidemia seen for an office visit today.  Fasting and random sugars range150-250 and he denies excessively high sugars.  Administers 15- 20 units twice daily depending on his sugars and when asked about why he chooses a range of insulin to administer he states the prescribed insulin regimen causes him to have hypoglycemia to the point where he has had a 46. He has not been compliant with his statin and states he is "healthy" and does not need it. He has no chest pain or dyspnea and has no paresthesia in his extremities, no visual concerns.  He is noncompliant with a diabetic diet or exercise. Past Medical History:  Diagnosis Date  . Diabetes mellitus    Newly diagnosed, May 2012    Past Surgical History:  Procedure Laterality Date  . APPENDECTOMY    . TONSILLECTOMY      Family History  Problem Relation Age of Onset  . Hypertension Mother   . Diabetes Sister        Several brothers and sister with DM  . Diabetes Brother     No Known Allergies  Outpatient Medications Prior to Visit  Medication Sig Dispense Refill  . Insulin Syringe-Needle U-100 (TRUEPLUS INSULIN SYRINGE) 30G X 5/16" 0.5 ML MISC Use as instructed to inject insulin twice daily. 100 each 2  . menthol-cetylpyridinium (CEPACOL) 3 MG lozenge Take 1 lozenge (3 mg total) by mouth as needed for sore throat. (Patient not taking: Reported on 07/12/2018) 100 tablet 12  . pantoprazole (PROTONIX) 40 MG tablet Take 1 tablet (40 mg total) by mouth daily. (Patient not taking: Reported on 07/08/2020) 30 tablet 4  . phenol (CHLORASEPTIC) 1.4 % LIQD Use as directed 1 spray in the mouth or throat as needed for throat irritation / pain. (Patient not taking: Reported on 07/12/2018) 1 Bottle 0  .  insulin NPH-regular Human (NOVOLIN 70/30) (70-30) 100 UNIT/ML injection Inject 21 Units into the skin 2 (two) times daily with a meal. 10 mL 3  . rosuvastatin (CRESTOR) 10 MG tablet Take 1 tablet (10 mg total) by mouth daily. To lower cholesterol (Patient not taking: Reported on 07/08/2020) 30 tablet 3   No facility-administered medications prior to visit.     ROS Review of Systems  Constitutional: Negative for activity change and appetite change.  HENT: Negative for sinus pressure and sore throat.   Eyes: Negative for visual disturbance.  Respiratory: Negative for cough, chest tightness and shortness of breath.   Cardiovascular: Negative for chest pain and leg swelling.  Gastrointestinal: Negative for abdominal distention, abdominal pain, constipation and diarrhea.  Endocrine: Negative.   Genitourinary: Negative for dysuria.  Musculoskeletal: Negative for joint swelling and myalgias.  Skin: Negative for rash.  Allergic/Immunologic: Negative.   Neurological: Negative for weakness, light-headedness and numbness.  Psychiatric/Behavioral: Negative for dysphoric mood and suicidal ideas.    Objective:  BP 106/71   Pulse 92   Ht 6' (1.829 m)   Wt 138 lb 12.8 oz (63 kg)   SpO2 100%   BMI 18.82 kg/m   BP/Weight 07/08/2020 04/09/2020 12/11/2019  Systolic BP 106 143 120  Diastolic BP 71 91 77  Wt. (Lbs) 138.8 144.4 142.8  BMI 18.82 19.58 19.37  Physical Exam Constitutional:      Appearance: He is well-developed.  Neck:     Vascular: No JVD.  Cardiovascular:     Rate and Rhythm: Normal rate.     Heart sounds: Normal heart sounds. No murmur heard.   Pulmonary:     Effort: Pulmonary effort is normal.     Breath sounds: Normal breath sounds. No wheezing or rales.  Chest:     Chest wall: No tenderness.  Abdominal:     General: Bowel sounds are normal. There is no distension.     Palpations: Abdomen is soft. There is no mass.     Tenderness: There is no abdominal  tenderness.  Musculoskeletal:        General: Normal range of motion.     Right lower leg: No edema.     Left lower leg: No edema.  Neurological:     Mental Status: He is alert and oriented to person, place, and time.  Psychiatric:        Mood and Affect: Mood normal.     CMP Latest Ref Rng & Units 04/09/2020 12/25/2019 07/27/2018  Glucose 65 - 99 mg/dL 626(R) 485(I) 627(O)  BUN 6 - 24 mg/dL 22 18 16   Creatinine 0.76 - 1.27 mg/dL 3.50) 0.93(G  Sodium 134 - 144 mmol/L 140 139 141  Potassium 3.5 - 5.2 mmol/L 4.3 4.0 4.7  Chloride 96 - 106 mmol/L 101 97 101  CO2 20 - 29 mmol/L 27 26 25   Calcium 8.7 - 10.2 mg/dL 1.82 9.7 9.4  Total Protein 6.0 - 8.5 g/dL - 6.9 7.2  Total Bilirubin 0.0 - 1.2 mg/dL - 0.4 0.4  Alkaline Phos 39 - 117 IU/L - 126(H) 96  AST 0 - 40 IU/L - 21 17  ALT 0 - 44 IU/L - 57(H) 24    Lipid Panel     Component Value Date/Time   CHOL 195 12/25/2019 1505   TRIG 148 12/25/2019 1505   HDL 49 12/25/2019 1505   CHOLHDL 4.0 12/25/2019 1505   CHOLHDL 4.0 04/09/2014 1106   VLDL 18 04/09/2014 1106   LDLCALC 120 (H) 12/25/2019 1505    CBC    Component Value Date/Time   WBC 9.5 03/25/2018 0438   RBC 4.26 03/25/2018 0438   HGB 10.4 (L) 03/25/2018 0438   HCT 33.6 (L) 03/25/2018 0438   PLT 192 03/25/2018 0438   MCV 78.9 03/25/2018 0438   MCH 24.4 (L) 03/25/2018 0438   MCHC 31.0 03/25/2018 0438   RDW 13.9 03/25/2018 0438   LYMPHSABS 2.3 03/25/2018 0438   MONOABS 0.8 03/25/2018 0438   EOSABS 0.0 03/25/2018 0438   BASOSABS 0.0 03/25/2018 0438    Lab Results  Component Value Date   HGBA1C 12.8 (A) 07/08/2020    Assessment & Plan:  1. Type 2 diabetes mellitus with hyperglycemia, with long-term current use of insulin (HCC) Uncontrolled with A1c of 12.8 due to noncompliance; goal is less than 7.0 Advised to start with 18 units of insulin twice daily and bring in his blood sugar log for review at his next visit after which is Novolin 70/30 dose will be  adjusted according to his blood sugars Counseled on Diabetic diet, my plate method, 07/10/2020 minutes of moderate intensity exercise/week Blood sugar logs with fasting goals of 80-120 mg/dl, random of less than 11-07-1970 and in the event of sugars less than 60 mg/dl or greater than 716 mg/dl encouraged to notify the clinic. Advised on the need for annual  eye exams, annual foot exams, Pneumonia vaccine. - POCT glucose (manual entry) - POCT glycosylated hemoglobin (Hb A1C) - Insulin Syringe-Needle U-100 (TRUEPLUS INSULIN SYRINGE) 30G X 5/16" 0.5 ML MISC; Use as instructed to inject insulin twice daily.  Dispense: 100 each; Refill: 2 - insulin NPH-regular Human (NOVOLIN 70/30) (70-30) 100 UNIT/ML injection; Inject 18 Units into the skin 2 (two) times daily with a meal.  Dispense: 10 mL; Refill: 3 - rosuvastatin (CRESTOR) 10 MG tablet; Take 1 tablet (10 mg total) by mouth daily. To lower cholesterol  Dispense: 30 tablet; Refill: 3  2. Hyperlipidemia LDL goal <70 Noncompliant Advised of cardiovascular benefits of statin in patients above 40 Low-cholesterol diet - rosuvastatin (CRESTOR) 10 MG tablet; Take 1 tablet (10 mg total) by mouth daily. To lower cholesterol  Dispense: 30 tablet; Refill: 3   Meds ordered this encounter  Medications  . Insulin Syringe-Needle U-100 (TRUEPLUS INSULIN SYRINGE) 30G X 5/16" 0.5 ML MISC    Sig: Use as instructed to inject insulin twice daily.    Dispense:  100 each    Refill:  2  . insulin NPH-regular Human (NOVOLIN 70/30) (70-30) 100 UNIT/ML injection    Sig: Inject 18 Units into the skin 2 (two) times daily with a meal.    Dispense:  10 mL    Refill:  3    Needs 30 day supply and syringles/needles  . rosuvastatin (CRESTOR) 10 MG tablet    Sig: Take 1 tablet (10 mg total) by mouth daily. To lower cholesterol    Dispense:  30 tablet    Refill:  3    Follow-up: Return in about 1 month (around 08/08/2020) for Dr Jillyn Hidden- Diabetes.       Hoy Register, MD,  FAAFP. [facility] [place], [place] [phone]   07/08/2020, 3:48 PM

## 2020-07-23 MED FILL — NOVOLIN 70/30 100 UNITS/ML: (70-30) 100 | 27 days supply | Qty: 10 | Fill #0

## 2020-08-07 ENCOUNTER — Ambulatory Visit: Payer: Self-pay | Admitting: Family Medicine

## 2020-08-24 MED FILL — NOVOLIN 70/30 100 UNITS/ML: (70-30) 100 | 27 days supply | Qty: 10 | Fill #0

## 2020-08-27 ENCOUNTER — Ambulatory Visit: Payer: Self-pay | Attending: Family Medicine | Admitting: Physician Assistant

## 2020-08-27 ENCOUNTER — Other Ambulatory Visit: Payer: Self-pay

## 2020-08-27 VITALS — BP 131/85 | HR 87 | Temp 98.7°F | Resp 18 | Ht 72.0 in | Wt 146.0 lb

## 2020-08-27 DIAGNOSIS — E1140 Type 2 diabetes mellitus with diabetic neuropathy, unspecified: Secondary | ICD-10-CM | POA: Insufficient documentation

## 2020-08-27 DIAGNOSIS — Z794 Long term (current) use of insulin: Secondary | ICD-10-CM

## 2020-08-27 DIAGNOSIS — E0849 Diabetes mellitus due to underlying condition with other diabetic neurological complication: Secondary | ICD-10-CM

## 2020-08-27 DIAGNOSIS — E1165 Type 2 diabetes mellitus with hyperglycemia: Secondary | ICD-10-CM

## 2020-08-27 DIAGNOSIS — E785 Hyperlipidemia, unspecified: Secondary | ICD-10-CM | POA: Insufficient documentation

## 2020-08-27 LAB — GLUCOSE, POCT (MANUAL RESULT ENTRY): POC Glucose: 252 mg/dl — AB (ref 70–99)

## 2020-08-27 NOTE — Progress Notes (Signed)
Established Patient Office Visit  Subjective:  Patient ID: [name], male    DOB: [birth year]  Age: 44 y.o. MRN: [id number]  CC:  Chief Complaint  Patient presents with  . Follow-up    HPI [name]  states that his blood glucose readings have ranged between 160 and 230, states closer to 160 for fasting readings, denies any hypoglycemic readings.  Did not bring his blood glucose log with him, does endorse that he has been using 18 units of insulin twice a day  Reports that he has had intermittent numbness and tingling in both feet, states that this has been going on for the last couple of months.  Reports that he did not resume the cholesterol medication    Past Medical History:  Diagnosis Date  . Diabetes mellitus    Newly diagnosed, May 2012    Past Surgical History:  Procedure Laterality Date  . APPENDECTOMY    . TONSILLECTOMY      Family History  Problem Relation Age of Onset  . Hypertension Mother   . Diabetes Sister        Several brothers and sister with DM  . Diabetes Brother     Social History   Socioeconomic History  . Marital status: Single    Spouse name: Not on file  . Number of children: Not on file  . Years of education: Not on file  . Highest education level: Not on file  Occupational History  . Not on file  Tobacco Use  . Smoking status: Former Smoker    Quit date: 10/20/2015    Years since quitting: 4.8  . Smokeless tobacco: Former Neurosurgeon    Quit date: 10/20/2015  . Tobacco comment: Smoking 1 cigs every 4 days  Substance and Sexual Activity  . Alcohol use: No    Alcohol/week: 0.0 standard drinks  . Drug use: Yes    Types: Marijuana    Comment: last use 2-3 days ago  . Sexual activity: Not on file  Other Topics Concern  . Not on file  Social History Narrative   Currently lives with his parents in [place]. Works at [employer]. Denies any current alcohol, tobacco or illicit drug use.         Social Determinants of  Health   Financial Resource Strain: Not on file  Food Insecurity: Not on file  Transportation Needs: Not on file  Physical Activity: Not on file  Stress: Not on file  Social Connections: Not on file  Intimate Partner Violence: Not on file    Outpatient Medications Prior to Visit  Medication Sig Dispense Refill  . insulin NPH-regular Human (NOVOLIN 70/30) (70-30) 100 UNIT/ML injection Inject 18 Units into the skin 2 (two) times daily with a meal. 10 mL 3  . Insulin Syringe-Needle U-100 (TRUEPLUS INSULIN SYRINGE) 30G X 5/16" 0.5 ML MISC Use as instructed to inject insulin twice daily. 100 each 2  . menthol-cetylpyridinium (CEPACOL) 3 MG lozenge Take 1 lozenge (3 mg total) by mouth as needed for sore throat. (Patient not taking: No sig reported) 100 tablet 12  . pantoprazole (PROTONIX) 40 MG tablet Take 1 tablet (40 mg total) by mouth daily. (Patient not taking: No sig reported) 30 tablet 4  . phenol (CHLORASEPTIC) 1.4 % LIQD Use as directed 1 spray in the mouth or throat as needed for throat irritation / pain. (Patient not taking: No sig reported) 1 Bottle 0  . rosuvastatin (CRESTOR) 10 MG tablet Take 1  tablet (10 mg total) by mouth daily. To lower cholesterol (Patient not taking: Reported on 08/27/2020) 30 tablet 3   No facility-administered medications prior to visit.    No Known Allergies  ROS Review of Systems  Constitutional: Negative.   HENT: Negative.   Eyes: Negative.   Respiratory: Negative.   Cardiovascular: Negative.   Gastrointestinal: Negative.   Endocrine: Negative.   Genitourinary: Negative.   Musculoskeletal: Negative.   Skin: Negative for color change.  Allergic/Immunologic: Negative.   Neurological: Negative.   Hematological: Negative.   Psychiatric/Behavioral: Negative.       Objective:    Physical Exam Vitals and nursing note reviewed.   BP 131/85 (BP Location: Left Arm, Patient Position: Sitting, Cuff Size: Normal)   Pulse 87   Temp 98.7 F (37.1  C) (Oral)   Resp 18   Ht 6' (1.829 m)   Wt 146 lb (66.2 kg)   SpO2 100%   BMI 19.80 kg/m   General Appearance:    Alert, cooperative, no distress, appears stated age  Head:    Normocephalic, without obvious abnormality, atraumatic  Eyes:    PERRL, conjunctiva/corneas clear, EOM's intact, fundi    benign, both eyes       Ears:    Normal TM's and external ear canals, both ears  Nose:   Nares normal, septum midline, mucosa normal, no drainage   or sinus tenderness  Throat:   Lips, mucosa, and tongue normal; teeth and gums normal  Neck:   Supple, symmetrical, trachea midline, no adenopathy;       thyroid:  No enlargement/tenderness/nodules; no carotid   bruit or JVD  Back:     Symmetric, no curvature, ROM normal, no CVA tenderness  Lungs:     Clear to auscultation bilaterally, respirations unlabored  Chest wall:    No tenderness or deformity  Heart:    Regular rate and rhythm, S1 and S2 normal, no murmur, rub   or gallop           Extremities:   Extremities normal, atraumatic, no cyanosis or edema  Pulses:   2+ and symmetric all extremities  Skin:   Skin color, texture, turgor normal, no rashes or lesions  Lymph nodes:   Cervical, supraclavicular, and axillary nodes normal  Neurologic:   CNII-XII intact. Normal strength, sensation and reflexes      throughout     BP 131/85 (BP Location: Left Arm, Patient Position: Sitting, Cuff Size: Normal)   Pulse 87   Temp 98.7 F (37.1 C) (Oral)   Resp 18   Ht 6' (1.829 m)   Wt 146 lb (66.2 kg)   SpO2 100%   BMI 19.80 kg/m  Wt Readings from Last 3 Encounters:  08/27/20 146 lb (66.2 kg)  07/08/20 138 lb 12.8 oz (63 kg)  04/09/20 144 lb 6.4 oz (65.5 kg)     Health Maintenance Due  Topic Date Due  . Hepatitis C Screening  Never done  . COVID-19 Vaccine (1) Never done  . FOOT EXAM  Never done  . OPHTHALMOLOGY EXAM  Never done  . URINE MICROALBUMIN  Never done    There are no preventive care reminders to display for this  patient.  Lab Results  Component Value Date   TSH 1.25 01/26/2016   Lab Results  Component Value Date   WBC 9.5 03/25/2018   HGB 10.4 (L) 03/25/2018   HCT 33.6 (L) 03/25/2018   MCV 78.9 03/25/2018   PLT 192 03/25/2018  Lab Results  Component Value Date   NA 140 04/09/2020   K 4.3 04/09/2020   CO2 27 04/09/2020   GLUCOSE 241 (H) 04/09/2020   BUN 22 04/09/2020   CREATININE 1.24 04/09/2020   BILITOT 0.4 12/25/2019   ALKPHOS 126 (H) 12/25/2019   AST 21 12/25/2019   ALT 57 (H) 12/25/2019   PROT 6.9 12/25/2019   ALBUMIN 4.3 12/25/2019   CALCIUM 10.0 04/09/2020   ANIONGAP 11 03/25/2018   Lab Results  Component Value Date   CHOL 195 12/25/2019   Lab Results  Component Value Date   HDL 49 12/25/2019   Lab Results  Component Value Date   LDLCALC 120 (H) 12/25/2019   Lab Results  Component Value Date   TRIG 148 12/25/2019   Lab Results  Component Value Date   CHOLHDL 4.0 12/25/2019   Lab Results  Component Value Date   HGBA1C 12.8 (A) 07/08/2020      Assessment & Plan:   Problem List Items Addressed This Visit      Endocrine   Diabetic neuropathy (HCC)   Type 2 diabetes mellitus with hyperglycemia, with long-term current use of insulin (HCC) - Primary   Relevant Orders   Glucose (CBG) (Completed)     Other   Hyperlipidemia LDL goal <70    1. Type 2 diabetes mellitus with hyperglycemia, with long-term current use of insulin (HCC) Strongly encouraged patient to continue checking blood glucose level several times a day, bring written copy to next office visit, continue to follow a diabetic diet - Glucose (CBG)  2. Hyperlipidemia LDL goal <70 Patient continues to refuse pharmaceutical treatment  3. Other diabetic neurological complication associated with diabetes mellitus due to underlying condition [facility]) On further review of chart, patient has previously been prescribed gabapentin in 2016 for same condition.  Patient refuses trial of gabapentin  today   I have reviewed the patient's medical history (PMH, PSH, Social History, Family History, Medications, and allergies) , and have been updated if relevant. I spent 20 minutes reviewing chart and  face to face time with patient.      No orders of the defined types were placed in this encounter.   Follow-up: Return in about 2 months (around 10/28/2020).    Kasandra Knudsen Mayers, PA-C

## 2020-08-27 NOTE — Progress Notes (Signed)
Patient has eaten today. Patient has taken medication today. Patient has pain in the feet related to neuropathy.

## 2020-08-27 NOTE — Patient Instructions (Signed)
I encourage you to continue checking your blood sugar levels, and bring your written log with you to your next office visit.  Continue working on a low sugar diabetic diet and checking your feet on a daily basis  Roney Jaffe, PA-C Physician Assistant [facility] [email or web address]     Diabetic Neuropathy Diabetic neuropathy refers to nerve damage that is caused by diabetes (diabetes mellitus). Over time, people with diabetes can develop nerve damage throughout the body. There are several types of diabetic neuropathy:  Peripheral neuropathy. This is the most common type of diabetic neuropathy. It causes damage to nerves that carry signals between the spinal cord and other parts of the body (peripheral nerves). This usually affects nerves in the feet and legs first, and may eventually affect the hands and arms. The damage affects the ability to sense touch or temperature.  Autonomic neuropathy. This type causes damage to nerves that control involuntary functions (autonomic nerves). These nerves carry signals that control: ? Heartbeat. ? Body temperature. ? Blood pressure. ? Urination. ? Digestion. ? Sweating. ? Sexual function. ? Response to changing blood sugar (glucose) levels.  Focal neuropathy. This type of nerve damage affects one area of the body, such as an arm, a leg, or the face. The injury may involve one nerve or a small group of nerves. Focal neuropathy can be painful and unpredictable, and occurs most often in older adults with diabetes. This often develops suddenly, but usually improves over time and does not cause long-term problems.  Proximal neuropathy. This type of nerve damage affects the nerves of the thighs, hips, buttocks, or legs. It causes severe pain, weakness, and muscle death (atrophy), usually in the thigh muscles. It is more common among older men and people who have type 2 diabetes. The length of  recovery time may vary. What are the causes? Peripheral, autonomic, and focal neuropathies are caused by diabetes that is not well controlled with treatment. The cause of proximal neuropathy is not known, but it may be caused by inflammation related to uncontrolled blood glucose levels. What are the signs or symptoms? Peripheral neuropathy Peripheral neuropathy develops slowly over time. When the nerves of the feet and legs no longer work, you may experience:  Burning, stabbing, or aching pain in the legs or feet.  Pain or cramping in the legs or feet.  Loss of feeling (numbness) and inability to feel pressure or pain in the feet. This can lead to: ? Thick calluses or sores on areas of constant pressure. ? Ulcers. ? Reduced ability to feel temperature changes.  Foot deformities.  Muscle weakness.  Loss of balance or coordination. Autonomic neuropathy The symptoms of autonomic neuropathy vary depending on which nerves are affected. Symptoms may include:  Problems with digestion, such as: ? Nausea or vomiting. ? Poor appetite. ? Bloating. ? Diarrhea or constipation. ? Trouble swallowing. ? Losing weight without trying to.  Problems with the heart, blood and lungs, such as: ? Dizziness, especially when standing up. ? Fainting. ? Shortness of breath. ? Irregular heartbeat.  Bladder problems, such as: ? Trouble starting or stopping urination. ? Leaking urine. ? Trouble emptying the bladder. ? Urinary tract infections (UTIs).  Problems with other body functions, such as: ? Sweat. You may sweat too much or too little. ? Temperature. You might get hot easily. Or, you might feel cold more than usual. ? Sexual function. Men may not be able to get or maintain an erection. Women may have vaginal dryness  and difficulty with arousal. Focal neuropathy Symptoms affect only one area of the body. Common symptoms include:  Numbness.  Tingling.  Burning pain.  Prickling  feeling.  Very sensitive skin.  Weakness.  Inability to move (paralysis).  Muscle twitching.  Muscles getting smaller (wasting).  Poor coordination.  Double or blurred vision. Proximal neuropathy  Sudden, severe pain in the hip, thigh, or buttocks. Pain may spread from the back into the legs (sciatica).  Pain and numbness in the arms and legs.  Tingling.  Loss of bladder control or bowel control.  Weakness and wasting of thigh muscles.  Difficulty getting up from a seated position.  Abdominal swelling.  Unexplained weight loss. How is this diagnosed? Diagnosis usually involves reviewing your medical history and any symptoms you have. Diagnosis varies depending on the type of neuropathy your health care provider suspects. Peripheral neuropathy Your health care provider will check areas that are affected by your nervous system (neurologic exam), such as your reflexes, how you move, and what you can feel. You may have other tests, such as:  Blood tests.  Removal and examination of fluid that surrounds the spinal cord (lumbar puncture).  CT scan.  MRI.  A test to check the nerves that control muscles (electromyogram, EMG).  Tests of how quickly messages pass through your nerves (nerve conduction velocity tests).  Removal of a small piece of nerve to be examined under a microscope (biopsy). Autonomic neuropathy You may have tests, such as:  Tests to measure your blood pressure and heart rate. This may include monitoring you while you are safely secured to an exam table that moves you from a lying position to an upright position (table tilt test).  Breathing tests to check your lungs.  Tests to check how food moves through the digestive system (gastric emptying tests).  Blood, sweat, or urine tests.  Ultrasound of your bladder.  Spinal fluid tests. Focal neuropathy This condition may be diagnosed with:  A neurologic exam.  CT  scan.  MRI.  EMG.  Nerve conduction velocity tests. Proximal neuropathy There is no test to diagnose this type of neuropathy. You may have tests to rule out other possible causes of this type of neuropathy. Tests may include:  X-rays of your spine and lumbar region.  Lumbar puncture.  MRI. How is this treated? The goal of treatment is to keep nerve damage from getting worse. The most important part of treatment is keeping your blood glucose level and your A1C level within your target range by following your diabetes management plan. Over time, maintaining lower blood glucose levels helps lessen symptoms. In some cases, you may need prescription pain medicine. Follow these instructions at home:  Lifestyle   Do not use any products that contain nicotine or tobacco, such as cigarettes and e-cigarettes. If you need help quitting, ask your health care provider.  Be physically active every day. Include strength training and balance exercises.  Follow a healthy meal plan.  Work with your health care provider to manage your blood pressure. General instructions  Follow your diabetes management plan as directed. ? Check your blood glucose levels as directed by your health care provider. ? Keep your blood glucose in your target range as directed by your health care provider. ? Have your A1C level checked at least two times a year, or as often as told by your health care provider.  Take over the counter and prescription medicines only as told by your health care provider. This includes  insulin and diabetes medicine.  Do not drive or use heavy machinery while taking prescription pain medicines.  Check your skin and feet every day for cuts, bruises, redness, blisters, or sores.  Keep all follow up visits as told by your health care provider. This is important. Contact a health care provider if:  You have burning, stabbing, or aching pain in your legs or feet.  You are unable to feel  pressure or pain in your feet.  You develop problems with digestion, such as: ? Nausea. ? Vomiting. ? Bloating. ? Constipation. ? Diarrhea. ? Abdominal pain.  You have difficulty with urination, such as inability: ? To control when you urinate (incontinence). ? To completely empty the bladder (retention).  You have palpitations.  You feel dizzy, weak, or faint when you stand up. Get help right away if:  You cannot urinate.  You have sudden weakness or loss of coordination.  You have trouble speaking.  You have pain or pressure in your chest.  You have an irregular heart beat.  You have sudden inability to move a part of your body. Summary  Diabetic neuropathy refers to nerve damage that is caused by diabetes. It can affect nerves throughout the entire body, causing numbness and pain in the arms, legs, digestive tract, heart, and other body systems.  Keep your blood glucose level and your blood pressure in your target range, as directed by your health care provider. This can help prevent neuropathy from getting worse.  Check your skin and feet every day for cuts, bruises, redness, blisters, or sores.  Do not use any products that contain nicotine or tobacco, such as cigarettes and e-cigarettes. If you need help quitting, ask your health care provider. This information is not intended to replace advice given to you by your health care provider. Make sure you discuss any questions you have with your health care provider. Document Revised: 10/18/2017 Document Reviewed: 10/10/2016 Elsevier Patient Education  2020 [employer].

## 2020-08-30 ENCOUNTER — Encounter: Payer: Self-pay | Admitting: Physician Assistant

## 2020-10-28 ENCOUNTER — Ambulatory Visit: Payer: Self-pay | Admitting: Family Medicine

## 2020-12-28 ENCOUNTER — Ambulatory Visit: Payer: Self-pay | Admitting: Family Medicine

## 2021-01-27 ENCOUNTER — Encounter: Payer: Self-pay | Admitting: Family Medicine

## 2021-01-27 ENCOUNTER — Other Ambulatory Visit: Payer: Self-pay | Admitting: Family Medicine

## 2021-01-27 ENCOUNTER — Other Ambulatory Visit: Payer: Self-pay

## 2021-01-27 ENCOUNTER — Ambulatory Visit: Payer: Self-pay | Attending: Family Medicine | Admitting: Family Medicine

## 2021-01-27 VITALS — BP 101/66 | HR 88 | Ht 72.0 in | Wt 136.2 lb

## 2021-01-27 DIAGNOSIS — E1165 Type 2 diabetes mellitus with hyperglycemia: Secondary | ICD-10-CM

## 2021-01-27 DIAGNOSIS — Z794 Long term (current) use of insulin: Secondary | ICD-10-CM

## 2021-01-27 DIAGNOSIS — Z1159 Encounter for screening for other viral diseases: Secondary | ICD-10-CM

## 2021-01-27 DIAGNOSIS — E785 Hyperlipidemia, unspecified: Secondary | ICD-10-CM

## 2021-01-27 LAB — POCT GLYCOSYLATED HEMOGLOBIN (HGB A1C): HbA1c, POC (controlled diabetic range): 12.8 % — AB (ref 0.0–7.0)

## 2021-01-27 LAB — GLUCOSE, POCT (MANUAL RESULT ENTRY): POC Glucose: 367 mg/dl — AB (ref 70–99)

## 2021-01-27 MED ORDER — ROSUVASTATIN CALCIUM 10 MG PO TABS
10.0000 mg | ORAL_TABLET | Freq: Every day | ORAL | 6 refills | Status: DC
  Filled 2021-01-27: qty 30, 30d supply, fill #0

## 2021-01-27 MED ORDER — DAPAGLIFLOZIN PROPANEDIOL 10 MG PO TABS
10.0000 mg | ORAL_TABLET | Freq: Every day | ORAL | 6 refills | Status: DC
Start: 2021-01-27 — End: 2021-04-24
  Filled 2021-01-27: qty 30, 30d supply, fill #0

## 2021-01-27 NOTE — Patient Instructions (Signed)
Dapagliflozin tablets What is this medicine? DAPAGLIFLOZIN (DAP a gli FLOE zin) controls blood sugar in people with diabetes. It is used with lifestyle changes like diet and exercise. It also treats heart failure and kidney disease. It may lower the risk for treatment of heart failure in the hospital or worsened kidney disease. This medicine may be used for other purposes; ask your health care provider or pharmacist if you have questions. COMMON BRAND NAME(S): Farxiga What should I tell my health care provider before I take this medicine? They need to know if you have any of these conditions:  dehydration  diabetic ketoacidosis  diet low in salt  eating less due to illness, surgery, dieting, or any other reason  having surgery  history of pancreatitis or pancreas problems  history of yeast infection of the penis or vagina  if you often drink alcohol  infection in the bladder, kidneys, or urinary tract  kidney disease  low blood pressure  on dialysis  problems urinating  type 1 diabetes  uncircumcised male  an unusual or allergic reaction to dapagliflozin, other medicines, foods, dyes, or preservatives  pregnant or trying to get pregnant  breast-feeding How should I use this medicine? Take this medicine by mouth with water. Take it as directed on the prescription label at the same time every day. You can take it with or without food. If it upsets your stomach, take it with food. Keep taking it unless your health care provider tells you to stop. A special MedGuide will be given to you by the pharmacist with each prescription and refill. Be sure to read this information carefully each time. Talk to your health care provider about the use of this medicine in children. Special care may be needed. Overdosage: If you think you have taken too much of this medicine contact a poison control center or emergency room at once. NOTE: This medicine is only for you. Do not share this  medicine with others. What if I miss a dose? If you miss a dose, take it as soon as you can. If it is almost time for your next dose, take only that dose. Do not take double or extra doses. What may interact with this medicine? Interactions are not expected. This list may not describe all possible interactions. Give your health care provider a list of all the medicines, herbs, non-prescription drugs, or dietary supplements you use. Also tell them if you smoke, drink alcohol, or use illegal drugs. Some items may interact with your medicine. What should I watch for while using this medicine? Visit your health care provider for regular checks on your progress. Tell your health care provider if your symptoms do not start to get better or if they get worse. This medicine can cause a serious condition in which there is too much acid in the blood. If you develop nausea, vomiting, stomach pain, unusual tiredness, or breathing problems, stop taking this medicine and call your doctor right away. If possible, use a ketone dipstick to check for ketones in your urine. Check with your health care provider if you have severe diarrhea, nausea, and vomiting, or if you sweat a lot. The loss of too much body fluid may make it dangerous for you to take this medicine. A test called the HbA1C (A1C) will be monitored. This is a simple blood test. It measures your blood sugar control over the last 2 to 3 months. You will receive this test every 3 to 6 months. Learn how   to check your blood sugar. Learn the symptoms of low and high blood sugar and how to manage them. Always carry a quick-source of sugar with you in case you have symptoms of low blood sugar. Examples include hard sugar candy or glucose tablets. Make sure others know that you can choke if you eat or drink when you develop serious symptoms of low blood sugar, such as seizures or unconsciousness. Get medical help at once. Tell your health care provider if you have  high blood sugar. You might need to change the dose of your medicine. If you are sick or exercising more than usual, you may need to change the dose of your medicine. Do not skip meals. Ask your health care provider if you should avoid alcohol. Many nonprescription cough and cold products contain sugar or alcohol. These can affect blood sugar. Wear a medical ID bracelet or chain. Carry a card that describes your condition. List the medicines and doses you take on the card. What side effects may I notice from receiving this medicine? Side effects that you should report to your doctor or health care professional as soon as possible:  allergic reactions (skin rash, itching or hives, swelling of the face, lips, or tongue)  breathing problems  dizziness  feeling faint or lightheaded, falls  genital infection (fever; tenderness, redness, or swelling in the genitals or area from the genitals to the back of the rectum)  kidney injury (trouble passing urine or change in the amount of urine)  low blood sugar (feeling anxious; confusion; dizziness; increased hunger; unusually weak or tired; increased sweating; shakiness; cold, clammy skin; irritable; headache; blurred vision; fast heartbeat; loss of consciousness)  muscle weakness  nausea, vomiting, unusual stomach upset or pain  new pain or tenderness, change in skin color, sores or ulcers, or infection in legs or feet  penile discharge, itching, or pain  unusual tiredness  unusual vaginal discharge, itching, or odor  urinary tract infection (fever; chills; a burning feeling when urinating; urgent need to urinate more often; blood in the urine; back pain) Side effects that usually do not require medical attention (report to your doctor or health care professional if they continue or are bothersome):  mild increase in urination  thirsty This list may not describe all possible side effects. Call your doctor for medical advice about side  effects. You may report side effects to FDA at 1-800-FDA-1088. Where should I keep my medicine? Keep out of the reach of children and pets. Store at room temperature between 20 and 25 degrees C (68 and 77 degrees F). Get rid of any unused medicine after the expiration date. To get rid of medicines that are no longer needed or have expired:  Take the medicine to a medicine take-back program. Check with your pharmacy or law enforcement to find a location.  If you cannot return the medicine, check the label or package insert to see if the medicine should be thrown out in the garbage or flushed down the toilet. If you are not sure, ask your health care provider. If it is safe to put it in the trash, take the medicine out of the container. Mix the medicine with cat litter, dirt, coffee grounds, or other unwanted substance. Seal the mixture in a bag or container. Put it in the trash. NOTE: This sheet is a summary. It may not cover all possible information. If you have questions about this medicine, talk to your doctor, pharmacist, or health care provider.  2021   Elsevier/Gold Standard (2020-02-12 13:18:47)  

## 2021-01-27 NOTE — Progress Notes (Signed)
Subjective:  Patient ID: [name], male    DOB: 03-10-1976  Age: 45 y.o. MRN: [id number]  CC: Diabetes   HPI [name] is a 45 year old male with a history of Type 2 Diabetes Mellitus (A1c 12.8), Hyperlipidemia here for chronic disease management.  Interval History: He administers 'between 15-18 units of his NPH'. Blood sugars are 187-250. He complains of shakes when his sugars get to the low 100s. He tries to eat right and informs me he exercises.He has not had hypoglycemic episodes. Not compliant with his Statin. He has no acute concerns today. Past Medical History:  Diagnosis Date  . Diabetes mellitus    Newly diagnosed, May 2012    Past Surgical History:  Procedure Laterality Date  . APPENDECTOMY    . TONSILLECTOMY      Family History  Problem Relation Age of Onset  . Hypertension Mother   . Diabetes Sister        Several brothers and sister with DM  . Diabetes Brother     No Known Allergies  Outpatient Medications Prior to Visit  Medication Sig Dispense Refill  . insulin NPH-regular Human (70-30) 100 UNIT/ML injection INJECT 18 UNITS INTO THE SKIN 2 (TWO) TIMES DAILY WITH A MEAL. 10 mL 3  . Insulin Syringe-Needle U-100 (TRUEPLUS INSULIN SYRINGE) 30G X 5/16" 0.5 ML MISC Use as instructed to inject insulin twice daily. 100 each 2  . menthol-cetylpyridinium (CEPACOL) 3 MG lozenge Take 1 lozenge (3 mg total) by mouth as needed for sore throat. (Patient not taking: No sig reported) 100 tablet 12  . pantoprazole (PROTONIX) 40 MG tablet Take 1 tablet (40 mg total) by mouth daily. (Patient not taking: No sig reported) 30 tablet 4  . phenol (CHLORASEPTIC) 1.4 % LIQD Use as directed 1 spray in the mouth or throat as needed for throat irritation / pain. (Patient not taking: No sig reported) 1 Bottle 0  . rosuvastatin (CRESTOR) 10 MG tablet Take 1 tablet (10 mg total) by mouth daily. To lower cholesterol (Patient not taking: No sig reported) 30 tablet 3   No  facility-administered medications prior to visit.     ROS Review of Systems  Constitutional: Negative for activity change and appetite change.  HENT: Negative for sinus pressure and sore throat.   Eyes: Negative for visual disturbance.  Respiratory: Negative for cough, chest tightness and shortness of breath.   Cardiovascular: Negative for chest pain and leg swelling.  Gastrointestinal: Negative for abdominal distention, abdominal pain, constipation and diarrhea.  Endocrine: Negative.   Genitourinary: Negative for dysuria.  Musculoskeletal: Negative for joint swelling and myalgias.  Skin: Negative for rash.  Allergic/Immunologic: Negative.   Neurological: Negative for weakness, light-headedness and numbness.  Psychiatric/Behavioral: Negative for dysphoric mood and suicidal ideas.    Objective:  BP 101/66   Pulse 88   Ht 6' (1.829 m)   Wt 136 lb 3.2 oz (61.8 kg)   SpO2 100%   BMI 18.47 kg/m   BP/Weight 01/27/2021 08/27/2020 07/08/2020  Systolic BP 101 131 106  Diastolic BP 66 85 71  Wt. (Lbs) 136.2 146 138.8  BMI 18.47 19.8 18.82      Physical Exam Constitutional:      Appearance: He is well-developed.  Neck:     Vascular: No JVD.  Cardiovascular:     Rate and Rhythm: Normal rate.     Heart sounds: Normal heart sounds. No murmur heard.   Pulmonary:     Effort: Pulmonary  effort is normal.     Breath sounds: Normal breath sounds. No wheezing or rales.  Chest:     Chest wall: No tenderness.  Abdominal:     General: Bowel sounds are normal. There is no distension.     Palpations: Abdomen is soft. There is no mass.     Tenderness: There is no abdominal tenderness.  Musculoskeletal:        General: Normal range of motion.     Right lower leg: No edema.     Left lower leg: No edema.  Neurological:     Mental Status: He is alert and oriented to person, place, and time.  Psychiatric:        Mood and Affect: Mood normal.     CMP Latest Ref Rng & Units 04/09/2020  12/25/2019 07/27/2018  Glucose 65 - 99 mg/dL 166(A) 630(Z) 601(U)  BUN 6 - 24 mg/dL 22 18 16   Creatinine 0.76 - 1.27 mg/dL 9.32) 3.55(D  Sodium 134 - 144 mmol/L 140 139 141  Potassium 3.5 - 5.2 mmol/L 4.3 4.0 4.7  Chloride 96 - 106 mmol/L 101 97 101  CO2 20 - 29 mmol/L 27 26 25   Calcium 8.7 - 10.2 mg/dL 3.22 9.7 9.4  Total Protein 6.0 - 8.5 g/dL - 6.9 7.2  Total Bilirubin 0.0 - 1.2 mg/dL - 0.4 0.4  Alkaline Phos 39 - 117 IU/L - 126(H) 96  AST 0 - 40 IU/L - 21 17  ALT 0 - 44 IU/L - 57(H) 24    Lipid Panel     Component Value Date/Time   CHOL 195 12/25/2019 1505   TRIG 148 12/25/2019 1505   HDL 49 12/25/2019 1505   CHOLHDL 4.0 12/25/2019 1505   CHOLHDL 4.0 04/09/2014 1106   VLDL 18 04/09/2014 1106   LDLCALC 120 (H) 12/25/2019 1505    CBC    Component Value Date/Time   WBC 9.5 03/25/2018 0438   RBC 4.26 03/25/2018 0438   HGB 10.4 (L) 03/25/2018 0438   HCT 33.6 (L) 03/25/2018 0438   PLT 192 03/25/2018 0438   MCV 78.9 03/25/2018 0438   MCH 24.4 (L) 03/25/2018 0438   MCHC 31.0 03/25/2018 0438   RDW 13.9 03/25/2018 0438   LYMPHSABS 2.3 03/25/2018 0438   MONOABS 0.8 03/25/2018 0438   EOSABS 0.0 03/25/2018 0438   BASOSABS 0.0 03/25/2018 0438    Lab Results  Component Value Date   HGBA1C 12.8 (A) 01/27/2021    Assessment & Plan:  1. Type 2 diabetes mellitus with hyperglycemia, with long-term current use of insulin (HCC) Uncontrolled with A1c of 12.8; goal is <7.0 He resists increase in dose of insulin due to fear of hypoglycemia GLP1 not an option given low BMI Will add on Farxiga Counseled on Diabetic diet, my plate method, 03/29/2021 minutes of moderate intensity exercise/week Blood sugar logs with fasting goals of 80-120 mg/dl, random of less than 09-23-2002 and in the event of sugars less than 60 mg/dl or greater than 427 mg/dl encouraged to notify the clinic. Advised on the need for annual eye exams, annual foot exams, Pneumonia vaccine. - POCT glucose (manual entry) -  POCT glycosylated hemoglobin (Hb A1C) - Basic Metabolic Panel - rosuvastatin (CRESTOR) 10 MG tablet; Take 1 tablet (10 mg total) by mouth daily. To lower cholesterol  Dispense: 30 tablet; Refill: 6 - dapagliflozin propanediol (FARXIGA) 10 MG TABS tablet; Take 1 tablet (10 mg total) by mouth daily before breakfast.  Dispense: 30 tablet; Refill: 6 -  Microalbumin / creatinine urine ratio  2. Hyperlipidemia LDL goal <70 Non compliant Compliance has been emphasized Low cholesterol diet - rosuvastatin (CRESTOR) 10 MG tablet; Take 1 tablet (10 mg total) by mouth daily. To lower cholesterol  Dispense: 30 tablet; Refill: 6  3. Need for hepatitis C screening test - HCV RNA quant rflx ultra or genotyp([facility])   No orders of the defined types were placed in this encounter.         Hoy Register, MD, FAAFP. [facility] [place], [place] [phone]   01/27/2021, 3:13 PM

## 2021-01-28 LAB — BASIC METABOLIC PANEL
BUN/Creatinine Ratio: 13 (ref 9–20)
BUN: 17 mg/dL (ref 6–24)
CO2: 24 mmol/L (ref 20–29)
Calcium: 9.3 mg/dL (ref 8.7–10.2)
Chloride: 99 mmol/L (ref 96–106)
Creatinine, Ser: 1.33 mg/dL — ABNORMAL HIGH (ref 0.76–1.27)
Glucose: 350 mg/dL — ABNORMAL HIGH (ref 65–99)
Potassium: 4.1 mmol/L (ref 3.5–5.2)
Sodium: 136 mmol/L (ref 134–144)
eGFR: 68 mL/min/{1.73_m2} (ref >59)

## 2021-01-28 LAB — HCV RNA QUANT RFLX ULTRA OR GENOTYP: HCV Quant Baseline: NOT DETECTED IU/mL

## 2021-01-28 LAB — MICROALBUMIN / CREATININE URINE RATIO
Creatinine, Urine: 163 mg/dL
Microalb/Creat Ratio: 42 mg/g creat — ABNORMAL HIGH (ref 0–29)
Microalbumin, Urine: 68.6 ug/mL

## 2021-01-29 ENCOUNTER — Telehealth: Payer: Self-pay

## 2021-01-29 NOTE — Telephone Encounter (Signed)
-----   Message from Hoy Register, MD sent at 01/29/2021 12:33 PM EDT ----- Lab results reveal elevated glucose.  We had initiated a new medication for his diabetes and he will need to be compliant with that in addition to a diabetic diet.

## 2021-01-29 NOTE — Telephone Encounter (Signed)
Patient name and DOB has been verified Patient was informed of lab results. Patient had no questions.  

## 2021-02-03 ENCOUNTER — Other Ambulatory Visit: Payer: Self-pay

## 2021-04-24 ENCOUNTER — Emergency Department (HOSPITAL_COMMUNITY): Payer: Self-pay

## 2021-04-24 ENCOUNTER — Observation Stay (HOSPITAL_COMMUNITY)
Admission: EM | Admit: 2021-04-24 | Discharge: 2021-04-25 | Disposition: A | Discharge status: Home / Self Care | Payer: Self-pay | Attending: Student in an Organized Health Care Education/Training Program | Admitting: Student in an Organized Health Care Education/Training Program

## 2021-04-24 DIAGNOSIS — E1165 Type 2 diabetes mellitus with hyperglycemia: Secondary | ICD-10-CM

## 2021-04-24 DIAGNOSIS — I4891 Unspecified atrial fibrillation: Principal | ICD-10-CM | POA: Insufficient documentation

## 2021-04-24 DIAGNOSIS — Z794 Long term (current) use of insulin: Secondary | ICD-10-CM | POA: Insufficient documentation

## 2021-04-24 DIAGNOSIS — E872 Acidosis, unspecified: Secondary | ICD-10-CM | POA: Diagnosis present

## 2021-04-24 DIAGNOSIS — E1110 Type 2 diabetes mellitus with ketoacidosis without coma: Secondary | ICD-10-CM

## 2021-04-24 DIAGNOSIS — Z20822 Contact with and (suspected) exposure to covid-19: Secondary | ICD-10-CM | POA: Insufficient documentation

## 2021-04-24 DIAGNOSIS — Z87891 Personal history of nicotine dependence: Secondary | ICD-10-CM | POA: Insufficient documentation

## 2021-04-24 DIAGNOSIS — Z8679 Personal history of other diseases of the circulatory system: Secondary | ICD-10-CM | POA: Diagnosis present

## 2021-04-24 LAB — CBC WITH DIFFERENTIAL/PLATELET
Abs Immature Granulocytes: 0.01 10*3/uL (ref 0.00–0.07)
Basophils Absolute: 0.1 10*3/uL (ref 0.0–0.1)
Basophils Relative: 1 %
Eosinophils Absolute: 0.1 10*3/uL (ref 0.0–0.5)
Eosinophils Relative: 1 %
HCT: 36.3 % — ABNORMAL LOW (ref 39.0–52.0)
Hemoglobin: 11 g/dL — ABNORMAL LOW (ref 13.0–17.0)
Immature Granulocytes: 0 %
Lymphocytes Relative: 29 %
Lymphs Abs: 1.6 10*3/uL (ref 0.7–4.0)
MCH: 24.8 pg — ABNORMAL LOW (ref 26.0–34.0)
MCHC: 30.3 g/dL (ref 30.0–36.0)
MCV: 81.9 fL (ref 80.0–100.0)
Monocytes Absolute: 0.3 10*3/uL (ref 0.1–1.0)
Monocytes Relative: 6 %
Neutro Abs: 3.5 10*3/uL (ref 1.7–7.7)
Neutrophils Relative %: 63 %
Platelets: 220 10*3/uL (ref 150–400)
RBC: 4.43 MIL/uL (ref 4.22–5.81)
RDW: 14.1 % (ref 11.5–15.5)
WBC: 5.5 10*3/uL (ref 4.0–10.5)
nRBC: 0 % (ref 0.0–0.2)

## 2021-04-24 LAB — I-STAT VENOUS BLOOD GAS, ED
Acid-base deficit: 5 mmol/L — ABNORMAL HIGH (ref 0.0–2.0)
Bicarbonate: 21.1 mmol/L (ref 20.0–28.0)
Calcium, Ion: 1.08 mmol/L — ABNORMAL LOW (ref 1.15–1.40)
HCT: 38 % — ABNORMAL LOW (ref 39.0–52.0)
Hemoglobin: 12.9 g/dL — ABNORMAL LOW (ref 13.0–17.0)
O2 Saturation: 98 %
Potassium: 4.1 mmol/L (ref 3.5–5.1)
Sodium: 136 mmol/L (ref 135–145)
TCO2: 22 mmol/L (ref 22–32)
pCO2, Ven: 43.2 mmHg — ABNORMAL LOW (ref 44.0–60.0)
pH, Ven: 7.297 (ref 7.250–7.430)
pO2, Ven: 107 mmHg — ABNORMAL HIGH (ref 32.0–45.0)

## 2021-04-24 LAB — COMPREHENSIVE METABOLIC PANEL
ALT: 33 U/L (ref 0–44)
AST: 27 U/L (ref 15–41)
Albumin: 3.3 g/dL — ABNORMAL LOW (ref 3.5–5.0)
Alkaline Phosphatase: 77 U/L (ref 38–126)
Anion gap: 11 (ref 5–15)
BUN: 22 mg/dL — ABNORMAL HIGH (ref 6–20)
CO2: 18 mmol/L — ABNORMAL LOW (ref 22–32)
Calcium: 8.4 mg/dL — ABNORMAL LOW (ref 8.9–10.3)
Chloride: 104 mmol/L (ref 98–111)
Creatinine, Ser: 1.57 mg/dL — ABNORMAL HIGH (ref 0.61–1.24)
GFR, Estimated: 55 mL/min — ABNORMAL LOW (ref >60)
Glucose, Bld: 334 mg/dL — ABNORMAL HIGH (ref 70–99)
Potassium: 4.1 mmol/L (ref 3.5–5.1)
Sodium: 133 mmol/L — ABNORMAL LOW (ref 135–145)
Total Bilirubin: 1.3 mg/dL — ABNORMAL HIGH (ref 0.3–1.2)
Total Protein: 6.1 g/dL — ABNORMAL LOW (ref 6.5–8.1)

## 2021-04-24 LAB — CBG MONITORING, ED
Glucose-Capillary: 122 mg/dL — ABNORMAL HIGH (ref 70–99)
Glucose-Capillary: 142 mg/dL — ABNORMAL HIGH (ref 70–99)
Glucose-Capillary: 155 mg/dL — ABNORMAL HIGH (ref 70–99)
Glucose-Capillary: 216 mg/dL — ABNORMAL HIGH (ref 70–99)
Glucose-Capillary: 265 mg/dL — ABNORMAL HIGH (ref 70–99)
Glucose-Capillary: 344 mg/dL — ABNORMAL HIGH (ref 70–99)

## 2021-04-24 LAB — BASIC METABOLIC PANEL
Anion gap: 6 (ref 5–15)
BUN: 20 mg/dL (ref 6–20)
CO2: 23 mmol/L (ref 22–32)
Calcium: 8.3 mg/dL — ABNORMAL LOW (ref 8.9–10.3)
Chloride: 105 mmol/L (ref 98–111)
Creatinine, Ser: 1.35 mg/dL — ABNORMAL HIGH (ref 0.61–1.24)
GFR, Estimated: >60 mL/min (ref >60)
Glucose, Bld: 123 mg/dL — ABNORMAL HIGH (ref 70–99)
Potassium: 3.8 mmol/L (ref 3.5–5.1)
Sodium: 134 mmol/L — ABNORMAL LOW (ref 135–145)

## 2021-04-24 LAB — TSH: TSH: 1.125 u[IU]/mL (ref 0.350–4.500)

## 2021-04-24 LAB — HIV ANTIBODY (ROUTINE TESTING W REFLEX): HIV Screen 4th Generation wRfx: NONREACTIVE

## 2021-04-24 LAB — BETA-HYDROXYBUTYRIC ACID
Beta-Hydroxybutyric Acid: 2.65 mmol/L — ABNORMAL HIGH (ref 0.05–0.27)
Beta-Hydroxybutyric Acid: 0.13 mmol/L (ref 0.05–0.27)

## 2021-04-24 LAB — MAGNESIUM: Magnesium: 1.7 mg/dL (ref 1.7–2.4)

## 2021-04-24 MED ORDER — POTASSIUM CHLORIDE 10 MEQ/100ML IV SOLN
10.0000 meq | INTRAVENOUS | Status: AC
Start: 2021-04-24 — End: 2021-04-24
  Administered 2021-04-24 (×2): 10 meq via INTRAVENOUS
  Filled 2021-04-24 (×2): qty 100

## 2021-04-24 MED ORDER — ETOMIDATE 2 MG/ML IV SOLN: 0.1000 mg/kg | Freq: Once | INTRAVENOUS | Status: DC

## 2021-04-24 MED ORDER — ONDANSETRON HCL 4 MG/2ML IJ SOLN: 4.0000 mg | Freq: Four times a day (QID) | INTRAMUSCULAR | Status: DC | PRN

## 2021-04-24 MED ORDER — ETOMIDATE 2 MG/ML IV SOLN
INTRAVENOUS | Status: AC | PRN
  Administered 2021-04-24: 6 mg via INTRAVENOUS
  Administered 2021-04-24: 3 mg via INTRAVENOUS

## 2021-04-24 MED ORDER — ETOMIDATE 2 MG/ML IV SOLN
6.0000 mg | Freq: Once | INTRAVENOUS | Status: DC
Start: 2021-04-24 — End: 2021-04-24

## 2021-04-24 MED ORDER — INSULIN ASPART 100 UNIT/ML IJ SOLN: 0.0000 [IU] | Freq: Three times a day (TID) | INTRAMUSCULAR | Status: DC

## 2021-04-24 MED ORDER — INSULIN REGULAR(HUMAN) IN NACL 100-0.9 UT/100ML-% IV SOLN
INTRAVENOUS | Status: DC
  Administered 2021-04-24: 9 [IU]/h via INTRAVENOUS
  Filled 2021-04-24: qty 100

## 2021-04-24 MED ORDER — DEXTROSE 50 % IV SOLN: 0.0000 mL | INTRAVENOUS | Status: DC | PRN

## 2021-04-24 MED ORDER — APIXABAN 5 MG PO TABS
5.0000 mg | ORAL_TABLET | Freq: Two times a day (BID) | ORAL | Status: DC
  Administered 2021-04-24 – 2021-04-25 (×2): 5 mg via ORAL
  Filled 2021-04-24 (×2): qty 1

## 2021-04-24 MED ORDER — INSULIN DETEMIR 100 UNIT/ML ~~LOC~~ SOLN
12.0000 [IU] | Freq: Two times a day (BID) | SUBCUTANEOUS | Status: DC
  Administered 2021-04-24: 12 [IU] via SUBCUTANEOUS
  Filled 2021-04-24 (×4): qty 0.12

## 2021-04-24 MED ORDER — METOPROLOL TARTRATE 25 MG PO TABS: 25.0000 mg | ORAL_TABLET | Freq: Two times a day (BID) | ORAL | Status: DC

## 2021-04-24 MED ORDER — METOPROLOL TARTRATE 5 MG/5ML IV SOLN: 2.5000 mg | INTRAVENOUS | Status: DC

## 2021-04-24 MED ORDER — LORAZEPAM 2 MG/ML IJ SOLN
INTRAMUSCULAR | Status: AC
  Filled 2021-04-24: qty 1

## 2021-04-24 MED ORDER — LACTATED RINGERS IV BOLUS
1000.0000 mL | Freq: Once | INTRAVENOUS | Status: AC
  Administered 2021-04-24: 1000 mL via INTRAVENOUS

## 2021-04-24 MED ORDER — LACTATED RINGERS IV SOLN: INTRAVENOUS | Status: DC

## 2021-04-24 MED ORDER — DEXTROSE IN LACTATED RINGERS 5 % IV SOLN: INTRAVENOUS | Status: DC

## 2021-04-24 MED ORDER — LORAZEPAM 2 MG/ML IJ SOLN
INTRAMUSCULAR | Status: AC | PRN
  Administered 2021-04-24: 1 mg via INTRAVENOUS

## 2021-04-24 MED ORDER — ACETAMINOPHEN 325 MG PO TABS: 650.0000 mg | ORAL_TABLET | ORAL | Status: DC | PRN

## 2021-04-24 NOTE — Progress Notes (Signed)
RT present for conscious sedation for cardioversion.  Patient sats, and vitals, stayed within normal limits during procedure.  No complications.

## 2021-04-24 NOTE — ED Notes (Signed)
Admitting MDs at bedside.

## 2021-04-24 NOTE — H&P (Addendum)
Date: 04/24/2021               Patient Name:  [name] MRN: [id number]  DOB: [birth year] Age / Sex: 45 y.o., male   PCP: Cain Saupe, MD (Inactive)         Medical Service: Internal Medicine Teaching Service         Attending Physician: Dr. Oswaldo Done, Marquita Palms, *    First Contact: Dr. Allena Katz Pager: [phone]  Second Contact: Dr. Mcarthur Rossetti Pager: [phone]       After Hours (After 5p/  First Contact Pager: [phone]  weekends / holidays): Second Contact Pager: [phone]   Chief Complaint: palpitations and profuse sweating  History of Present Illness: [name] is a 45 y.o. male with past medical history of diabetes who presents today with palpitations and diaphoresis, which has never happened to him before. Patient states that this episode started around 1-2 PM when he was heading to the store and his heart started beating really fast and he started sweating profusely.  He had gone up and down a flight of stairs before this happened, but this is nothing out of the ordinary for him, and he was seated comfortably when the episode started. He states that it felt like he was running a marathon. He thought it would go away and tried to drink juice and eat some snacks, but the episode did not resolve, so he called EMS. He has sweated like this before but this did not feel normal. Every once in awhile he feels lightheaded, but he did not feel lightheaded during this episode.  Denies recent illness, recent changes in his diet, or recent changes in his medication regimen.  Denies SOB, though describes that he was "panting" and felt drained during the episode. Denies chest pain, abdominal pain, or weakness.  No recent surgeries or hospitalizations, and he has never been hospitalized for his diabetes.  Denies recent ingestion of alcohol, cocaine, amphetamines, or fish oil supplements. Endorses nausea after the episode started but no vomiting.   EMS/ED course: EMS found the patient to have a  heart rate in the 220s and gave multiple doses of adenosine as well as 20 mg Cardizem. He received 1500 mL prior to ED an received another liter here.  Had soft blood pressures of 80s to 90s systolic, required 3 different shocks to cardiovert. Labs also significant for glu > 300 pH just under 7.3 with a bicarb of 18. Started on glucose and insulin per Endotool.  Meds:  Current Meds  Medication Sig   insulin NPH-regular Human (70-30) 100 UNIT/ML injection INJECT 18 UNITS INTO THE SKIN 2 (TWO) TIMES DAILY WITH A MEAL. (Patient taking differently: Inject 15-18 Units into the skin 2 (two) times daily with a meal. 150-250. 15 units. 251-300. 18 units)    Allergies: Allergies as of 04/24/2021   (No Known Allergies)   Past Medical History:  Diagnosis Date   Diabetes mellitus    Newly diagnosed, May 2012    Family History: DM runs in the family.   Social History: Lives alone, works part-time at [employer]. Occasionally uses marijuana. Denies alcohol use. Used to smoke cigarettes recreationally. Endorses a healthy diet, white meats, and vegetables. Able to perform his ADLs without issue.  Review of Systems: A complete ROS was negative except as per HPI.   Physical Exam: Blood pressure 119/83, pulse 85, temperature (!) 97.5 F (36.4 C), temperature source Oral, resp. rate 20, height 6' (1.829 m),  weight 68 kg, SpO2 100 %. Physical Exam Constitutional:      Appearance: Normal appearance. He is normal weight. He is not diaphoretic.  HENT:     Head: Normocephalic and atraumatic.  Eyes:     Extraocular Movements: Extraocular movements intact.     Pupils: Pupils are equal, round, and reactive to light.  Cardiovascular:     Rate and Rhythm: Regular rhythm. Tachycardia present.     Heart sounds: No murmur heard.   No friction rub. No gallop.  Pulmonary:     Effort: Pulmonary effort is normal. No respiratory distress.     Breath sounds: No stridor. Wheezing present.     Comments:  End-expiratory wheezing in bilateral lower lung fields Abdominal:     General: Abdomen is flat. Bowel sounds are normal. There is no distension.     Palpations: Abdomen is soft.     Tenderness: There is no abdominal tenderness.  Musculoskeletal:        General: No swelling, tenderness, deformity or signs of injury.     Cervical back: Neck supple.     Right lower leg: No edema.     Left lower leg: No edema.  Skin:    General: Skin is warm and dry.  Neurological:     General: No focal deficit present.     Mental Status: He is alert and oriented to person, place, and time. Mental status is at baseline.  Psychiatric:        Mood and Affect: Mood normal.        Behavior: Behavior normal.    BMP Latest Ref Rng & Units 04/24/2021 04/24/2021 04/24/2021  Glucose 70 - 99 mg/dL 250(N) - 397(Q)  BUN 6 - 20 mg/dL 20 - 73(A)  Creatinine 0.61 - 1.24 mg/dL 1.93(X) - 9.02(I)  BUN/Creat Ratio 9 - 20 - - -  Sodium 135 - 145 mmol/L 134(L) 136 133(L)  Potassium 3.5 - 5.1 mmol/L 3.8 4.1 4.1  Chloride 98 - 111 mmol/L 105 - 104  CO2 22 - 32 mmol/L 23 - 18(L)  Calcium 8.9 - 10.3 mg/dL 8.3(L) - 8.4(L)    AG: 11  CBC Latest Ref Rng & Units 04/24/2021 04/24/2021 03/25/2018  WBC 4.0 - 10.5 K/uL - 5.5 9.5  Hemoglobin 13.0 - 17.0 g/dL 12.9(L) 11.0(L) 10.4(L)  Hematocrit 39.0 - 52.0 % 38.0(L) 36.3(L) 33.6(L)  Platelets 150 - 400 K/uL - 220 192    Beta-Hydroxybutyric Acid 0.05 - 0.27 mmol/L 2.65 High     Glucose-Capillary 70 - 99 mg/dL 097 High   353 High  CM  344 High    TSH: 1.125  VBG: 7.297/43.2/107/21.1  EKG: personally reviewed my interpretation is normal sinus rhythm, diffuse ST elevations are unchanged compared to prior EKGs dating back to 2015.  CXR: personally reviewed my interpretation is unremarkable  Assessment & Plan by Problem:  [name] is a 45 y.o. male with a past medical history of diabetes with newly-diagnosed A. Fib of unclear etiology.  Principal Problem:   Atrial fibrillation  with RVR (HCC) Active Problems:   Metabolic acidosis, normal anion gap (NAG)   Type 2 diabetes mellitus with hyperglycemia, with long-term current use of insulin (HCC)  Paroxysmal atrial fibrillation, new diagnosis Patient presents today with new, sudden onset palpitations and diaphoresis. His history is unrevealing in terms of possible A. fib triggers, such as alcohol use, tobacco use, cocaine use, or ingestion of supplements or caffeine. Also denies recent dietary indiscretion, changes to his meds,  or recent illness. On arrival, the patient was tachycardic to 220s and was given multiple doses of adenosine, 20 mg Cardizem, and total of 2.5 L of fluid. Received 3 shocks of cardioversion with successful conversion to sinus rhythm.  Transitioned to Eliquis in the ED after cardioversion.  On exam this evening, the patient was tachycardic with regular rate; also had some end-expiratory wheezes bilaterally, CXR unremarkable. TSH WNL. Work-up so far has been unrevealing in terms of potential A. fib triggers. Will obtain TTE in the morning to r/o structural abnormalities. Will also obtain UA to evaluate for potential infectious source, COVID test also pending.  Of note, patient has a CHA2DS2-VASc score of 1, could consider transition to ASA from eliquis. - LR @ 125 mL/hr for 2 hrs - Cardiac monitoring - Eliquis 5 mg p.o. twice daily - TTE in the AM - UA pending - COVID pending  Non-anion gap metabolic acidosis Diabetes Mellitus H/o DM on novolin 70-30, 18 U BID with meals at home. Last A1c 12.9 in 2021. Initial labs significant for CBG 344, pH 7.29, bicarb 18, AG 11. Beta-Hydroxybutyric Acid 2.65. Overall, this could represent a mildly compensated DKA. In the ED, he was started on insulin and glucose per Endotool. Repeat BMP showed no AG and normal beta-hydroxybutyric acid of 0.13, so we will begin transitioning off Endo tool to SQ regimen. - Levemir 12 U daily and SSI - BMP q4h x 2  Hyperlipidemia LDL  120, total cholesterol 195 in 2021 with a goal of less than 70 per PCP. PCP recommended rosuvastatin therapy, however does not take this medication at home.  Best Practice: Diet: Carb modified IVF: Fluids: LR, Rate:  125 cc/hr x 2 hrs VTE:   Eliquis Code: Full Status: Observation with expected length of stay less than 2 midnights. Anticipated Discharge Location: Home Barriers to Discharge:  Pending medical work-up   Signed: Andrey Campanile, MD 04/24/2021, 10:45 PM  Pager: [phone] After 5pm on weekdays and 1pm on weekends: On Call pager: [phone]

## 2021-04-24 NOTE — ED Notes (Signed)
cbg 344. MD Criss Alvine made aware.

## 2021-04-24 NOTE — ED Provider Notes (Signed)
[facility] EMERGENCY DEPARTMENT Provider Note   CSN: [id number] Arrival date & time: 04/24/21  1446     History No chief complaint on file.   [name] is a 45 y.o. male.  HPI 45 year old male presents with new onset afib.  Patient started feeling poorly at around 1 PM today.  Previous to this he was feeling fine this morning as well as yesterday.  No recent illness.  He denies alcohol use.  He states that he just all of a sudden felt his heart started to beat fast and he felt very tired.  He denies chest pain or dyspnea.  No recent vomiting or diarrhea.  He states his glucose has been fluctuating up and down, mostly due to diet.  EMS found the patient to have a heart rate in the 220s and gave multiple doses of adenosine as well as 20 mg Cardizem.  He was also given 1500 mL IV fluid.  He feels better though is not quite back to normal.  Past Medical History:  Diagnosis Date   Diabetes mellitus    Newly diagnosed, May 2012    Patient Active Problem List   Diagnosis Date Noted   Atrial fibrillation with RVR (HCC) 04/24/2021   Diabetic neuropathy (HCC) 08/27/2020   Type 2 diabetes mellitus with hyperglycemia, with long-term current use of insulin (HCC) 08/27/2020   Hyperlipidemia LDL goal <70 08/27/2020   Leukocytosis 03/22/2018   Normocytic anemia 03/22/2018   Increased anion gap metabolic acidosis 03/22/2018   Hyperbilirubinemia 03/22/2018   AKI (acute kidney injury) (HCC) 03/21/2018   DKA (diabetic ketoacidoses) 01/26/2016   Diabetes mellitus due to underlying condition without complications (HCC) 04/08/2014   Smoking 04/08/2014    Past Surgical History:  Procedure Laterality Date   APPENDECTOMY     TONSILLECTOMY         Family History  Problem Relation Age of Onset   Hypertension Mother    Diabetes Sister        Several brothers and sister with DM   Diabetes Brother     Social History   Tobacco Use   Smoking status: Former    Types:  Cigarettes    Quit date: 10/20/2015    Years since quitting: 5.5   Smokeless tobacco: Former    Quit date: 10/20/2015   Tobacco comments:    Smoking 1 cigs every 4 days  Substance Use Topics   Alcohol use: No    Alcohol/week: 0.0 standard drinks   Drug use: Yes    Types: Marijuana    Comment: last use 2-3 days ago    Home Medications Prior to Admission medications   Medication Sig Start Date End Date Taking? Authorizing Provider  insulin NPH-regular Human (70-30) 100 UNIT/ML injection INJECT 18 UNITS INTO THE SKIN 2 (TWO) TIMES DAILY WITH A MEAL. Patient taking differently: Inject 15-18 Units into the skin 2 (two) times daily with a meal. 150-250. 15 units. 251-300. 18 units 07/08/20 07/08/21 Yes Newlin, Odette Horns, MD  menthol-cetylpyridinium (CEPACOL) 3 MG lozenge Take 1 lozenge (3 mg total) by mouth as needed for sore throat. Patient not taking: No sig reported 03/25/18   Marguerita Merles Latif, DO  pantoprazole (PROTONIX) 40 MG tablet Take 1 tablet (40 mg total) by mouth daily. Patient not taking: No sig reported 12/11/19   Fulp, Cammie, MD  phenol (CHLORASEPTIC) 1.4 % LIQD Use as directed 1 spray in the mouth or throat as needed for throat irritation / pain.  Patient not taking: No sig reported 03/25/18   Marguerita Merles Latif, DO  rosuvastatin (CRESTOR) 10 MG tablet Take 1 tablet (10 mg total) by mouth daily. To lower cholesterol Patient not taking: No sig reported 01/27/21   Hoy Register, MD    Allergies    Patient has no known allergies.  Review of Systems   Review of Systems  Constitutional:  Positive for fatigue. Negative for fever.  Respiratory:  Negative for shortness of breath.   Cardiovascular:  Positive for palpitations. Negative for chest pain.  Gastrointestinal:  Negative for diarrhea and vomiting.  Neurological:  Negative for weakness (no unilateral weakness) and light-headedness.  All other systems reviewed and are negative.  Physical Exam Updated Vital Signs BP  119/77   Pulse 85   Temp (!) 97.5 F (36.4 C) (Oral)   Resp 15   Ht 6' (1.829 m)   Wt 68 kg   SpO2 100%   BMI 20.34 kg/m   Physical Exam Vitals and nursing note reviewed.  Constitutional:      General: He is not in acute distress.    Appearance: He is well-developed. He is not diaphoretic.  HENT:     Head: Normocephalic and atraumatic.     Right Ear: External ear normal.     Left Ear: External ear normal.     Nose: Nose normal.  Eyes:     General:        Right eye: No discharge.        Left eye: No discharge.  Cardiovascular:     Rate and Rhythm: Tachycardia present. Rhythm irregular.     Heart sounds: Normal heart sounds.  Pulmonary:     Effort: Pulmonary effort is normal.     Breath sounds: Normal breath sounds.  Abdominal:     General: There is no distension.     Palpations: Abdomen is soft.     Tenderness: There is no abdominal tenderness.  Musculoskeletal:     Cervical back: Neck supple.  Skin:    General: Skin is warm and dry.  Neurological:     Mental Status: He is alert.  Psychiatric:        Mood and Affect: Mood is not anxious.    ED Results / Procedures / Treatments   Labs (all labs ordered are listed, but only abnormal results are displayed) Labs Reviewed  COMPREHENSIVE METABOLIC PANEL - Abnormal; Notable for the following components:      Result Value   Sodium 133 (*)    CO2 18 (*)    Glucose, Bld 334 (*)    BUN 22 (*)    Creatinine, Ser 1.57 (*)    Calcium 8.4 (*)    Total Protein 6.1 (*)    Albumin 3.3 (*)    Total Bilirubin 1.3 (*)    GFR, Estimated 55 (*)    All other components within normal limits  CBC WITH DIFFERENTIAL/PLATELET - Abnormal; Notable for the following components:   Hemoglobin 11.0 (*)    HCT 36.3 (*)    MCH 24.8 (*)    All other components within normal limits  BETA-HYDROXYBUTYRIC ACID - Abnormal; Notable for the following components:   Beta-Hydroxybutyric Acid 2.65 (*)    All other components within normal limits   I-STAT VENOUS BLOOD GAS, ED - Abnormal; Notable for the following components:   pCO2, Ven 43.2 (*)    pO2, Ven 107.0 (*)    Acid-base deficit 5.0 (*)    Calcium, Ion 1.08 (*)  HCT 38.0 (*)    Hemoglobin 12.9 (*)    All other components within normal limits  CBG MONITORING, ED - Abnormal; Notable for the following components:   Glucose-Capillary 344 (*)    All other components within normal limits  CBG MONITORING, ED - Abnormal; Notable for the following components:   Glucose-Capillary 265 (*)    All other components within normal limits  CBG MONITORING, ED - Abnormal; Notable for the following components:   Glucose-Capillary 142 (*)    All other components within normal limits  MAGNESIUM  TSH  URINALYSIS, ROUTINE W REFLEX MICROSCOPIC  BASIC METABOLIC PANEL  BASIC METABOLIC PANEL  BASIC METABOLIC PANEL  BASIC METABOLIC PANEL  HIV ANTIBODY (ROUTINE TESTING W REFLEX)    EKG EKG Interpretation  Date/Time:  Saturday April 24 2021 16:52:08 EDT Ventricular Rate:  82 PR Interval:  162 QRS Duration: 92 QT Interval:  378 QTC Calculation: 442 R Axis:   96 Text Interpretation: Sinus rhythm Baseline wander in lead(s) V2 ST elevations similar to priors, as far back as 2015 afib resolved Confirmed by Pricilla Loveless [id number]) on 04/24/2021 5:00:20 PM  Radiology DG Chest Portable 1 View  Result Date: 04/24/2021 CLINICAL DATA:  Atrial fibrillation EXAM: PORTABLE CHEST 1 VIEW COMPARISON:  03/21/2018 and prior studies FINDINGS: The cardiomediastinal silhouette is unremarkable. There is no evidence of focal airspace disease, pulmonary edema, suspicious pulmonary nodule/mass, pleural effusion, or pneumothorax. No acute bony abnormalities are identified. IMPRESSION: No active disease. Electronically Signed   By: Harmon Pier M.D.   On: 04/24/2021 16:20    Procedures .Sedation  Date/Time: 04/24/2021 4:45 PM Performed by: Pricilla Loveless, MD Authorized by: Pricilla Loveless, MD   Consent:     Consent obtained:  Verbal and written   Consent given by:  Patient   Risks discussed:  Allergic reaction, dysrhythmia, inadequate sedation, nausea, prolonged sedation necessitating reversal, prolonged hypoxia resulting in organ damage, respiratory compromise necessitating ventilatory assistance and intubation and vomiting Universal protocol:    Immediately prior to procedure, a time out was called: yes     Patient identity confirmed:  Verbally with patient Indications:    Procedure performed:  Cardioversion   Procedure necessitating sedation performed by:  Physician performing sedation Pre-sedation assessment:    Time since last food or drink:  8+ hours   ASA classification: class 2 - patient with mild systemic disease     Mallampati score:  II - soft palate, uvula, fauces visible   Pre-sedation assessments completed and reviewed: airway patency, cardiovascular function, hydration status, mental status, nausea/vomiting, pain level, respiratory function and temperature   Immediate pre-procedure details:    Reviewed: vital signs, relevant labs/tests and NPO status     Verified: bag valve mask available, emergency equipment available, intubation equipment available, IV patency confirmed, oxygen available and suction available   Procedure details (see MAR for exact dosages):    Preoxygenation:  Nasal cannula   Sedation:  Etomidate   Intended level of sedation: deep   Intra-procedure monitoring:  Blood pressure monitoring, cardiac monitor, continuous pulse oximetry, continuous capnometry, frequent LOC assessments and frequent vital sign checks   Intra-procedure events comment:  Myclonus   Intra-procedure management: 1 mg ativan.   Total Provider sedation time (minutes):  15 Post-procedure details:    Attendance: Constant attendance by certified staff until patient recovered     Recovery: Patient returned to pre-procedure baseline     Post-sedation assessments completed and reviewed: airway  patency, cardiovascular function, hydration status, mental status,  nausea/vomiting, pain level, respiratory function and temperature     Patient is stable for discharge or admission: yes     Procedure completion:  Tolerated well, no immediate complications Comments:     Patient developed myoclonic jerks and needed ativan .Cardioversion  Date/Time: 04/24/2021 4:47 PM Performed by: Pricilla Loveless, MD Authorized by: Pricilla Loveless, MD   Consent:    Consent obtained:  Verbal and written   Consent given by:  Patient   Risks discussed:  Cutaneous burn, death, induced arrhythmia and pain Pre-procedure details:    Cardioversion basis:  Emergent   Rhythm:  Atrial fibrillation   Electrode placement:  Anterior-posterior Patient sedated: Yes. Refer to sedation procedure documentation for details of sedation.  Attempt one:    Cardioversion mode:  Synchronous   Waveform:  Biphasic   Shock (Joules):  200   Shock outcome:  No change in rhythm Attempt two:    Cardioversion mode:  Synchronous   Waveform:  Biphasic   Shock (Joules):  200   Shock outcome:  No change in rhythm Attempt three:    Cardioversion mode:  Synchronous   Waveform:  Biphasic   Shock (Joules):  200   Shock outcome:  Conversion to normal sinus rhythm Post-procedure details:    Patient status:  Awake   Patient tolerance of procedure:  Tolerated well, no immediate complications .Critical Care  Date/Time: 04/24/2021 8:52 PM Performed by: Pricilla Loveless, MD Authorized by: Pricilla Loveless, MD   Critical care provider statement:    Critical care time (minutes):  40   Critical care time was exclusive of:  Separately billable procedures and treating other patients   Critical care was necessary to treat or prevent imminent or life-threatening deterioration of the following conditions:  Circulatory failure and cardiac failure   Critical care was time spent personally by me on the following activities:  Discussions with  consultants, evaluation of patient's response to treatment, examination of patient, ordering and performing treatments and interventions, ordering and review of laboratory studies, ordering and review of radiographic studies, pulse oximetry, re-evaluation of patient's condition, obtaining history from patient or surrogate and review of old charts   Medications Ordered in ED Medications  LORazepam (ATIVAN) 2 MG/ML injection (has no administration in time range)  lactated ringers infusion (0 mLs Intravenous Stopped 04/24/21 2025)  apixaban (ELIQUIS) tablet 5 mg (has no administration in time range)  insulin regular, human (MYXREDLIN) 100 units/ 100 mL infusion (0.5 Units/hr Intravenous Rate/Dose Change 04/24/21 2022)  dextrose 5 % in lactated ringers infusion ( Intravenous New Bag/Given 04/24/21 2027)  dextrose 50 % solution 0-50 mL (has no administration in time range)  acetaminophen (TYLENOL) tablet 650 mg (has no administration in time range)  ondansetron (ZOFRAN) injection 4 mg (has no administration in time range)  lactated ringers bolus 1,000 mL (0 mLs Intravenous Stopped 04/24/21 1710)  etomidate (AMIDATE) injection (3 mg Intravenous Given 04/24/21 1615)  LORazepam (ATIVAN) injection (1 mg Intravenous Given 04/24/21 1621)  potassium chloride 10 mEq in 100 mL IVPB ( Intravenous Rate/Dose Change 04/24/21 2033)    ED Course  I have reviewed the triage vital signs and the nursing notes.  Pertinent labs & imaging results that were available during my care of the patient were reviewed by me and considered in my medical decision making (see chart for details).    MDM Rules/Calculators/A&P  Patient presents with new onset A. fib as well as some hypotension which has been partially improved with fluids.  He received 1500 mL prior to being in the ED and received another liter here.  However he still has soft blood pressures and with the new onset A. fib we decided to cardiovert.   Patient agrees.  He was given etomidate given the soft blood pressures which resulted in some myoclonus but this did go away with Ativan.  Otherwise he required 3 different shocks to cardiovert.  Part of this might of been pad placement as I adjusted the pads a little bit on the third attempt.  Either way he is now in normal sinus rhythm.  He does appear that he is probably in some mild/mildly compensated DKA.  His glucose is over 300 and his pH is just under 7.3 with a bicarb of 18.  We will start him on glucose and insulin.  He will need admission for at least obs. Internal medicine teaching service to admit. Final Clinical Impression(s) / ED Diagnoses Final diagnoses:  Atrial fibrillation with RVR (HCC)  Diabetic ketoacidosis without coma associated with type 2 diabetes mellitus (HCC)    [facility]     None        Pricilla LovelessGoldston, Lylianna Fraiser, MD 04/24/21 2106

## 2021-04-24 NOTE — ED Notes (Signed)
Per Dr. Karilyn Cota - DC insulin drip when Levemir received. Levemir received from pharmacy.Insulin DC. CBG 216. Levemir to be given. Dr. Karilyn Cota updated

## 2021-04-24 NOTE — ED Notes (Signed)
Per Dr. Criss Alvine pt is okay to eat

## 2021-04-24 NOTE — ED Triage Notes (Signed)
Pt bib from home c/o weakness &diaphoretic . Pt is diabetic. No hx of Afib.  Initial pressure 70 palpated, HR 220.  EMS given:  6mg  adenosine,  12mg  adenosine.  20mg  Cardizem.  NS  EMS Vitals after meds:  HR 110-150 Bp 108/48 Cbg 433 before the fluids.

## 2021-04-25 ENCOUNTER — Other Ambulatory Visit: Payer: Self-pay

## 2021-04-25 ENCOUNTER — Observation Stay (HOSPITAL_BASED_OUTPATIENT_CLINIC_OR_DEPARTMENT_OTHER): Payer: Self-pay

## 2021-04-25 ENCOUNTER — Encounter (HOSPITAL_COMMUNITY): Payer: Self-pay | Admitting: Student in an Organized Health Care Education/Training Program

## 2021-04-25 DIAGNOSIS — I4891 Unspecified atrial fibrillation: Secondary | ICD-10-CM

## 2021-04-25 LAB — CBC WITH DIFFERENTIAL/PLATELET
Abs Immature Granulocytes: 0.03 10*3/uL (ref 0.00–0.07)
Basophils Absolute: 0.1 10*3/uL (ref 0.0–0.1)
Basophils Relative: 1 %
Eosinophils Absolute: 0.1 10*3/uL (ref 0.0–0.5)
Eosinophils Relative: 1 %
HCT: 33.7 % — ABNORMAL LOW (ref 39.0–52.0)
Hemoglobin: 10.7 g/dL — ABNORMAL LOW (ref 13.0–17.0)
Immature Granulocytes: 0 %
Lymphocytes Relative: 26 %
Lymphs Abs: 2.7 10*3/uL (ref 0.7–4.0)
MCH: 25.2 pg — ABNORMAL LOW (ref 26.0–34.0)
MCHC: 31.8 g/dL (ref 30.0–36.0)
MCV: 79.3 fL — ABNORMAL LOW (ref 80.0–100.0)
Monocytes Absolute: 0.8 10*3/uL (ref 0.1–1.0)
Monocytes Relative: 8 %
Neutro Abs: 6.6 10*3/uL (ref 1.7–7.7)
Neutrophils Relative %: 64 %
Platelets: 228 10*3/uL (ref 150–400)
RBC: 4.25 MIL/uL (ref 4.22–5.81)
RDW: 13.9 % (ref 11.5–15.5)
WBC: 10.3 10*3/uL (ref 4.0–10.5)
nRBC: 0 % (ref 0.0–0.2)

## 2021-04-25 LAB — BASIC METABOLIC PANEL
Anion gap: 6 (ref 5–15)
Anion gap: 7 (ref 5–15)
BUN: 17 mg/dL (ref 6–20)
BUN: 20 mg/dL (ref 6–20)
CO2: 21 mmol/L — ABNORMAL LOW (ref 22–32)
CO2: 23 mmol/L (ref 22–32)
Calcium: 8.3 mg/dL — ABNORMAL LOW (ref 8.9–10.3)
Calcium: 8.8 mg/dL — ABNORMAL LOW (ref 8.9–10.3)
Chloride: 105 mmol/L (ref 98–111)
Chloride: 109 mmol/L (ref 98–111)
Creatinine, Ser: 1.35 mg/dL — ABNORMAL HIGH (ref 0.61–1.24)
Creatinine, Ser: 1.41 mg/dL — ABNORMAL HIGH (ref 0.61–1.24)
GFR, Estimated: >60 mL/min (ref >60)
GFR, Estimated: >60 mL/min (ref >60)
Glucose, Bld: 106 mg/dL — ABNORMAL HIGH (ref 70–99)
Glucose, Bld: 220 mg/dL — ABNORMAL HIGH (ref 70–99)
Potassium: 4 mmol/L (ref 3.5–5.1)
Potassium: 4 mmol/L (ref 3.5–5.1)
Sodium: 133 mmol/L — ABNORMAL LOW (ref 135–145)
Sodium: 138 mmol/L (ref 135–145)

## 2021-04-25 LAB — ECHOCARDIOGRAM COMPLETE
AR max vel: 2.95 cm2
AV Area VTI: 2.96 cm2
AV Area mean vel: 2.83 cm2
AV Mean grad: 1 mmHg
AV Peak grad: 2.7 mmHg
Ao pk vel: 0.82 m/s
Area-P 1/2: 3.42 cm2
Height: 72 in
MV VTI: 2.25 cm2
S' Lateral: 2.6 cm
Weight: 2153.6 oz

## 2021-04-25 LAB — GLUCOSE, CAPILLARY
Glucose-Capillary: 64 mg/dL — ABNORMAL LOW (ref 70–99)
Glucose-Capillary: 111 mg/dL — ABNORMAL HIGH (ref 70–99)
Glucose-Capillary: 115 mg/dL — ABNORMAL HIGH (ref 70–99)

## 2021-04-25 LAB — SARS CORONAVIRUS 2 (TAT 6-24 HRS): SARS Coronavirus 2: NEGATIVE

## 2021-04-25 MED ORDER — INSULIN DETEMIR 100 UNIT/ML ~~LOC~~ SOLN
12.0000 [IU] | Freq: Every day | SUBCUTANEOUS | Status: DC
  Filled 2021-04-25: qty 0.12

## 2021-04-25 MED ORDER — INSULIN NPH ISOPHANE & REGULAR (70-30) 100 UNIT/ML ~~LOC~~ SUSP: 15.0000 [IU] | Freq: Two times a day (BID) | SUBCUTANEOUS | 0 refills | Status: DC

## 2021-04-25 NOTE — ED Notes (Signed)
Report attempted x 1

## 2021-04-25 NOTE — Progress Notes (Signed)
  Echocardiogram 2D Echocardiogram has been performed.  Gerda Diss 04/25/2021, 12:17 PM

## 2021-04-25 NOTE — Social Work (Addendum)
CSW was alerted that pt wanted to speak with CSW. CSW called pt and he wanted receive information about Medicaid or other insurances. CSW inquired if pt had applied for Medicaid before and he stated that he was denied. CSW inquired if tp knew why he was denied and he stated no. CSW suggested that he follow up with DSS on why he was denied. Patient informed CSW that he would call DSS to follow up.  CSW explained that the hospital has financial counselors however they do not work on weekend however will ask for follow up if not discharged today.

## 2021-04-25 NOTE — Discharge Summary (Signed)
Name: [name] MRN: [id number] DOB: 10-28-1975 45 y.o. PCP: Cain Saupe, MD (Inactive)  Date of Admission: 04/24/2021  2:46 PM Date of Discharge: 04/25/2021 Attending Physician: Tyson Alias, *  Discharge Diagnosis: 1. Atrial fibrillation with RVR s/p DCCV 2. Diabetes mellitus with hyperglycemia   Discharge Medications: Allergies as of 04/25/2021   No Known Allergies      Medication List     STOP taking these medications    menthol-cetylpyridinium 3 MG lozenge Commonly known as: CEPACOL   pantoprazole 40 MG tablet Commonly known as: PROTONIX   phenol 1.4 % Liqd Commonly known as: CHLORASEPTIC       TAKE these medications    insulin NPH-regular Human (70-30) 100 UNIT/ML injection Inject 15-18 Units into the skin 2 (two) times daily with a meal. 150-250. 15 units. 251-300. 18 units       ASK your doctor about these medications    rosuvastatin 10 MG tablet Commonly known as: Crestor Take 1 tablet (10 mg total) by mouth daily. To lower cholesterol        Disposition and follow-up:   [name] was discharged from [facility] in Stable condition.  At the hospital follow up visit please address:  1.  Atrial fibrillation: s/p cardioversion; currently in NSR; CHA2Ds2-VASc score 1 - did not initiate anticoagulation during this admission; recommend following up in A.fib clinic  Diabetes mellitus with hyperglycemia: Continue with home Novolog 70/30 15U bid; follow up with PCP for insulin adjustments   2.  Labs / imaging needed at time of follow-up: None  3.  Pending labs/ test needing follow-up: None  Follow-up Appointments:  Follow-up Information     [facility]. Schedule an appointment as soon as possible for a visit in 1 week(s).   Contact information: [address] [place] [address] [phone]        [facility]. Call in 1  day(s).   Specialty: Cardiology Contact information: [address] [id number] [place]he[place] [address] [phone]                Hospital Course by problem list: 1. Atrial fibrillation Patient presented with acute onset palpitations and diaphoresis when going up and down a flight of stairs.  He was noted to be in atrial fibrillation with heart rate in the 220s with EMS for which she was given multiple doses of adenosine and 1 dose of Cardizem.  In the emergency department, patient was also noted to be hypotensive with systolic in 80s to 90s at which point he was cardioverted 3 times.  He has not had any recent acute illnesses, no toxin exposures, pulmonary disease.  Echo without any left atrial dilatation noted.  CHA2DS2-VASc score 1, low risk for stroke, anticoagulation was not initiated during this hospitalization.  He maintained sinus rhythm for the remainder of the hospitalization. Patient is recommended to follow-up in atrial fibrillation clinic.  Referral to the clinic was placed and patient also given contact information for the atrial fibrillation clinic.  2. Diabetes mellitus with hyperglycemia  Patient has 10-year history of poorly controlled diabetes with A1c of 12%.  He reports a strong family history of insulin-dependent diabetes.  Given his young age, no other significant risk factors, and family history, concern if this is type 1 diabetes.  He was unable to tolerate metformin in the past.  He is currently on NovoLog 70/30 15 units twice daily.  He reports that his CBGs range 150-250.  He was noted to be hyperglycemic >300, pH approximately 7.3 with bicarb of 18.  He did not have any anion gap.  However, did have an elevated beta hydroxybutyrate for which insulin drip was initiated.  Patient's beta hydroxybutyrate corrected and he was transitioned to subcutaneous insulin.  He is discharged on his NovoLog 70/30 15 to 18 units twice daily.  He is recommended to  follow-up with his PCP for insulin adjustments.  Discharge Exam:   BP 119/75 (BP Location: Right Arm)   Pulse 85   Temp 97.7 F (36.5 C) (Oral)   Resp 19   Ht 6' (1.829 m)   Wt 61.1 kg   SpO2 99%   BMI 18.26 kg/m  Discharge exam:  Physical Exam  Constitutional: Appears well-developed and well-nourished. No distress.  HENT: Normocephalic and atraumatic, EOMI, conjunctiva normal, moist mucous membranes Cardiovascular: Normal rate, regular rhythm, S1 and S2 present, no murmurs, rubs, gallops.  Distal pulses intact Respiratory: No respiratory distress, no accessory muscle use.  Effort is normal.  Lungs are clear to auscultation bilaterally. GI: Nondistended, soft, nontender to palpation, normal active bowel sounds Musculoskeletal: Normal bulk and tone.  No peripheral edema noted. Neurological: Is alert and oriented x4, no apparent focal deficits noted. Skin: Warm and dry.  No rash, erythema, lesions noted. Psychiatric: Normal mood and affect. Behavior is normal. Judgment and thought content normal.    Pertinent Labs, Studies, and Procedures:  CBC Latest Ref Rng & Units 04/25/2021 04/24/2021 04/24/2021  WBC 4.0 - 10.5 K/uL 10.3 - 5.5  Hemoglobin 13.0 - 17.0 g/dL 10.7(L) 12.9(L) 11.0(L)  Hematocrit 39.0 - 52.0 % 33.7(L) 38.0(L) 36.3(L)  Platelets 150 - 400 K/uL 228 - 220   CMP Latest Ref Rng & Units 04/25/2021 04/25/2021 04/24/2021  Glucose 70 - 99 mg/dL 416(S) 063(K) 160(F)  BUN 6 - 20 mg/dL 17 20 20   Creatinine 0.61 - 1.24 mg/dL ) 0.93(A) 3.55(D)  Sodium 135 - 145 mmol/L 138 133(L) 134(L)  Potassium 3.5 - 5.1 mmol/L 4.0 4.0 3.8  Chloride 98 - 111 mmol/L 109 105 105  CO2 22 - 32 mmol/L 23 21(L) 23  Calcium 8.9 - 10.3 mg/dL 3.22(G) 8.3(L) 8.3(L)  Total Protein 6.5 - 8.1 g/dL - - -  Total Bilirubin 0.3 - 1.2 mg/dL - - -  Alkaline Phos 38 - 126 U/L - - -  AST 15 - 41 U/L - - -  ALT 0 - 44 U/L - - -   TSH 0.350 - 4.500 uIU/mL 1.125    Beta-Hydroxybutyric Acid 0.05 - 0.27 mmol/L 0.13   2.65 High     CXR 04/24/2021:  IMPRESSION: No active disease.  ECHO 04/25/2021: IMPRESSIONS   1. Left ventricular ejection fraction, by estimation, is 55 to 60%. The  left ventricle has normal function. The left ventricle has no regional  wall motion abnormalities. There is mild left ventricular hypertrophy.  Indeterminate diastolic filling due to  E-A fusion.   2. Right ventricular systolic function is normal. The right ventricular  size is normal. Tricuspid regurgitation signal is inadequate for assessing  PA pressure.   3. A small pericardial effusion is present. There is no evidence of  cardiac tamponade.   4. The mitral valve is normal in structure. Trivial mitral valve  regurgitation. No evidence of mitral stenosis.   5. The aortic valve was not well visualized. There is mild calcification  of the aortic valve. Aortic valve regurgitation is not visualized.  No  aortic stenosis is present.   6. Aortic dilatation noted. There is mild dilatation of the aortic root,  measuring 39 mm.   7. The inferior vena cava is normal in size with greater than 50%  respiratory variability, suggesting right atrial pressure of 3 mmHg.    Discharge Instructions: Discharge Instructions     Amb Referral to AFIB Clinic   Complete by: As directed    Admitted for Afib with RVR s/p DCCV   Call MD for:  difficulty breathing, headache or visual disturbances   Complete by: As directed    Call MD for:  extreme fatigue   Complete by: As directed    Call MD for:  persistant dizziness or light-headedness   Complete by: As directed    Call MD for:  persistant nausea and vomiting   Complete by: As directed    Call MD for:  severe uncontrolled pain   Complete by: As directed    Diet - low sodium heart healthy   Complete by: As directed    Discharge instructions   Complete by: As directed    [name], [name] were admitted to the hospital with atrial fibrillation with elevated heart rates. You were  cardioverted/shocked in the ED to normal rhythm and were observed overnight. On discharge, please follow up in the Atrial fibrillation clinic. A referral has been placed and you will be contacted to schedule an appointment.  Please follow up with your primary care doctor for diabetes management and hospital follow up as soon as possible.  Thank you!   Increase activity slowly   Complete by: As directed        Signed: Eliezer Bottom, MD IMTS PGY-3 04/25/2021, 2:31 PM   Pager: [phone]

## 2021-04-26 ENCOUNTER — Telehealth: Payer: Self-pay

## 2021-04-26 NOTE — Telephone Encounter (Signed)
Transition Care Management Unsuccessful Follow-up Telephone Call  Date of discharge and from where:  04/25/2021, [facility]   Attempts:  1st Attempt  Reason for unsuccessful TCM follow-up call:  Unable to reach patient - calls placed to multiple numbers on file,   # [phone] and the recording stated that the number is not in service # [phone] - the recording stated "[employer]."  # [phone]  - the recording stated that the call could not be completed at this time.   Patient needs to schedule a hospital follow up appointment.  He has the phone number for [facility] on his AVS

## 2021-04-27 ENCOUNTER — Telehealth: Payer: Self-pay

## 2021-04-27 NOTE — Telephone Encounter (Signed)
Transition Care Management Unsuccessful Follow-up Telephone Call  Date of discharge and from where:  04/25/2021, [facility]   Attempts:  2nd Attempt  Reason for unsuccessful TCM follow-up call:  Unable to reach patient- calls placed to multiple numbers on file,  # [phone] and the recording stated that the number is not in service # [phone] - the recording stated "[employer]."  # [phone]  - the recording stated that the call could not be completed at this time.   Patient needs to schedule a hospital follow up appointment.  He has the phone number for [facility] on his AVS

## 2021-04-28 ENCOUNTER — Telehealth: Payer: Self-pay

## 2021-04-28 NOTE — Telephone Encounter (Signed)
Transition Care Management Unsuccessful Follow-up Telephone Call  Date of discharge and from where:  04/25/2021, [facility]   Attempts:  3rd Attempt  Reason for unsuccessful TCM follow-up call:  Unable to reach patient   calls placed to multiple numbers on file,  # [phone] and the recording stated that the number is not in service # [phone]  - the recording stated that the call could not be completed at this time.   Letter sent to patient requesting he contact [facility] to schedule a hospital follow up appointment.  He also has the phone number for [facility] on his AVS

## 2021-05-05 ENCOUNTER — Encounter (HOSPITAL_COMMUNITY): Payer: Self-pay | Admitting: Nurse Practitioner

## 2021-05-05 ENCOUNTER — Other Ambulatory Visit: Payer: Self-pay

## 2021-05-05 ENCOUNTER — Ambulatory Visit (HOSPITAL_COMMUNITY)
Admission: RE | Admit: 2021-05-05 | Discharge: 2021-05-05 | Disposition: A | Discharge status: Home / Self Care | Payer: Self-pay | Source: Ambulatory Visit | Attending: Nurse Practitioner | Admitting: Nurse Practitioner

## 2021-05-05 VITALS — BP 104/64 | HR 101 | Ht 72.0 in | Wt 132.6 lb

## 2021-05-05 DIAGNOSIS — F1290 Cannabis use, unspecified, uncomplicated: Secondary | ICD-10-CM | POA: Insufficient documentation

## 2021-05-05 DIAGNOSIS — Z8249 Family history of ischemic heart disease and other diseases of the circulatory system: Secondary | ICD-10-CM | POA: Insufficient documentation

## 2021-05-05 DIAGNOSIS — Z87891 Personal history of nicotine dependence: Secondary | ICD-10-CM | POA: Insufficient documentation

## 2021-05-05 DIAGNOSIS — Z833 Family history of diabetes mellitus: Secondary | ICD-10-CM | POA: Insufficient documentation

## 2021-05-05 DIAGNOSIS — E109 Type 1 diabetes mellitus without complications: Secondary | ICD-10-CM | POA: Insufficient documentation

## 2021-05-05 DIAGNOSIS — Z79899 Other long term (current) drug therapy: Secondary | ICD-10-CM | POA: Insufficient documentation

## 2021-05-05 DIAGNOSIS — Z794 Long term (current) use of insulin: Secondary | ICD-10-CM | POA: Insufficient documentation

## 2021-05-05 DIAGNOSIS — I4891 Unspecified atrial fibrillation: Secondary | ICD-10-CM | POA: Insufficient documentation

## 2021-05-05 NOTE — Progress Notes (Addendum)
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 He was given several doses of adenosine as well as 20 mg cardizem IV and still did not break. He was then cardioverted x 3 ultimately resuming SR.   In the afib clinic, he is in a sinus tach at 101 bpm. He states he walked a long distance to the clinic and is anxious re the outcome. He reports no alcohol use, no snoring, no excessive caffeine, but does smoke marijuana occasionally. He denies smoking prior to onset of palpitations. He reports that he feels improved. Echo done showed normal EF.   Today, he denies symptoms of palpitations, chest pain, shortness of breath, orthopnea, PND, lower extremity edema, dizziness, presyncope, syncope, or neurologic sequela. The patient is tolerating medications without difficulties and is otherwise without complaint today.   Past Medical History:  Diagnosis Date   Diabetes mellitus    Newly diagnosed, May 2012   Past Surgical History:  Procedure Laterality Date   APPENDECTOMY     TONSILLECTOMY      Current Outpatient Medications  Medication Sig Dispense Refill   insulin NPH-regular Human (70-30) 100 UNIT/ML injection Inject 15-18 Units into the skin 2 (two) times daily with a meal. 150-250. 15 units. 251-300. 18 units 10.8 mL 0   rosuvastatin (CRESTOR) 10 MG tablet Take 1 tablet (10 mg total) by mouth daily. To lower cholesterol 30 tablet 6   No current facility-administered medications for this encounter.    No Known Allergies  Social History   Socioeconomic History   Marital status: Single    Spouse name: Not on file   Number of children: Not on file   Years of education: Not on file   Highest education level: Not on file  Occupational History   Not on file   Tobacco Use   Smoking status: Former    Types: Cigarettes    Quit date: 10/20/2015    Years since quitting: 5.5   Smokeless tobacco: Former    Quit date: 10/20/2015   Tobacco comments:    Former smoker 05/05/2021  Substance and Sexual Activity   Alcohol use: No    Alcohol/week: 0.0 standard drinks   Drug use: Yes    Types: Marijuana    Comment: every 1-3 days   Sexual activity: Not on file  Other Topics Concern   Not on file  Social History Narrative   Currently lives with his parents in [place]. Works at [employer]. Denies any current alcohol, tobacco or illicit drug use.         Social Determinants of Health   Financial Resource Strain: Not on file  Food Insecurity: Not on file  Transportation Needs: Not on file  Physical Activity: Not on file  Stress: Not on file  Social Connections: Not on file  Intimate Partner Violence: Not on file    Family History  Problem Relation Age of Onset   Hypertension Mother    Diabetes Sister        Several brothers and sister with DM   Diabetes Brother     ROS- All systems are reviewed and negative except as per the HPI above  Physical Exam: Vitals:   05/05/21 1513  BP: 104/64  Pulse: (!) 101  Weight: 60.1 kg  Height: 6' (1.829 m)   Wt Readings from Last 3 Encounters:  05/05/21 60.1 kg  04/25/21 61.1 kg  01/27/21 61.8 kg    Labs: Lab Results  Component Value Date   NA 138 04/25/2021   K 4.0 04/25/2021   CL 109 04/25/2021   CO2 23 04/25/2021   GLUCOSE 106 (H) 04/25/2021   BUN 17 04/25/2021   CREATININE 1.41 (H) 04/25/2021   CALCIUM 8.8 (L) 04/25/2021   PHOS 2.7 03/25/2018   MG 1.7 04/24/2021   Lab Results  Component Value Date   INR 0.93 02/07/2011   Lab Results  Component Value Date   CHOL 195 12/25/2019   HDL 49 12/25/2019   LDLCALC 120 (H) 12/25/2019   TRIG 148 12/25/2019     GEN- The patient is well appearing, alert and oriented x 3 today.   Head- normocephalic, atraumatic Eyes-  Sclera  clear, conjunctiva pink Ears- hearing intact Oropharynx- clear Neck- supple, no JVP Lymph- no cervical lymphadenopathy Lungs- Clear to ausculation bilaterally, normal work of breathing Heart- Regular rate and rhythm, no murmurs, rubs or gallops, PMI not laterally displaced GI- soft, NT, ND, + BS Extremities- no clubbing, cyanosis, or edema MS- no significant deformity or atrophy Skin- no rash or lesion Psych- euthymic mood, full affect Neuro- strength and sensation are intact  EKG-1. Left ventricular ejection fraction, by estimation, is 55 to 60%. The  left ventricle has normal function. The left ventricle has no regional  wall motion abnormalities. There is mild left ventricular hypertrophy.  Indeterminate diastolic filling due to  E-A fusion.   2. Right ventricular systolic function is normal. The right ventricular  size is normal. Tricuspid regurgitation signal is inadequate for assessing  PA pressure.   3. A small pericardial effusion is present. There is no evidence of  cardiac tamponade.   4. The mitral valve is normal in structure. Trivial mitral valve  regurgitation. No evidence of mitral stenosis.   5. The aortic valve was not well visualized. There is mild calcification  of the aortic valve. Aortic valve regurgitation is not visualized. No  aortic stenosis is present.   6. Aortic dilatation noted. There is mild dilatation of the aortic root,  measuring 39 mm.   7. The inferior vena cava is normal in size with greater than 50%  respiratory variability, suggesting right atrial pressure of 3 mmHg.   EKG today shows sinus tachy at 101 bpm, pr int 150 ms, qrs int at 92 ms, qtc 438 ms   Epic records reviewed    Assessment and Plan:  1. New onset afib with RVR Successful cardioversion and remains in SR today General education re afib and triggers  Pt advised to stop marijuana use   2. CHA2DS2VASc  score of 1 (DM) Pt was not started on anticoagulation for low risk  stroke score However, it is [facility] cardioversion protocol that anyone cardioverted,  despite CHA2DS2VASc score, will be placed on anticoagulation for 4 weeks afterward.  I discussed this with pt today but since it has one week since DCCV he defers start of anticoagulation.   3. DM Per PCP  F/u with PCP 05/06/21 as scheduled Afib clinic as needed   [name] [address] [place], [place] [address] [phone]

## 2021-05-06 ENCOUNTER — Ambulatory Visit: Payer: Self-pay | Attending: Physician Assistant | Admitting: Physician Assistant

## 2021-05-06 ENCOUNTER — Other Ambulatory Visit: Payer: Self-pay

## 2021-05-06 ENCOUNTER — Encounter: Payer: Self-pay | Admitting: Physician Assistant

## 2021-05-06 VITALS — BP 124/82 | HR 94 | Ht 72.0 in | Wt 131.0 lb

## 2021-05-06 DIAGNOSIS — E1165 Type 2 diabetes mellitus with hyperglycemia: Secondary | ICD-10-CM

## 2021-05-06 DIAGNOSIS — I4891 Unspecified atrial fibrillation: Secondary | ICD-10-CM

## 2021-05-06 DIAGNOSIS — Z09 Encounter for follow-up examination after completed treatment for conditions other than malignant neoplasm: Secondary | ICD-10-CM

## 2021-05-06 DIAGNOSIS — E785 Hyperlipidemia, unspecified: Secondary | ICD-10-CM

## 2021-05-06 DIAGNOSIS — Z794 Long term (current) use of insulin: Secondary | ICD-10-CM

## 2021-05-06 LAB — POCT GLYCOSYLATED HEMOGLOBIN (HGB A1C): HbA1c, POC (controlled diabetic range): 12.8 % — AB (ref 0.0–7.0)

## 2021-05-06 LAB — GLUCOSE, POCT (MANUAL RESULT ENTRY): POC Glucose: 266 mg/dl — AB (ref 70–99)

## 2021-05-06 MED ORDER — "INSULIN SYRINGE-NEEDLE U-100 30G X 5/16"" 0.3 ML MISC"
1.0000 | Freq: Two times a day (BID) | 4 refills | Status: DC
  Filled 2021-05-06: qty 100, 50d supply, fill #0

## 2021-05-06 MED ORDER — INSULIN PEN NEEDLE 31G X 5 MM MISC
1.0000 | Freq: Two times a day (BID) | 3 refills | Status: DC
  Filled 2021-05-06: qty 100, 50d supply, fill #0
  Filled 2021-06-28: qty 100, 50d supply, fill #1

## 2021-05-06 MED ORDER — INSULIN ISOPHANE & REGULAR (HUMAN 70-30)100 UNIT/ML KWIKPEN
20.0000 [IU] | PEN_INJECTOR | Freq: Two times a day (BID) | SUBCUTANEOUS | 4 refills | Status: DC
  Filled 2021-05-06: qty 12, 30d supply, fill #0
  Filled 2021-06-28: qty 12, 30d supply, fill #1
  Filled 2021-08-17: qty 12, 30d supply, fill #2

## 2021-05-06 NOTE — Progress Notes (Signed)
Patient ID: [name], male   DOB: 01/05/1976, 45 y.o.   MRN: [id number]       Barron Vanloan, is a 45 y.o. male  GYB:[id number]  AJG:[id number]  DOB - 11-Feb-1976  Chief Complaint  Patient presents with   Hospitalization Follow-up       Subjective:   [name] is a 45 y.o. male here today for a follow up visit and to establish care. After overnight hospitalization with Afib 8/6-04/25/2021.  Saw cardiology in f/up 05/05/2021.  No further CP/SOB/palpitations.  He says he feels well overall.  He has been taking 15-18 units novolog twice daily.  Says lantus didn't work for him.  He feels that his blood sugars are fine with running 200-300.  He is happy with 12.8 A1C.  Says he is 100% compliant with insulin  From discharge summary: 1.  Atrial fibrillation: s/p cardioversion; currently in NSR; CHA2Ds2-VASc score 1 - did not initiate anticoagulation during this admission; recommend following up in A.fib clinic   Diabetes mellitus with hyperglycemia: Continue with home Novolog 70/30 15U bid; follow up with PCP for insulin adjustments    2.  Labs / imaging needed at time of follow-up: None   3.  Pending labs/ test needing follow-up: None  1. Atrial fibrillation Patient presented with acute onset palpitations and diaphoresis when going up and down a flight of stairs.  He was noted to be in atrial fibrillation with heart rate in the 220s with EMS for which she was given multiple doses of adenosine and 1 dose of Cardizem.  In the emergency department, patient was also noted to be hypotensive with systolic in 80s to 90s at which point he was cardioverted 3 times.  He has not had any recent acute illnesses, no toxin exposures, pulmonary disease.  Echo without any left atrial dilatation noted.  CHA2DS2-VASc score 1, low risk for stroke, anticoagulation was not initiated during this hospitalization.  He maintained sinus rhythm for the remainder of the hospitalization. Patient is recommended to  follow-up in atrial fibrillation clinic.  Referral to the clinic was placed and patient also given contact information for the atrial fibrillation clinic.   2. Diabetes mellitus with hyperglycemia  Patient has 10-year history of poorly controlled diabetes with A1c of 12%.  He reports a strong family history of insulin-dependent diabetes.  Given his young age, no other significant risk factors, and family history, concern if this is type 1 diabetes.  He was unable to tolerate metformin in the past.  He is currently on NovoLog 70/30 15 units twice daily.  He reports that his CBGs range 150-250.  He was noted to be hyperglycemic >300, pH approximately 7.3 with bicarb of 18.  He did not have any anion gap.  However, did have an elevated beta hydroxybutyrate for which insulin drip was initiated.  Patient's beta hydroxybutyrate corrected and he was transitioned to subcutaneous insulin.  He is discharged on his NovoLog 70/30 15 to 18 units twice daily.  He is recommended to follow-up with his PCP for insulin adjustments.  A/P from cardiology 8/17:  Assessment and Plan:  1. New onset afib with RVR Successful cardioversion and remains in SR today General education re afib and triggers  Pt advised to stop marijuana use    2. CHA2DS2VASc  score of 1 (DM) Pt was not started on anticoagulation for low risk stroke score However, it is [facility] cardioversion protocol that anyone cardioverted,  despite CHA2DS2VASc score, will be placed on anticoagulation for  4 weeks afterward.  I discussed this with pt today but since it has one week since DCCV he defers start of anticoagulation.    3. DM Per PCP   No problems updated.  ALLERGIES: No Known Allergies  PAST MEDICAL HISTORY: Past Medical History:  Diagnosis Date   Diabetes mellitus    Newly diagnosed, May 2012    MEDICATIONS AT HOME: Prior to Admission medications   Medication Sig Start Date End Date Taking? Authorizing Provider  Insulin Syringe-Needle  U-100 (GLOBAL INSULIN SYRINGES) 30G X 1/2" 0.3 ML MISC 1 each by Does not apply route 2 (two) times daily. 05/06/21  Yes Georgian Co M, PA-C  rosuvastatin (CRESTOR) 10 MG tablet Take 1 tablet (10 mg total) by mouth daily. To lower cholesterol 01/27/21  Yes Hoy Register, MD  insulin NPH-regular Human (70-30) 100 UNIT/ML injection Inject 20 Units into the skin 2 (two) times daily with a meal. 05/06/21 06/05/21  Zeno Hickel, Marzella Schlein, PA-C    ROS: Neg HEENT Neg resp Neg cardiac Neg GI Neg GU Neg MS Neg psych Neg neuro  Objective:   Vitals:   05/06/21 1410  BP: 124/82  Pulse: 94  SpO2: 99%  Weight: 131 lb (59.4 kg)  Height: 6' (1.829 m)   Exam General appearance : Awake, alert, not in any distress. Speech Clear. Not toxic looking; very thin HEENT: Atraumatic and Normocephalic Neck: Supple, no JVD. No cervical lymphadenopathy.  Chest: Good air entry bilaterally, CTAB.  No rales/rhonchi/wheezing CVS: S1 S2 regular, no murmurs.  Extremities: B/L Lower Ext shows no edema, both legs are warm to touch Neurology: Awake alert, and oriented X 3, CN II-XII intact, Non focal Skin: No Rash  Data Review Lab Results  Component Value Date   HGBA1C 12.8 (A) 05/06/2021   HGBA1C 12.8 (A) 01/27/2021   HGBA1C 12.8 (A) 07/08/2020    Assessment & Plan   1. Type 2 diabetes mellitus with hyperglycemia, with long-term current use of insulin (HCC) Not controlled-check blood sugars bid and record and bring to next visit with [facility].  Increase novolog to 20 bid with meals - Glucose (CBG) - HgB A1c - insulin NPH-regular Human (70-30) 100 UNIT/ML injection; Inject 20 Units into the skin 2 (two) times daily with a meal.  Dispense: 12 mL; Refill: 4 - Insulin Syringe-Needle U-100 (GLOBAL INSULIN SYRINGES) 30G X 1/2" 0.3 ML MISC; 1 each by Does not apply route 2 (two) times daily.  Dispense: 100 each; Refill: 4 - Comprehensive metabolic panel; Future  2. Hyperlipidemia LDL goal <70 - Comprehensive  metabolic panel; Future - Lipid panel; Future  3. Hospital discharge follow-up  4. Atrial fibrillation with RVR (HCC) Stable/followed by cardiology.      Patient have been counseled extensively about nutrition and exercise. Other issues discussed during this visit include: low cholesterol diet, weight control and daily exercise, foot care, annual eye examinations at Ophthalmology, importance of adherence with medications and regular follow-up. We also discussed long term complications of uncontrolled diabetes and hypertension.   Return for 3 weeks with [name];  3 months assign new PCP.  The patient was given clear instructions to go to ER or return to medical center if symptoms don't improve, worsen or new problems develop. The patient verbalized understanding. The patient was told to call to get lab results if they haven't heard anything in the next week.      Georgian Co, PA-C [facility] [place], [place] [phone]   05/06/2021, 2:52 PM

## 2021-05-06 NOTE — Addendum Note (Signed)
Addended by: Anders Simmonds on: 05/06/2021 03:18 PM   Modules accepted: Orders

## 2021-05-13 ENCOUNTER — Other Ambulatory Visit: Payer: Self-pay

## 2021-05-14 ENCOUNTER — Ambulatory Visit: Payer: Self-pay | Attending: Family Medicine

## 2021-05-14 ENCOUNTER — Other Ambulatory Visit: Payer: Self-pay

## 2021-05-14 DIAGNOSIS — E1165 Type 2 diabetes mellitus with hyperglycemia: Secondary | ICD-10-CM

## 2021-05-14 DIAGNOSIS — E785 Hyperlipidemia, unspecified: Secondary | ICD-10-CM

## 2021-05-15 LAB — COMPREHENSIVE METABOLIC PANEL
ALT: 86 IU/L — ABNORMAL HIGH (ref 0–44)
AST: 87 IU/L — ABNORMAL HIGH (ref 0–40)
Albumin/Globulin Ratio: 1.5 (ref 1.2–2.2)
Albumin: 3.7 g/dL — ABNORMAL LOW (ref 4.0–5.0)
Alkaline Phosphatase: 101 IU/L (ref 44–121)
BUN/Creatinine Ratio: 14 (ref 9–20)
BUN: 17 mg/dL (ref 6–24)
Bilirubin Total: <0.2 mg/dL (ref 0.0–1.2)
CO2: 24 mmol/L (ref 20–29)
Calcium: 8.9 mg/dL (ref 8.7–10.2)
Chloride: 101 mmol/L (ref 96–106)
Creatinine, Ser: 1.25 mg/dL (ref 0.76–1.27)
Globulin, Total: 2.5 g/dL (ref 1.5–4.5)
Glucose: 402 mg/dL — ABNORMAL HIGH (ref 65–99)
Potassium: 4.2 mmol/L (ref 3.5–5.2)
Sodium: 139 mmol/L (ref 134–144)
Total Protein: 6.2 g/dL (ref 6.0–8.5)
eGFR: 73 mL/min/{1.73_m2} (ref >59)

## 2021-05-15 LAB — LIPID PANEL
Chol/HDL Ratio: 3.4 ratio (ref 0.0–5.0)
Cholesterol, Total: 183 mg/dL (ref 100–199)
HDL: 54 mg/dL (ref >39)
LDL Chol Calc (NIH): 110 mg/dL — ABNORMAL HIGH (ref 0–99)
Triglycerides: 106 mg/dL (ref 0–149)
VLDL Cholesterol Cal: 19 mg/dL (ref 5–40)

## 2021-05-28 ENCOUNTER — Ambulatory Visit: Payer: Self-pay | Admitting: Pharmacist

## 2021-06-10 ENCOUNTER — Ambulatory Visit: Payer: Self-pay | Admitting: Pharmacist

## 2021-06-24 ENCOUNTER — Ambulatory Visit: Payer: Self-pay | Admitting: Pharmacist

## 2021-06-28 ENCOUNTER — Other Ambulatory Visit: Payer: Self-pay

## 2021-08-09 ENCOUNTER — Ambulatory Visit: Payer: Self-pay | Admitting: Critical Care Medicine

## 2021-08-17 ENCOUNTER — Other Ambulatory Visit: Payer: Self-pay

## 2021-08-17 ENCOUNTER — Encounter: Payer: Self-pay | Admitting: Critical Care Medicine

## 2021-08-17 ENCOUNTER — Ambulatory Visit: Payer: Self-pay | Attending: Critical Care Medicine | Admitting: Critical Care Medicine

## 2021-08-17 VITALS — BP 127/83 | HR 88 | Resp 16 | Wt 136.2 lb

## 2021-08-17 DIAGNOSIS — N179 Acute kidney failure, unspecified: Secondary | ICD-10-CM

## 2021-08-17 DIAGNOSIS — Z8679 Personal history of other diseases of the circulatory system: Secondary | ICD-10-CM

## 2021-08-17 DIAGNOSIS — E1165 Type 2 diabetes mellitus with hyperglycemia: Secondary | ICD-10-CM

## 2021-08-17 DIAGNOSIS — E0849 Diabetes mellitus due to underlying condition with other diabetic neurological complication: Secondary | ICD-10-CM

## 2021-08-17 DIAGNOSIS — E785 Hyperlipidemia, unspecified: Secondary | ICD-10-CM

## 2021-08-17 DIAGNOSIS — Z87891 Personal history of nicotine dependence: Secondary | ICD-10-CM

## 2021-08-17 DIAGNOSIS — K029 Dental caries, unspecified: Secondary | ICD-10-CM

## 2021-08-17 DIAGNOSIS — Z794 Long term (current) use of insulin: Secondary | ICD-10-CM

## 2021-08-17 DIAGNOSIS — E11630 Type 2 diabetes mellitus with periodontal disease: Secondary | ICD-10-CM | POA: Insufficient documentation

## 2021-08-17 DIAGNOSIS — Z1159 Encounter for screening for other viral diseases: Secondary | ICD-10-CM

## 2021-08-17 LAB — POCT GLYCOSYLATED HEMOGLOBIN (HGB A1C): HbA1c, POC (controlled diabetic range): 12.7 % — AB (ref 0.0–7.0)

## 2021-08-17 LAB — GLUCOSE, POCT (MANUAL RESULT ENTRY): POC Glucose: 286 mg/dl — AB (ref 70–99)

## 2021-08-17 MED ORDER — INSULIN PEN NEEDLE 31G X 5 MM MISC
1.0000 | Freq: Two times a day (BID) | 3 refills | Status: DC
  Filled 2021-08-17: qty 100, 50d supply, fill #0
  Filled 2021-10-14: qty 100, 33d supply, fill #0
  Filled 2021-11-24: qty 100, 33d supply, fill #1
  Filled 2021-12-17: qty 100, 33d supply, fill #2

## 2021-08-17 MED ORDER — ROSUVASTATIN CALCIUM 20 MG PO TABS
20.0000 mg | ORAL_TABLET | Freq: Every day | ORAL | 4 refills | Status: DC
  Filled 2021-08-17: qty 30, 30d supply, fill #0

## 2021-08-17 MED ORDER — BASAGLAR KWIKPEN 100 UNIT/ML ~~LOC~~ SOPN
30.0000 [IU] | PEN_INJECTOR | Freq: Every day | SUBCUTANEOUS | 4 refills | Status: DC
  Filled 2021-08-17 – 2021-10-14 (×2): qty 12, 40d supply, fill #0

## 2021-08-17 MED ORDER — INSULIN LISPRO (1 UNIT DIAL) 100 UNIT/ML (KWIKPEN)
6.0000 [IU] | PEN_INJECTOR | Freq: Two times a day (BID) | SUBCUTANEOUS | 4 refills | Status: DC
  Filled 2021-08-17: qty 6, 50d supply, fill #0
  Filled 2021-10-14: qty 3, 25d supply, fill #0
  Filled 2021-11-24: qty 3, 25d supply, fill #1
  Filled 2021-12-17: qty 3, 25d supply, fill #2
  Filled 2022-02-07: qty 3, 25d supply, fill #3
  Filled 2022-03-18: qty 3, 25d supply, fill #4
  Filled 2022-04-11: qty 3, 25d supply, fill #5

## 2021-08-17 NOTE — Assessment & Plan Note (Signed)
History of acute kidney injury has resolved

## 2021-08-17 NOTE — Assessment & Plan Note (Signed)
Encouraged to apply and obtain the orange card so can send him for dental care

## 2021-08-17 NOTE — Assessment & Plan Note (Signed)
Currently stable at this time 

## 2021-08-17 NOTE — Patient Instructions (Signed)
Obtain metabolic panel blood count and hepatitis C levels in the lab today  Discontinue 70/30 insulin and begin insulin glargine 30 units at bedtime and insulin lispro 6 units before lunch and dinner  Check your blood sugar before lunch and dinner and if it is less than 150 do not give the insulin lispro  Follow a healthier diet as we discussed see attached, you need more fiber in your diet  Avoid all sugar sweetened beverages  We had you meet with [name], our clinical pharmacist today would like you to see in the next 3 weeks and return to see Dr. Delford Field 2 months  Please pick up a financial application see if we can get you the orange card and I can send you to a dental clinic because you have very severe dental disease which needs to be addressed and will help your diabetes control  I did refill your cholesterol pill at 20 mg daily please take that medication  I am going to have our clinical social worker Asante call you to help you with some counseling around her anxiety levels which may help to improve your overall health  You declined all vaccinations at this visit  Please get your eyes checked at the [facility] location we suggested an eye resource sheet was given

## 2021-08-17 NOTE — Assessment & Plan Note (Signed)
A1c greater than 12 at this visit I conferred with clinical pharmacy plan is for him to see clinical pharmacy again in 3 weeks and will discontinue Humulin and begin insulin glargine 30 units at bedtime and insulin lispro 6 units before lunch and dinner but holding if blood sugar less than 150  Patient instructed as to proper diet  Patient needs to get the orange card so he can send in for dental care  Patient given I resources to obtain an eye exam  Patient declined to receive the flu vaccine or pneumonia vaccine or tetanus vaccine

## 2021-08-17 NOTE — Assessment & Plan Note (Signed)
Resume Crestor at 20 mg daily we will recheck LDL at a later date

## 2021-08-17 NOTE — Progress Notes (Signed)
New Patient Office Visit  Subjective:  Patient ID: Debbora Dus, male    DOB: [birth year]  Age: 45 y.o. MRN: [id number]  CC:  Chief Complaint  Patient presents with   Diabetes   Hypertension    HPI Former [facility] patient here to re est PCP Saulo A Polhamus presents for Pcp to re establish. Hx CAF, T2DM, neuropathy d/t T2DM, AKI, smoker, HLD goal LDL < 70  This patient comes in today to reestablish for primary care.  The patient's blood sugars have been running in the 200 range at home and on arrival hemoglobin A1c is greater than 12.  Blood pressure is good at 127/83.  The patient has a history of paroxysmal atrial fibrillation but he has been in sinus rhythm since August.  He denies any tachycardia spells.  He states for breakfast he usually eats Cheerios he has a sandwich for lunch he will occasionally have lasagna which he makes himself with vegetables and will have not grilled chicken.  He does not eat pork or beef.  He does eat some vegetables.  He still occasionally uses sugar sweetened beverages.  Patient denies any neuropathic pains.  He has had diabetes for about 10 years.  His last LDL was greater than 110.  His goal is to have an LDL less than 70.  He has had elevated liver functions in the past this needs to be refollowed.  He has never been screened for hepatitis C.  Patient declines to receive the pneumonia tetanus and flu vaccines.  He is yet to have an eye exam.  Patient also needs a dental exam.  He does remain uninsured and he has not been able to achieve an orange card because he cannot get the information for his taxes to be able to apply for the orange card.  He works part-time at [employer].  He cannot achieve health insurance.  Patient has been on the 70/30 Humulin product twice daily at 20 units. Foot tdap pcv hcv flu   Saw PA McClung in 04/2021: Jiyaan Henningson is a 45 y.o. male here today for a follow up visit and to establish care. After overnight  hospitalization with Afib 8/6-04/25/2021.  Saw cardiology in f/up 05/05/2021.  No further CP/SOB/palpitations.  He says he feels well overall.  He has been taking 15-18 units novolog twice daily.  Says lantus didn't work for him.  He feels that his blood sugars are fine with running 200-300.  He is happy with 12.8 A1C.  Says he is 100% compliant with insulin   From discharge summary: 1.  Atrial fibrillation: s/p cardioversion; currently in NSR; CHA2Ds2-VASc score 1 - did not initiate anticoagulation during this admission; recommend following up in A.fib clinic   Diabetes mellitus with hyperglycemia: Continue with home Novolog 70/30 15U bid; follow up with PCP for insulin adjustments    2.  Labs / imaging needed at time of follow-up: None   3.  Pending labs/ test needing follow-up: None   1. Atrial fibrillation Patient presented with acute onset palpitations and diaphoresis when going up and down a flight of stairs.  He was noted to be in atrial fibrillation with heart rate in the 220s with EMS for which she was given multiple doses of adenosine and 1 dose of Cardizem.  In the emergency department, patient was also noted to be hypotensive with systolic in [id number] to 0000000 at which point he was cardioverted 3 times.  He has not had any recent  acute illnesses, no toxin exposures, pulmonary disease.  Echo without any left atrial dilatation noted.  CHA2DS2-VASc score 1, low risk for stroke, anticoagulation was not initiated during this hospitalization.  He maintained sinus rhythm for the remainder of the hospitalization. Patient is recommended to follow-up in atrial fibrillation clinic.  Referral to the clinic was placed and patient also given contact information for the atrial fibrillation clinic.   2. Diabetes mellitus with hyperglycemia  Patient has 10-year history of poorly controlled diabetes with A1c of 12%.  He reports a strong family history of insulin-dependent diabetes.  Given his young age, no other  significant risk factors, and family history, concern if this is type 1 diabetes.  He was unable to tolerate metformin in the past.  He is currently on NovoLog 70/30 15 units twice daily.  He reports that his CBGs range 150-250.  He was noted to be hyperglycemic >300, pH approximately 7.3 with bicarb of 18.  He did not have any anion gap.  However, did have an elevated beta hydroxybutyrate for which insulin drip was initiated.  Patient's beta hydroxybutyrate corrected and he was transitioned to subcutaneous insulin.  He is discharged on his NovoLog 70/30 15 to 18 units twice daily.  He is recommended to follow-up with his PCP for insulin adjustments.   A/P from cardiology 8/17:   Assessment and Plan:  1. New onset afib with RVR Successful cardioversion and remains in SR today General education re afib and triggers  Pt advised to stop marijuana use    2. CHA2DS2VASc  score of 1 (DM) Pt was not started on anticoagulation for low risk stroke score However, it is [facility] cardioversion protocol that anyone cardioverted,  despite CHA2DS2VASc score, will be placed on anticoagulation for 4 weeks afterward.  I discussed this with pt today but since it has one week since DCCV he defers start of anticoagulation.   1. Type 2 diabetes mellitus with hyperglycemia, with long-term current use of insulin ([place]) Not controlled-check blood sugars bid and record and bring to next visit with [facility].  Increase novolog to 20 bid with meals - Glucose (CBG) - HgB A1c - insulin NPH-regular Human (70-30) 100 UNIT/ML injection; Inject 20 Units into the skin 2 (two) times daily with a meal.  Dispense: 12 mL; Refill: 4 - Insulin Syringe-Needle U-100 (GLOBAL INSULIN SYRINGES) 30G X 1/2" 0.3 ML MISC; 1 each by Does not apply route 2 (two) times daily.  Dispense: 100 each; Refill: 4 - Comprehensive metabolic panel; Future   2. Hyperlipidemia LDL goal <70 - Comprehensive metabolic panel; Future - Lipid panel; Future   3. Hospital  discharge follow-up   4. Atrial fibrillation with RVR ([place]) Stable/followed by cardiology.    Last Afib visit 04/2021 Nicasio A Pivarnik is a 45 y.o. male with a h/o type 1 DM, that developed acute onset of palpitations and was found to be in new onset afib with EMS finding a HR of around 220 bpm. He was given several doses of adenosine as well as 20 mg cardizem IV and still did not break. He was then cardioverted x 3 ultimately resuming SR.    In the afib clinic, he is in a sinus tach at 101 bpm. He states he walked a long distance to the clinic and is anxious re the outcome. He reports no alcohol use, no snoring, no excessive caffeine, but does smoke marijuana occasionally. He denies smoking prior to onset of palpitations. He reports that he feels improved. Echo done  showed normal EF.    Today, he denies symptoms of palpitations, chest pain, shortness of breath, orthopnea, PND, lower extremity edema, dizziness, presyncope, syncope, or neurologic sequela. The patient is tolerating medications without difficulties and is otherwise without complaint today.   Assessment and Plan:  1. New onset afib with RVR Successful cardioversion and remains in SR today General education re afib and triggers  Pt advised to stop marijuana use    2. CHA2DS2VASc  score of 1 (DM) Pt was not started on anticoagulation for low risk stroke score However, it is [facility] cardioversion protocol that anyone cardioverted,  despite CHA2DS2VASc score, will be placed on anticoagulation for 4 weeks afterward.  I discussed this with pt today but since it has one week since DCCV he defers start of anticoagulation.     The pt never saw [facility]PP [facility]. No Cards f/u since 04/2021 as Afib clinic released the pt, was in NSR at 04/2021 OV     Past Medical History:  Diagnosis Date   AKI (acute kidney injury) (HCC) 03/21/2018   Diabetes mellitus    Newly diagnosed, May 2012    Past Surgical History:  Procedure Laterality Date    APPENDECTOMY     TONSILLECTOMY      Family History  Problem Relation Age of Onset   Hypertension Mother    Diabetes Sister        Several brothers and sister with DM   Diabetes Brother     Social History   Socioeconomic History   Marital status: Single    Spouse name: Not on file   Number of children: Not on file   Years of education: Not on file   Highest education level: Not on file  Occupational History   Not on file  Tobacco Use   Smoking status: Former    Types: Cigarettes    Quit date: 10/20/2015    Years since quitting: 5.8   Smokeless tobacco: Former    Quit date: 10/20/2015   Tobacco comments:    Former smoker 05/05/2021  Substance and Sexual Activity   Alcohol use: No    Alcohol/week: 0.0 standard drinks   Drug use: Yes    Types: Marijuana    Comment: every 1-3 days   Sexual activity: Not on file  Other Topics Concern   Not on file  Social History Narrative   Currently lives with his parents in [place]. Works at [employer]. Denies any current alcohol, tobacco or illicit drug use.         Social Determinants of Health   Financial Resource Strain: Not on file  Food Insecurity: Not on file  Transportation Needs: Not on file  Physical Activity: Not on file  Stress: Not on file  Social Connections: Not on file  Intimate Partner Violence: Not on file    ROSk Review of Systems  Constitutional: Negative.   HENT: Negative.    Eyes: Negative.   Respiratory: Negative.    Cardiovascular:  Negative for chest pain, palpitations and leg swelling.  Gastrointestinal: Negative.   Endocrine: Positive for polyphagia. Negative for polydipsia and polyuria.  Genitourinary: Negative.   Musculoskeletal: Negative.   Neurological:  Positive for light-headedness.  Psychiatric/Behavioral:  Positive for dysphoric mood. Negative for self-injury, sleep disturbance and suicidal ideas. The patient is nervous/anxious.    Objective:   Today's Vitals: BP 127/83 (BP  Location: Left Arm)   Pulse 88   Resp 16   Wt 136 lb 3.2 oz (61.8 kg)  SpO2 100%   BMI 18.47 kg/m   Physical Exam Vitals reviewed.  Constitutional:      Appearance: Normal appearance. He is well-developed. He is not diaphoretic.     Comments: thin  HENT:     Head: Normocephalic and atraumatic.     Nose: Nose normal. No nasal deformity, septal deviation, mucosal edema or rhinorrhea.     Right Sinus: No maxillary sinus tenderness or frontal sinus tenderness.     Left Sinus: No maxillary sinus tenderness or frontal sinus tenderness.     Mouth/Throat:     Mouth: Mucous membranes are moist.     Pharynx: Oropharynx is clear. No oropharyngeal exudate.     Comments: Poor dentition, periodontal disease and dental caries Eyes:     General: No scleral icterus.    Conjunctiva/sclera: Conjunctivae normal.     Pupils: Pupils are equal, round, and reactive to light.  Neck:     Thyroid: No thyromegaly.     Vascular: No carotid bruit or JVD.     Trachea: Trachea normal. No tracheal tenderness or tracheal deviation.  Cardiovascular:     Rate and Rhythm: Normal rate and regular rhythm.     Chest Wall: PMI is not displaced.     Pulses: Normal pulses. No decreased pulses.     Heart sounds: Normal heart sounds, S1 normal and S2 normal. Heart sounds not distant. No murmur heard. No systolic murmur is present.  No diastolic murmur is present.    No friction rub. No gallop. No S3 or S4 sounds.  Pulmonary:     Effort: Pulmonary effort is normal. No tachypnea, accessory muscle usage or respiratory distress.     Breath sounds: Normal breath sounds. No stridor. No decreased breath sounds, wheezing, rhonchi or rales.  Chest:     Chest wall: No tenderness.  Abdominal:     General: Bowel sounds are normal. There is no distension.     Palpations: Abdomen is soft. Abdomen is not rigid.     Tenderness: There is no abdominal tenderness. There is no guarding or rebound.  Musculoskeletal:        General:  Normal range of motion.     Cervical back: Normal range of motion and neck supple. No edema, erythema or rigidity. No muscular tenderness. Normal range of motion.     Comments: Foot exam normal  Lymphadenopathy:     Head:     Right side of head: No submental or submandibular adenopathy.     Left side of head: No submental or submandibular adenopathy.     Cervical: No cervical adenopathy.  Skin:    General: Skin is warm and dry.     Coloration: Skin is not pale.     Findings: No rash.     Nails: There is no clubbing.  Neurological:     General: No focal deficit present.     Mental Status: He is alert and oriented to person, place, and time. Mental status is at baseline.     Sensory: No sensory deficit.  Psychiatric:        Mood and Affect: Mood normal.        Speech: Speech normal.        Behavior: Behavior normal.        Thought Content: Thought content normal.        Judgment: Judgment normal.   Echo 04/2021 1. Left ventricular ejection fraction, by estimation, is 55 to 60%. The  left ventricle has normal function.  The left ventricle has no regional  wall motion abnormalities. There is mild left ventricular hypertrophy.  Indeterminate diastolic filling due to  E-A fusion.   2. Right ventricular systolic function is normal. The right ventricular  size is normal. Tricuspid regurgitation signal is inadequate for assessing  PA pressure.   3. A small pericardial effusion is present. There is no evidence of  cardiac tamponade.   4. The mitral valve is normal in structure. Trivial mitral valve  regurgitation. No evidence of mitral stenosis.   5. The aortic valve was not well visualized. There is mild calcification  of the aortic valve. Aortic valve regurgitation is not visualized. No  aortic stenosis is present.   6. Aortic dilatation noted. There is mild dilatation of the aortic root,  measuring 39 mm.   7. The inferior vena cava is normal in size with greater than 50%   respiratory variability, suggesting right atrial pressure of 3 mmHg.  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 ([place])    Encouraged to apply and obtain the orange card so can send him for dental care      Relevant Medications   rosuvastatin (CRESTOR) 20 MG tablet   insulin lispro (HUMALOG) 100 UNIT/ML KwikPen   Insulin Glargine (BASAGLAR KWIKPEN) 100 UNIT/ML   Dental caries    Several of the molars are worn down with significant cavity formation as per periodontal disease assessment        Endocrine   Diabetic neuropathy (HCC)    Currently stable at this time      Relevant Medications   rosuvastatin (CRESTOR) 20 MG tablet   insulin lispro (HUMALOG) 100 UNIT/ML KwikPen   Insulin Glargine (BASAGLAR KWIKPEN) 100 UNIT/ML   Type 2 diabetes mellitus with hyperglycemia, with long-term current use of insulin (HCC) - Primary    A1c greater than 12 at this visit I conferred with clinical pharmacy plan is for him to see clinical pharmacy again in 3 weeks and will discontinue Humulin and begin insulin glargine 30 units at bedtime and insulin lispro 6 units before lunch and dinner but holding if blood sugar less than 150  Patient instructed as to proper diet  Patient needs to get the orange card so he can send in for dental care  Patient given I resources to obtain an eye exam  Patient declined to receive the flu vaccine or pneumonia vaccine or tetanus vaccine      Relevant Medications   rosuvastatin (CRESTOR) 20 MG tablet   insulin lispro (HUMALOG) 100 UNIT/ML KwikPen   Insulin Glargine (BASAGLAR KWIKPEN) 100 UNIT/ML   Other Relevant Orders   POCT glucose (manual entry) (Completed)   POCT glycosylated hemoglobin (Hb A1C) (Completed)   Comprehensive metabolic panel     Genitourinary   RESOLVED: AKI (acute kidney injury) ([place])    History of acute kidney injury has resolved        Other    History of smoking    Not currently smoking advised to avoid      Hyperlipidemia LDL goal <70    Resume Crestor at 20 mg daily we will recheck LDL at a later date      Relevant Medications   rosuvastatin (CRESTOR) 20 MG tablet   History of atrial fibrillation    Currently in sinus rhythm not on any maintenance medications will observe for now  Other Visit Diagnoses     Need for hepatitis C screening test       Relevant Orders   HCV Ab w Reflex to Quant PCR       Outpatient Encounter Medications as of 08/17/2021  Medication Sig   Insulin Glargine (BASAGLAR KWIKPEN) 100 UNIT/ML Inject 30 Units into the skin daily.   insulin lispro (HUMALOG) 100 UNIT/ML KwikPen Inject 6 Units into the skin 2 (two) times daily before a meal. Before lunch and dinner, hold if glucose is less than 150   [DISCONTINUED] insulin isophane & regular human KwikPen (HUMULIN 70/30 MIX) (70-30) 100 UNIT/ML KwikPen Inject 20 Units into the skin 2 (two) times daily with a meal.   [DISCONTINUED] Insulin Pen Needle 31G X 5 MM MISC used to inject insulin 2 (two) times daily.   [DISCONTINUED] Insulin Syringe-Needle U-100 (GLOBAL INJECT EASE INSULIN SYR) 30G X 5/16" 0.3 ML MISC use 1 syringe into the skin 2 (two) times daily.   Insulin Pen Needle 31G X 5 MM MISC Use to inject insulin 2 (two) times daily.   rosuvastatin (CRESTOR) 20 MG tablet Take 1 tablet (20 mg total) by mouth daily. To lower cholesterol   [DISCONTINUED] rosuvastatin (CRESTOR) 10 MG tablet Take 1 tablet (10 mg total) by mouth daily. To lower cholesterol (Patient not taking: Reported on 08/17/2021)   No facility-administered encounter medications on file as of 08/17/2021.   36 minutes spent reviewing history and physical with the patient documenting note patient education high complex decision making collaborating with clinical pharmacy Follow-up: Return in about 2 months (around 10/17/2021).   Asencion Noble, MD

## 2021-08-17 NOTE — Assessment & Plan Note (Signed)
Several of the molars are worn down with significant cavity formation as per periodontal disease assessment

## 2021-08-17 NOTE — Assessment & Plan Note (Signed)
Not currently smoking advised to avoid

## 2021-08-17 NOTE — Assessment & Plan Note (Signed)
Currently in sinus rhythm not on any maintenance medications will observe for now

## 2021-08-18 ENCOUNTER — Telehealth: Payer: Self-pay

## 2021-08-18 LAB — COMPREHENSIVE METABOLIC PANEL
ALT: 41 IU/L (ref 0–44)
AST: 27 IU/L (ref 0–40)
Albumin/Globulin Ratio: 1.8 (ref 1.2–2.2)
Albumin: 4.3 g/dL (ref 4.0–5.0)
Alkaline Phosphatase: 110 IU/L (ref 44–121)
BUN/Creatinine Ratio: 17 (ref 9–20)
BUN: 24 mg/dL (ref 6–24)
Bilirubin Total: <0.2 mg/dL (ref 0.0–1.2)
CO2: 22 mmol/L (ref 20–29)
Calcium: 9.3 mg/dL (ref 8.7–10.2)
Chloride: 103 mmol/L (ref 96–106)
Creatinine, Ser: 1.45 mg/dL — ABNORMAL HIGH (ref 0.76–1.27)
Globulin, Total: 2.4 g/dL (ref 1.5–4.5)
Glucose: 240 mg/dL — ABNORMAL HIGH (ref 70–99)
Potassium: 4.3 mmol/L (ref 3.5–5.2)
Sodium: 138 mmol/L (ref 134–144)
Total Protein: 6.7 g/dL (ref 6.0–8.5)
eGFR: 61 mL/min/{1.73_m2} (ref >59)

## 2021-08-18 LAB — HCV INTERPRETATION

## 2021-08-18 LAB — HCV AB W REFLEX TO QUANT PCR: HCV Ab: <0.1 s/co ratio (ref 0.0–0.9)

## 2021-08-18 NOTE — Telephone Encounter (Signed)
Pt was called and is aware of results, DOB was confirmed.  ?

## 2021-08-18 NOTE — Telephone Encounter (Signed)
-----   Message from Hoy Register, MD sent at 08/18/2021 12:15 PM EST ----- Please inform him that his screen for hepatitis C is negative, liver function is normal, glucose is elevated and he would need to comply with the diabetic regimen discussed with Dr. Delford Field at his visit yesterday.  Kidney function is slightly abnormal but stable compared to previous labs.  Please advised to use NSAIDs sparingly as this can worsen kidney function.

## 2021-08-27 ENCOUNTER — Ambulatory Visit: Payer: Self-pay | Admitting: *Deleted

## 2021-08-27 NOTE — Telephone Encounter (Addendum)
See triage notes.  Pt has a question regarding his insulin before he goes to work at 3:00 today.   Pt said he has already called Dr. Delford Field and left him a message about this too before calling me.  I have sent a Teams message to Sabino Snipes, clinical at [facility].  I also tried to call in but no answer at [facility].

## 2021-08-27 NOTE — Telephone Encounter (Signed)
Reason for Disposition  [1] Caller has URGENT medication or insulin pump question AND [2] triager unable to answer question  Answer Assessment - Initial Assessment Questions 1. SYMPTOMS: "What symptoms are you concerned about?"     Pt calling in.   My glucose is 199.   I haven't taken my medicine yet.  I'm about to eat something but my number is low.   I'm nervous about eating and then going to work and ending up at the hospital.   I don't know what to expect.   I work in [employer].   The first 2 days I've done fine.   But today I feel like my sugar is low even though I haven't taken any of my medicine yet. 2. ONSET:  "When did the symptoms start?"     Today 3. BLOOD GLUCOSE: "What is your blood glucose level?"      259 now that's high for me.   My symptoms I'm sweating, I feel off, I feel fine otherwise.   4. USUAL RANGE: "What is your blood glucose level usually?" (e.g., usual fasting morning value, usual evening value)     152 to 180 is about my normal  Do I take the old medicine because it works and I have to go to work or what?  5. TYPE 1 or 2:  "Do you know what type of diabetes you have?"  (e.g., Type 1, Type 2, Gestational; doesn't know)      Not asked 6. INSULIN: "Do you take insulin?" "What type of insulin(s) do you use? What is the mode of delivery? (syringe, pen; injection or pump) "When did you last give yourself an insulin dose?" (i.e., time or hours/minutes ago) "How much did you give?" (i.e., how many units)     Yes New for me:  Insulin Glarine 30 units SubQ And Insulin Lispro 6 units "after every meal".   7. DIABETES PILLS: "Do you take any pills for your diabetes?"     Not asked 8. OTHER SYMPTOMS: "Do you have any symptoms?" (e.g., fever, frequent urination, difficulty breathing, vomiting)     He is feeling like his sugar is low, sweating and just feeling off even though his glucose is elevated.   He checked it while on the phone with me again to double check. 9. LOW  BLOOD GLUCOSE TREATMENT: "What have you done so far to treat the low blood glucose level?"     Nothing.   I don't know if I should take my new medicine insulin or just take my old medicine since I'm going to work at 3:00 today.   I don't want to get to work and have a problem with my sugar and all.    10. FOOD: "When did you last eat or drink?"       Not asked 11. ALONE: "Are you alone right now or is someone with you?"        Not aske 12. PREGNANCY: "Is there any chance you are pregnant?" "When was your last menstrual period?"       N/A  Protocols used: Diabetes - Low Blood Sugar-A-AH

## 2021-08-30 NOTE — Telephone Encounter (Signed)
Called pt stated he spoke with the MD .

## 2021-09-07 ENCOUNTER — Ambulatory Visit: Payer: Self-pay | Attending: Critical Care Medicine | Admitting: Pharmacist

## 2021-09-07 ENCOUNTER — Other Ambulatory Visit: Payer: Self-pay

## 2021-09-07 DIAGNOSIS — Z794 Long term (current) use of insulin: Secondary | ICD-10-CM

## 2021-09-07 DIAGNOSIS — E1165 Type 2 diabetes mellitus with hyperglycemia: Secondary | ICD-10-CM

## 2021-09-07 NOTE — Progress Notes (Signed)
° ° °  S:    PCP: Dr. Delford Field  No chief complaint on file.  Patient arrives in good spirits. Presents for diabetes management at the request of Dr. Delford Field. Patient was referred on 08/17/2021. A1c at that visit found to be 12.7. It has been >12 at least since 07/12/2018. At his PCP visit, his 70/30 NPH insulin was changed to glargine + lispro.   Patient has a history of non-compliance. He could not tolerate metformin in the past. I have seen him before and he was reluctant to increase his insulin dose secondary to relative hypoglycemia. Today, he reports adjusting well to the new insulin regimen but admits that it was an adjustment at first.   Family/Social History:  - FHx: DM (3 siblings) - Tobacco: 1 cigarette q2weeks - Alcohol: denies  Insurance coverage/medication affordability:  - Self-pay  Patient reports adherence with medications, however, he had to decrease his Basaglar dose d/t relative hypoglycemia.   Current diabetes medications include:  - Basaglar 20 units daily  - Humalog 6 units BID before meals   Patient denies objective hypoglycemia. Endorses relative hypoglycemia. Is aware when sugars are in the 100s.   Patient reported dietary habits:  - Occasionally drinks sweet tea, orange juice - Pt reports trying to do better with carbohydrates   Patient-reported exercise habits:  - Mostly active at work  - No exercise outside of work    Patient denies polyuria.  Patient denies neuropathy. Patient denies visual changes.  O:  Home glucose levels: reports 118 - 250s since starting the new insulin. Denies any readings >300.   Lab Results  Component Value Date   HGBA1C 12.7 (A) 08/17/2021   There were no vitals filed for this visit.  Lipid Panel     Component Value Date/Time   CHOL 183 05/14/2021 1357   TRIG 106 05/14/2021 1357   HDL 54 05/14/2021 1357   CHOLHDL 3.4 05/14/2021 1357   CHOLHDL 4.0 04/09/2014 1106   VLDL 18 04/09/2014 1106   LDLCALC 110 (H)  05/14/2021 1357   Clinical ASCVD: No   A/P: Diabetes longstanding currently uncontrolled. Patient is able to verbalize appropriate hypoglycemia management plan. Patient is adherent with medication.  -Inc dose of Basaglar to 24 units daily.  -Continue Humalog 6 units BID before meals. Hold dose if CBG <150.   -Extensively discussed pathophysiology of DM, recommended lifestyle interventions, dietary effects on glycemic control -Counseled on s/sx of and management of hypoglycemia -Next A1C anticipated 10/2021.   Written patient instructions provided.  Total time in face to face counseling 15 minutes.  Follow up Pharmacist Clinic Visit 2-3 weeks.    Butch Penny, PharmD, CPP Clinical Pharmacist [facility] [phone]

## 2021-10-09 NOTE — Progress Notes (Unsigned)
° °  S:    PCP: Dr. Delford Field  Patient presents for diabetes management at the request of Dr. Delford Field. Patient was referred on 08/17/21. A1c at that visit found to be 12.7. It has been >12 at least since 07/12/2018. At his PCP visit, his 70/30 NPH insulin was changed to insulin glargine + lispro. At CPP visit on 09/07/21, Basaglar was increased.   Today, *** -adherence?  -relative hypo?  -update basaglar rx in epic - incr to 26 if needed  Patient has a history of non-compliance. He could not tolerate metformin in the past. I have seen him before and he was reluctant to increase his insulin dose secondary to relative hypoglycemia.   Family/Social History:  - FHx: DM (3 siblings) - Tobacco: 1 cigarette q2weeks - Alcohol: denies  Insurance coverage/medication affordability:  - Self-pay  Patient reports adherence with medications.  Current diabetes medications include: Basaglar 24 units daily, Humalog 6 units BID before meals (hold dose if BG <150 before meal)  Patient *** denies objective hypoglycemia. Endorses relative hypoglycemia. Is aware when sugars are in the 100s.   Patient reported dietary habits:  - Occasionally drinks sweet tea, orange juice - Pt reports trying to do better with carbohydrates   Patient-reported exercise habits:  - Mostly active at work  - No exercise outside of work    Patient denies polyuria.  Patient denies neuropathy. Patient denies visual changes.  O:  Home glucose levels: *** reports 118 - 250s since starting the new insulin. Denies any readings >300.   Lab Results  Component Value Date   HGBA1C 12.7 (A) 08/17/2021   There were no vitals filed for this visit.  Lipid Panel     Component Value Date/Time   CHOL 183 05/14/2021 1357   TRIG 106 05/14/2021 1357   HDL 54 05/14/2021 1357   CHOLHDL 3.4 05/14/2021 1357   CHOLHDL 4.0 04/09/2014 1106   VLDL 18 04/09/2014 1106   LDLCALC 110 (H) 05/14/2021 1357   Clinical ASCVD: No   A/P: Diabetes  longstanding currently uncontrolled. Patient is able to verbalize appropriate hypoglycemia management plan. Patient is adherent with medication.  -Increase dose of Basaglar to 24 units daily.  -Continue Humalog 6 units BID before meals. Hold dose if CBG <150.   -Extensively discussed pathophysiology of DM, recommended lifestyle interventions, dietary effects on glycemic control -Counseled on s/sx of and management of hypoglycemia -Next A1C anticipated 10/2021.   Written patient instructions provided.  Total time in face to face counseling 15 minutes.  Follow up PCP visit scheduled for 1 week.

## 2021-10-11 ENCOUNTER — Ambulatory Visit: Payer: Self-pay | Attending: Critical Care Medicine | Admitting: Pharmacist

## 2021-10-14 ENCOUNTER — Other Ambulatory Visit: Payer: Self-pay

## 2021-10-16 NOTE — Progress Notes (Deleted)
Established Patient Office Visit  Subjective:  Patient ID: [name], male    DOB: 08/19/1976  Age: 46 y.o. MRN: [id number]  CC: No chief complaint on file.   HPI [name] presents for  08/17/21 Former [place] patient here to re est PCP [facility] presents for Pcp to re establish. Hx CAF, T2DM, neuropathy d/t T2DM, AKI, smoker, HLD goal LDL < 70   This patient comes in today to reestablish for primary care.  The patient's blood sugars have been running in the 200 range at home and on arrival hemoglobin A1c is greater than 12.  Blood pressure is good at 127/83.  The patient has a history of paroxysmal atrial fibrillation but he has been in sinus rhythm since August.  He denies any tachycardia spells.  He states for breakfast he usually eats Cheerios he has a sandwich for lunch he will occasionally have lasagna which he makes himself with vegetables and will have not grilled chicken.  He does not eat pork or beef.  He does eat some vegetables.  He still occasionally uses sugar sweetened beverages.   Patient denies any neuropathic pains.  He has had diabetes for about 10 years.  His last LDL was greater than 110.  His goal is to have an LDL less than 70.  He has had elevated liver functions in the past this needs to be refollowed.  He has never been screened for hepatitis C.  Patient declines to receive the pneumonia tetanus and flu vaccines.  He is yet to have an eye exam.   Patient also needs a dental exam.  He does remain uninsured and he has not been able to achieve an orange card because he cannot get the information for his taxes to be able to apply for the orange card.  He works part-time at [employer].  He cannot achieve health insurance.   Patient has been on the 70/30 Humulin product twice daily at 20 units. Foot tdap pcv hcv flu    Saw PA McClung in 04/2021: [name] is a 46 y.o. male here today for a follow up visit and to establish care. After overnight  hospitalization with Afib 8/6-04/25/2021.  Saw cardiology in f/up 05/05/2021.  No further CP/SOB/palpitations.  He says he feels well overall.  He has been taking 15-18 units novolog twice daily.  Says lantus didn't work for him.  He feels that his blood sugars are fine with running 200-300.  He is happy with 12.8 A1C.  Says he is 100% compliant with insulin   From discharge summary: 1.  Atrial fibrillation: s/p cardioversion; currently in NSR; CHA2Ds2-VASc score 1 - did not initiate anticoagulation during this admission; recommend following up in A.fib clinic   Diabetes mellitus with hyperglycemia: Continue with home Novolog 70/30 15U bid; follow up with PCP for insulin adjustments    2.  Labs / imaging needed at time of follow-up: None   3.  Pending labs/ test needing follow-up: None   1. Atrial fibrillation Patient presented with acute onset palpitations and diaphoresis when going up and down a flight of stairs.  He was noted to be in atrial fibrillation with heart rate in the 220s with EMS for which she was given multiple doses of adenosine and 1 dose of Cardizem.  In the emergency department, patient was also noted to be hypotensive with systolic in 87O to 67E at which point he was cardioverted 3 times.  He has not  had any recent acute illnesses, no toxin exposures, pulmonary disease.  Echo without any left atrial dilatation noted.  CHA2DS2-VASc score 1, low risk for stroke, anticoagulation was not initiated during this hospitalization.  He maintained sinus rhythm for the remainder of the hospitalization. Patient is recommended to follow-up in atrial fibrillation clinic.  Referral to the clinic was placed and patient also given contact information for the atrial fibrillation clinic.   2. Diabetes mellitus with hyperglycemia  Patient has 10-year history of poorly controlled diabetes with A1c of 12%.  He reports a strong family history of insulin-dependent diabetes.  Given his young age, no other  significant risk factors, and family history, concern if this is type 1 diabetes.  He was unable to tolerate metformin in the past.  He is currently on NovoLog 70/30 15 units twice daily.  He reports that his CBGs range 150-250.  He was noted to be hyperglycemic >300, pH approximately 7.3 with bicarb of 18.  He did not have any anion gap.  However, did have an elevated beta hydroxybutyrate for which insulin drip was initiated.  Patient's beta hydroxybutyrate corrected and he was transitioned to subcutaneous insulin.  He is discharged on his NovoLog 70/30 15 to 18 units twice daily.  He is recommended to follow-up with his PCP for insulin adjustments.   A/P from cardiology 8/17:   Assessment and Plan:  1. New onset afib with RVR Successful cardioversion and remains in SR today General education re afib and triggers  Pt advised to stop marijuana use    2. CHA2DS2VASc  score of 1 (DM) Pt was not started on anticoagulation for low risk stroke score However, it is [facility] cardioversion protocol that anyone cardioverted,  despite CHA2DS2VASc score, will be placed on anticoagulation for 4 weeks afterward.  I discussed this with pt today but since it has one week since DCCV he defers start of anticoagulation.    1. Type 2 diabetes mellitus with hyperglycemia, with long-term current use of insulin ([place]) Not controlled-check blood sugars bid and record and bring to next visit with [facility].  Increase novolog to 20 bid with meals - Glucose (CBG) - HgB A1c - insulin NPH-regular Human (70-30) 100 UNIT/ML injection; Inject 20 Units into the skin 2 (two) times daily with a meal.  Dispense: 12 mL; Refill: 4 - Insulin Syringe-Needle U-100 (GLOBAL INSULIN SYRINGES) 30G X 1/2" 0.3 ML MISC; 1 each by Does not apply route 2 (two) times daily.  Dispense: 100 each; Refill: 4 - Comprehensive metabolic panel; Future   2. Hyperlipidemia LDL goal <70 - Comprehensive metabolic panel; Future - Lipid panel; Future   3.  Hospital discharge follow-up   4. Atrial fibrillation with RVR ([place]) Stable/followed by cardiology.     Last Afib visit 04/2021 Lennie A Schonberg is a 46 y.o. male with a h/o type 1 DM, that developed acute onset of palpitations and was found to be in new onset afib with EMS finding a HR of around 220 bpm. He was given several doses of adenosine as well as 20 mg cardizem IV and still did not break. He was then cardioverted x 3 ultimately resuming SR.    In the afib clinic, he is in a sinus tach at 101 bpm. He states he walked a long distance to the clinic and is anxious re the outcome. He reports no alcohol use, no snoring, no excessive caffeine, but does smoke marijuana occasionally. He denies smoking prior to onset of palpitations. He reports that  he feels improved. Echo done showed normal EF.    Today, he denies symptoms of palpitations, chest pain, shortness of breath, orthopnea, PND, lower extremity edema, dizziness, presyncope, syncope, or neurologic sequela. The patient is tolerating medications without difficulties and is otherwise without complaint today.    Assessment and Plan:  1. New onset afib with RVR Successful cardioversion and remains in SR today General education re afib and triggers  Pt advised to stop marijuana use    2. CHA2DS2VASc  score of 1 (DM) Pt was not started on anticoagulation for low risk stroke score However, it is [facility] cardioversion protocol that anyone cardioverted,  despite CHA2DS2VASc score, will be placed on anticoagulation for 4 weeks afterward.  I discussed this with pt today but since it has one week since DCCV he defers start of anticoagulation.     The pt never saw CPP [facility]. 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 ([place])      Encouraged to apply and obtain the orange card so can send him for dental care        Relevant Medications     rosuvastatin (CRESTOR) 20 MG tablet    insulin lispro (HUMALOG) 100 UNIT/ML KwikPen    Insulin Glargine (BASAGLAR KWIKPEN) 100 UNIT/ML    Dental caries      Several of the molars are worn down with significant cavity formation as per periodontal disease assessment            Endocrine    Diabetic neuropathy ([place])      Currently stable at this time        Relevant Medications    rosuvastatin (CRESTOR) 20 MG tablet    insulin lispro (HUMALOG) 100 UNIT/ML KwikPen    Insulin Glargine (BASAGLAR KWIKPEN) 100 UNIT/ML    Type 2 diabetes mellitus with hyperglycemia, with long-term current use of insulin ([facility]) - Primary      A1c greater than 12 at this visit I conferred with clinical pharmacy plan is for him to see clinical pharmacy again in 3 weeks and will discontinue Humulin and begin insulin glargine 30 units at bedtime and insulin lispro 6 units before lunch and dinner but holding if blood sugar less than 150   Patient instructed as to proper diet   Patient needs to get the orange card so he can send in for dental care   Patient given I resources to obtain an eye exam   Patient declined to receive the flu vaccine or pneumonia vaccine or tetanus vaccine        Relevant Medications    rosuvastatin (CRESTOR) 20 MG tablet    insulin lispro (HUMALOG) 100 UNIT/ML KwikPen    Insulin Glargine (BASAGLAR KWIKPEN) 100 UNIT/ML    Other Relevant Orders    POCT glucose (manual entry) (Completed)    POCT glycosylated hemoglobin (Hb A1C) (Completed)    Comprehensive metabolic panel        Genitourinary    RESOLVED: AKI (acute kidney injury) ([place])      History of acute kidney injury has resolved            Other    History of smoking      Not currently smoking advised to avoid        Hyperlipidemia LDL goal <70      Resume Crestor at 20 mg  daily we will recheck LDL at a later date        Relevant Medications    rosuvastatin (CRESTOR) 20 MG tablet    History of atrial fibrillation       Currently in sinus rhythm not on any maintenance medications will observe for now        Other Visit Diagnoses       Need for hepatitis C screening test        Relevant Orders    HCV Ab w Reflex to Quant PCR    eye colon  Saw [facility]uke:  Patient reports adherence with medications, however, he had to decrease his Basaglar dose d/t relative hypoglycemia.   Current diabetes medications include:  - Basaglar 20 units daily  - Humalog 6 units BID before meals    Patient denies objective hypoglycemia. Endorses relative hypoglycemia. Is aware when sugars are in the 100s.    Patient reported dietary habits:  - Occasionally drinks sweet tea, orange juice - Pt reports trying to do better with carbohydrates    Patient-reported exercise habits:  - Mostly active at work  - No exercise outside of work    Patient denies polyuria.  Patient denies neuropathy. Patient denies visual changes.   O:  Home glucose levels: reports 118 - 250s since starting the new insulin. Denies any readings >300.    Recent Labs       Lab Results  Component Value Date    HGBA1C 12.7 (A) 08/17/2021      There were no vitals filed for this visit.   Lipid Panel  Labs (Brief)          Component Value Date/Time    CHOL 183 05/14/2021 1357    TRIG 106 05/14/2021 1357    HDL 54 05/14/2021 1357    CHOLHDL 3.4 05/14/2021 1357    CHOLHDL 4.0 04/09/2014 1106    VLDL 18 04/09/2014 1106    LDLCALC 110 (H) 05/14/2021 1357      Clinical ASCVD: No    A/P: Diabetes longstanding currently uncontrolled. Patient is able to verbalize appropriate hypoglycemia management plan. Patient is adherent with medication.  -Inc dose of Basaglar to 24 units daily.  -Continue Humalog 6 units BID before meals. Hold dose if CBG <150.   -Extensively discussed pathophysiology of DM, recommended lifestyle interventions, dietary effects on glycemic control -Counseled on s/sx of and management of hypoglycemia -Next A1C anticipated  10/2021.     Past Medical History:  Diagnosis Date   AKI (acute kidney injury) ([place]) 03/21/2018   Diabetes mellitus    Newly diagnosed, May 2012    Past Surgical History:  Procedure Laterality Date   APPENDECTOMY     TONSILLECTOMY      Family History  Problem Relation Age of Onset   Hypertension Mother    Diabetes Sister        Several brothers and sister with DM   Diabetes Brother     Social History   Socioeconomic History   Marital status: Single    Spouse name: Not on file   Number of children: Not on file   Years of education: Not on file   Highest education level: Not on file  Occupational History   Not on file  Tobacco Use   Smoking status: Former    Types: Cigarettes    Quit date: 10/20/2015    Years since quitting: 5.9   Smokeless tobacco: Former    Quit date: 10/20/2015   Tobacco  comments:    Former smoker 05/05/2021  Substance and Sexual Activity   Alcohol use: No    Alcohol/week: 0.0 standard drinks   Drug use: Yes    Types: Marijuana    Comment: every 1-3 days   Sexual activity: Not on file  Other Topics Concern   Not on file  Social History Narrative   Currently lives with his parents in [place]. Works at [employer]. Denies any current alcohol, tobacco or illicit drug use.         Social Determinants of Health   Financial Resource Strain: Not on file  Food Insecurity: Not on file  Transportation Needs: Not on file  Physical Activity: Not on file  Stress: Not on file  Social Connections: Not on file  Intimate Partner Violence: Not on file    Outpatient Medications Prior to Visit  Medication Sig Dispense Refill   Insulin Glargine (BASAGLAR KWIKPEN) 100 UNIT/ML Inject 30 Units into the skin daily. 12 mL 4   insulin lispro (HUMALOG) 100 UNIT/ML KwikPen Inject 6 Units into the skin 2 (two) times daily before a meal. Before lunch and dinner, hold if glucose is less than 150 6 mL 4   Insulin Pen Needle 31G X 5 MM MISC Use to inject insulin 2  (two) times daily. 100 each 3   rosuvastatin (CRESTOR) 20 MG tablet Take 1 tablet (20 mg total) by mouth daily. To lower cholesterol 60 tablet 4   No facility-administered medications prior to visit.    No Known Allergies  ROS Review of Systems    Objective:    Physical Exam  There were no vitals taken for this visit. Wt Readings from Last 3 Encounters:  08/17/21 136 lb 3.2 oz (61.8 kg)  05/06/21 131 lb (59.4 kg)  05/05/21 132 lb 9.6 oz (60.1 kg)     Health Maintenance Due  Topic Date Due   OPHTHALMOLOGY EXAM  Never done   COLONOSCOPY (Pts 45-78yr Insurance coverage will need to be confirmed)  Never done    There are no preventive care reminders to display for this patient.  Lab Results  Component Value Date   TSH 1.125 04/24/2021   Lab Results  Component Value Date   WBC 10.3 04/25/2021   HGB 10.7 (L) 04/25/2021   HCT 33.7 (L) 04/25/2021   MCV 79.3 (L) 04/25/2021   PLT 228 04/25/2021   Lab Results  Component Value Date   NA 138 08/17/2021   K 4.3 08/17/2021   CO2 22 08/17/2021   GLUCOSE 240 (H) 08/17/2021   BUN 24 08/17/2021   CREATININE 1.45 (H) 08/17/2021   BILITOT <0.2 08/17/2021   ALKPHOS 110 08/17/2021   AST 27 08/17/2021   ALT 41 08/17/2021   PROT 6.7 08/17/2021   ALBUMIN 4.3 08/17/2021   CALCIUM 9.3 08/17/2021   ANIONGAP 6 04/25/2021   EGFR 61 08/17/2021   Lab Results  Component Value Date   CHOL 183 05/14/2021   Lab Results  Component Value Date   HDL 54 05/14/2021   Lab Results  Component Value Date   LDLCALC 110 (H) 05/14/2021   Lab Results  Component Value Date   TRIG 106 05/14/2021   Lab Results  Component Value Date   CHOLHDL 3.4 05/14/2021   Lab Results  Component Value Date   HGBA1C 12.7 (A) 08/17/2021      Assessment & Plan:   Problem List Items Addressed This Visit   None   No orders of the defined  types were placed in this encounter.   Follow-up: No follow-ups on file.    Asencion Noble, MD

## 2021-10-18 ENCOUNTER — Ambulatory Visit: Payer: Self-pay | Admitting: Critical Care Medicine

## 2021-10-29 NOTE — Progress Notes (Signed)
° °  S:    PCP: Dr. Delford Field  Patient presents for diabetes management at the request of Dr. Delford Field. Patient was referred on 08/17/21. A1c at that visit found to be 12.7. A1c has been >12 at least since 07/12/2018. At his PCP visit, his 70/30 NPH insulin was changed to insulin glargine + lispro. At CPP visit on 09/07/21, Basaglar was increased. Patient has since no showed both me and Dr. Delford Field.   Today, patient arrives in good spirits and reports his BG are much improved. He takes 20-25 units of Basaglar daily. He takes 23 or 25 units if BG is in the 200s, otherwise takes 20 units. He has mostly only been taking Humalog once daily as his BG is <150 before dinner so he skips it as instructed. He feels that this regimen is working well for his BG and reports his body is still getting used to the feeling of having normal BG.   Patient has a history of non-compliance. He could not tolerate metformin in the past. I have seen him before and he was reluctant to increase his insulin dose secondary to relative hypoglycemia.   Family/Social History:  - FHx: DM (3 siblings) - Tobacco: 1 cigarette q2weeks - Alcohol: denies  Insurance coverage/medication affordability:  - Self-pay  Patient reports adherence with medications.  Current diabetes medications include: Basaglar 24 units daily (taking 20-25 units daily), Humalog 6 units BID before meals (hold dose if BG <150 before meal)  Patient reports two hypoglycemic events with readings 69 and 72. Treated appropriately with orange juice and rechecked BG which came up.   Patient reported dietary habits:  - Occasionally drinks sweet tea, orange juice - Reports eating more vegetables, trying to avoid eating out as much  Patient-reported exercise habits:  - Mostly active at work  - No formal exercise outside of work   Patient denies polyuria.  Patient denies neuropathy. Patient denies visual changes.  O:  Home glucose levels: 130s-170s. Occasional  readings over >200. Denies any readings >300.   Lab Results  Component Value Date   HGBA1C 12.7 (A) 08/17/2021   There were no vitals filed for this visit.  Lipid Panel     Component Value Date/Time   CHOL 183 05/14/2021 1357   TRIG 106 05/14/2021 1357   HDL 54 05/14/2021 1357   CHOLHDL 3.4 05/14/2021 1357   CHOLHDL 4.0 04/09/2014 1106   VLDL 18 04/09/2014 1106   LDLCALC 110 (H) 05/14/2021 1357   Clinical ASCVD: No   A/P: Diabetes longstanding currently uncontrolled. Patient is able to verbalize appropriate hypoglycemia management plan. Patient is adherent with medication. Per patient report of BG readings, current regimen appears to be controlling his BG well. Due for A1c at the end of this month. Needs to be seen by PCP after missed appointment.  -Continue current insulin regimen  -Extensively discussed pathophysiology of DM, recommended lifestyle interventions, dietary effects on glycemic control -Counseled on s/sx of and management of hypoglycemia -Next A1C anticipated at next visit with PCP.   Written patient instructions provided. Total time in face to face counseling 15 minutes. Follow up PCP visit next available.   Pervis Hocking, PharmD PGY2 Ambulatory Care Pharmacy Resident 11/01/2021 3:50 PM

## 2021-11-01 ENCOUNTER — Other Ambulatory Visit: Payer: Self-pay

## 2021-11-01 ENCOUNTER — Encounter: Payer: Self-pay | Admitting: Pharmacist

## 2021-11-01 ENCOUNTER — Ambulatory Visit: Payer: Self-pay | Attending: Critical Care Medicine | Admitting: Pharmacist

## 2021-11-01 DIAGNOSIS — E1165 Type 2 diabetes mellitus with hyperglycemia: Secondary | ICD-10-CM

## 2021-11-01 DIAGNOSIS — Z794 Long term (current) use of insulin: Secondary | ICD-10-CM

## 2021-11-01 MED ORDER — BASAGLAR KWIKPEN 100 UNIT/ML ~~LOC~~ SOPN
24.0000 [IU] | PEN_INJECTOR | Freq: Every day | SUBCUTANEOUS | 4 refills | Status: DC
  Filled 2021-11-01 – 2021-12-17 (×2): qty 12, 50d supply, fill #0
  Filled 2022-02-07: qty 12, 50d supply, fill #1
  Filled 2022-03-18: qty 12, 50d supply, fill #2

## 2021-11-24 ENCOUNTER — Other Ambulatory Visit: Payer: Self-pay

## 2021-11-29 NOTE — Progress Notes (Incomplete)
New Patient Office Visit  Subjective:  Patient ID: [name], male    DOB: November 12, 1975  Age: 46 y.o. MRN: [id number]  CC:  No chief complaint on file.   HPI Former FULP patient here to re est PCP 08/17/21 [name] presents for Pcp to re establish. Hx CAF, T2DM, neuropathy d/t T2DM, AKI, smoker, HLD goal LDL < 70  This patient comes in today to reestablish for primary care.  The patient's blood sugars have been running in the 200 range at home and on arrival hemoglobin A1c is greater than 12.  Blood pressure is good at 127/83.  The patient has a history of paroxysmal atrial fibrillation but he has been in sinus rhythm since August.  He denies any tachycardia spells.  He states for breakfast he usually eats Cheerios he has a sandwich for lunch he will occasionally have lasagna which he makes himself with vegetables and will have not grilled chicken.  He does not eat pork or beef.  He does eat some vegetables.  He still occasionally uses sugar sweetened beverages.  Patient denies any neuropathic pains.  He has had diabetes for about 10 years.  His last LDL was greater than 110.  His goal is to have an LDL less than 70.  He has had elevated liver functions in the past this needs to be refollowed.  He has never been screened for hepatitis C.  Patient declines to receive the pneumonia tetanus and flu vaccines.  He is yet to have an eye exam.  Patient also needs a dental exam.  He does remain uninsured and he has not been able to achieve an orange card because he cannot get the information for his taxes to be able to apply for the orange card.  He works part-time at [employer].  He cannot achieve health insurance.  Patient has been on the 70/30 Humulin product twice daily at 20 units. Foot tdap pcv hcv flu   Saw PA McClung in 04/2021: [name] is a 46 y.o. male here today for a follow up visit and to establish care. After overnight hospitalization with Afib 8/6-04/25/2021.   Saw cardiology in f/up 05/05/2021.  No further CP/SOB/palpitations.  He says he feels well overall.  He has been taking 15-18 units novolog twice daily.  Says lantus didn't work for him.  He feels that his blood sugars are fine with running 200-300.  He is happy with 12.8 A1C.  Says he is 100% compliant with insulin   From discharge summary: 1.  Atrial fibrillation: s/p cardioversion; currently in NSR; CHA2Ds2-VASc score 1 - did not initiate anticoagulation during this admission; recommend following up in A.fib clinic   Diabetes mellitus with hyperglycemia: Continue with home Novolog 70/30 15U bid; follow up with PCP for insulin adjustments    2.  Labs / imaging needed at time of follow-up: None   3.  Pending labs/ test needing follow-up: None   1. Atrial fibrillation Patient presented with acute onset palpitations and diaphoresis when going up and down a flight of stairs.  He was noted to be in atrial fibrillation with heart rate in the 220s with EMS for which she was given multiple doses of adenosine and 1 dose of Cardizem.  In the emergency department, patient was also noted to be hypotensive with systolic in 80s to 90s at which point he was cardioverted 3 times.  He has not had any recent acute illnesses, no toxin exposures, pulmonary disease.  Echo without any left atrial dilatation noted.  CHA2DS2-VASc score 1, low risk for stroke, anticoagulation was not initiated during this hospitalization.  He maintained sinus rhythm for the remainder of the hospitalization. Patient is recommended to follow-up in atrial fibrillation clinic.  Referral to the clinic was placed and patient also given contact information for the atrial fibrillation clinic.   2. Diabetes mellitus with hyperglycemia  Patient has 10-year history of poorly controlled diabetes with A1c of 12%.  He reports a strong family history of insulin-dependent diabetes.  Given his young age, no other significant risk factors, and family history,  concern if this is type 1 diabetes.  He was unable to tolerate metformin in the past.  He is currently on NovoLog 70/30 15 units twice daily.  He reports that his CBGs range 150-250.  He was noted to be hyperglycemic >300, pH approximately 7.3 with bicarb of 18.  He did not have any anion gap.  However, did have an elevated beta hydroxybutyrate for which insulin drip was initiated.  Patient's beta hydroxybutyrate corrected and he was transitioned to subcutaneous insulin.  He is discharged on his NovoLog 70/30 15 to 18 units twice daily.  He is recommended to follow-up with his PCP for insulin adjustments.   A/P from cardiology 8/17:   Assessment and Plan:  1. New onset afib with RVR Successful cardioversion and remains in SR today General education re afib and triggers  Pt advised to stop marijuana use    2. CHA2DS2VASc  score of 1 (DM) Pt was not started on anticoagulation for low risk stroke score However, it is [facility] cardioversion protocol that anyone cardioverted,  despite CHA2DS2VASc score, will be placed on anticoagulation for 4 weeks afterward.  I discussed this with pt today but since it has one week since DCCV he defers start of anticoagulation.   1. Type 2 diabetes mellitus with hyperglycemia, with long-term current use of insulin (HCC) Not controlled-check blood sugars bid and record and bring to next visit with [facility].  Increase novolog to 20 bid with meals - Glucose (CBG) - HgB A1c - insulin NPH-regular Human (70-30) 100 UNIT/ML injection; Inject 20 Units into the skin 2 (two) times daily with a meal.  Dispense: 12 mL; Refill: 4 - Insulin Syringe-Needle U-100 (GLOBAL INSULIN SYRINGES) 30G X 1/2" 0.3 ML MISC; 1 each by Does not apply route 2 (two) times daily.  Dispense: 100 each; Refill: 4 - Comprehensive metabolic panel; Future   2. Hyperlipidemia LDL goal <70 - Comprehensive metabolic panel; Future - Lipid panel; Future   3. Hospital discharge follow-up   4. Atrial  fibrillation with RVR (HCC) Stable/followed by cardiology.    Last Afib visit 04/2021 [name] is a 46 y.o. male with a h/o type 1 DM, that developed acute onset of palpitations and was found to be in new onset afib with EMS finding a HR of around 220 bpm. He was given several doses of adenosine as well as 20 mg cardizem IV and still did not break. He was then cardioverted x 3 ultimately resuming SR.    In the afib clinic, he is in a sinus tach at 101 bpm. He states he walked a long distance to the clinic and is anxious re the outcome. He reports no alcohol use, no snoring, no excessive caffeine, but does smoke marijuana occasionally. He denies smoking prior to onset of palpitations. He reports that he feels improved. Echo done showed normal EF.    Today, he  denies symptoms of palpitations, chest pain, shortness of breath, orthopnea, PND, lower extremity edema, dizziness, presyncope, syncope, or neurologic sequela. The patient is tolerating medications without difficulties and is otherwise without complaint today.   Assessment and Plan:  1. New onset afib with RVR Successful cardioversion and remains in SR today General education re afib and triggers  Pt advised to stop marijuana use    2. CHA2DS2VASc  score of 1 (DM) Pt was not started on anticoagulation for low risk stroke score However, it is [facility] cardioversion protocol that anyone cardioverted,  despite CHA2DS2VASc score, will be placed on anticoagulation for 4 weeks afterward.  I discussed this with pt today but since it has one week since DCCV he defers start of anticoagulation.     The pt never saw [facility] No Cards f/u since 04/2021 as Afib clinic released the pt, was in NSR at 04/2021 OV   11/30/21 Colon urine alb Past Medical History:  Diagnosis Date   AKI (acute kidney injury) (HCC) 03/21/2018   Diabetes mellitus    Newly diagnosed, May 2012    Past Surgical History:  Procedure Laterality Date   APPENDECTOMY      TONSILLECTOMY      Family History  Problem Relation Age of Onset   Hypertension Mother    Diabetes Sister        Several brothers and sister with DM   Diabetes Brother     Social History   Socioeconomic History   Marital status: Single    Spouse name: Not on file   Number of children: Not on file   Years of education: Not on file   Highest education level: Not on file  Occupational History   Not on file  Tobacco Use   Smoking status: Former    Types: Cigarettes    Quit date: 10/20/2015    Years since quitting: 6.1   Smokeless tobacco: Former    Quit date: 10/20/2015   Tobacco comments:    Former smoker 05/05/2021  Substance and Sexual Activity   Alcohol use: No    Alcohol/week: 0.0 standard drinks   Drug use: Yes    Types: Marijuana    Comment: every 1-3 days   Sexual activity: Not on file  Other Topics Concern   Not on file  Social History Narrative   Currently lives with his parents in [place]. Works at [employer]. Denies any current alcohol, tobacco or illicit drug use.         Social Determinants of Health   Financial Resource Strain: Not on file  Food Insecurity: Not on file  Transportation Needs: Not on file  Physical Activity: Not on file  Stress: Not on file  Social Connections: Not on file  Intimate Partner Violence: Not on file    ROSk Review of Systems  Constitutional: Negative.   HENT: Negative.    Eyes: Negative.   Respiratory: Negative.    Cardiovascular:  Negative for chest pain, palpitations and leg swelling.  Gastrointestinal: Negative.   Endocrine: Positive for polyphagia. Negative for polydipsia and polyuria.  Genitourinary: Negative.   Musculoskeletal: Negative.   Neurological:  Positive for light-headedness.  Psychiatric/Behavioral:  Positive for dysphoric mood. Negative for self-injury, sleep disturbance and suicidal ideas. The patient is nervous/anxious.    Objective:   Today's Vitals: There were no vitals  taken for this visit.  Physical Exam Vitals reviewed.  Constitutional:      Appearance: Normal appearance. He is well-developed. He is not diaphoretic.  Comments: thin  HENT:     Head: Normocephalic and atraumatic.     Nose: Nose normal. No nasal deformity, septal deviation, mucosal edema or rhinorrhea.     Right Sinus: No maxillary sinus tenderness or frontal sinus tenderness.     Left Sinus: No maxillary sinus tenderness or frontal sinus tenderness.     Mouth/Throat:     Mouth: Mucous membranes are moist.     Pharynx: Oropharynx is clear. No oropharyngeal exudate.     Comments: Poor dentition, periodontal disease and dental caries Eyes:     General: No scleral icterus.    Conjunctiva/sclera: Conjunctivae normal.     Pupils: Pupils are equal, round, and reactive to light.  Neck:     Thyroid: No thyromegaly.     Vascular: No carotid bruit or JVD.     Trachea: Trachea normal. No tracheal tenderness or tracheal deviation.  Cardiovascular:     Rate and Rhythm: Normal rate and regular rhythm.     Chest Wall: PMI is not displaced.     Pulses: Normal pulses. No decreased pulses.     Heart sounds: Normal heart sounds, S1 normal and S2 normal. Heart sounds not distant. No murmur heard. No systolic murmur is present.  No diastolic murmur is present.    No friction rub. No gallop. No S3 or S4 sounds.  Pulmonary:     Effort: Pulmonary effort is normal. No tachypnea, accessory muscle usage or respiratory distress.     Breath sounds: Normal breath sounds. No stridor. No decreased breath sounds, wheezing, rhonchi or rales.  Chest:     Chest wall: No tenderness.  Abdominal:     General: Bowel sounds are normal. There is no distension.     Palpations: Abdomen is soft. Abdomen is not rigid.     Tenderness: There is no abdominal tenderness. There is no guarding or rebound.  Musculoskeletal:        General: Normal range of motion.     Cervical back: Normal range of motion and neck supple.  No edema, erythema or rigidity. No muscular tenderness. Normal range of motion.     Comments: Foot exam normal  Lymphadenopathy:     Head:     Right side of head: No submental or submandibular adenopathy.     Left side of head: No submental or submandibular adenopathy.     Cervical: No cervical adenopathy.  Skin:    General: Skin is warm and dry.     Coloration: Skin is not pale.     Findings: No rash.     Nails: There is no clubbing.  Neurological:     General: No focal deficit present.     Mental Status: He is alert and oriented to person, place, and time. Mental status is at baseline.     Sensory: No sensory deficit.  Psychiatric:        Mood and Affect: Mood normal.        Speech: Speech normal.        Behavior: Behavior normal.        Thought Content: Thought content normal.        Judgment: Judgment normal.   Echo 04/2021 1. Left ventricular ejection fraction, by estimation, is 55 to 60%. The  left ventricle has normal function. The left ventricle has no regional  wall motion abnormalities. There is mild left ventricular hypertrophy.  Indeterminate diastolic filling due to  E-A fusion.   2. Right ventricular systolic function is normal.  The right ventricular  size is normal. Tricuspid regurgitation signal is inadequate for assessing  PA pressure.   3. A small pericardial effusion is present. There is no evidence of  cardiac tamponade.   4. The mitral valve is normal in structure. Trivial mitral valve  regurgitation. No evidence of mitral stenosis.   5. The aortic valve was not well visualized. There is mild calcification  of the aortic valve. Aortic valve regurgitation is not visualized. No  aortic stenosis is present.   6. Aortic dilatation noted. There is mild dilatation of the aortic root,  measuring 39 mm.   7. The inferior vena cava is normal in size with greater than 50%  respiratory variability, suggesting right atrial pressure of 3 mmHg.  Assessment & Plan:    Problem List Items Addressed This Visit   None  Outpatient Encounter Medications as of 11/30/2021  Medication Sig   Insulin Glargine (BASAGLAR KWIKPEN) 100 UNIT/ML Inject 24 Units into the skin daily.   insulin lispro (HUMALOG) 100 UNIT/ML KwikPen Inject 6 Units into the skin 2 (two) times daily before a meal. Before lunch and dinner, hold if glucose is less than 150   Insulin Pen Needle 31G X 5 MM MISC Use to inject insulin 2 (two) times daily.   rosuvastatin (CRESTOR) 20 MG tablet Take 1 tablet (20 mg total) by mouth daily. To lower cholesterol   No facility-administered encounter medications on file as of 11/30/2021.   36 minutes spent reviewing history and physical with the patient documenting note patient education high complex decision making collaborating with clinical pharmacy Follow-up: No follow-ups on file.   Shan Levans, MD

## 2021-11-30 ENCOUNTER — Ambulatory Visit: Payer: Self-pay | Admitting: Critical Care Medicine

## 2021-12-17 ENCOUNTER — Other Ambulatory Visit: Payer: Self-pay

## 2021-12-23 IMAGING — DX DG CHEST 1V PORT
1 series · 1 of 1 positions shown · non-contrast
Comparison: 03/21/2018 and prior studies

CLINICAL DATA: Atrial fibrillation

EXAM:
PORTABLE CHEST 1 VIEW

[chest]
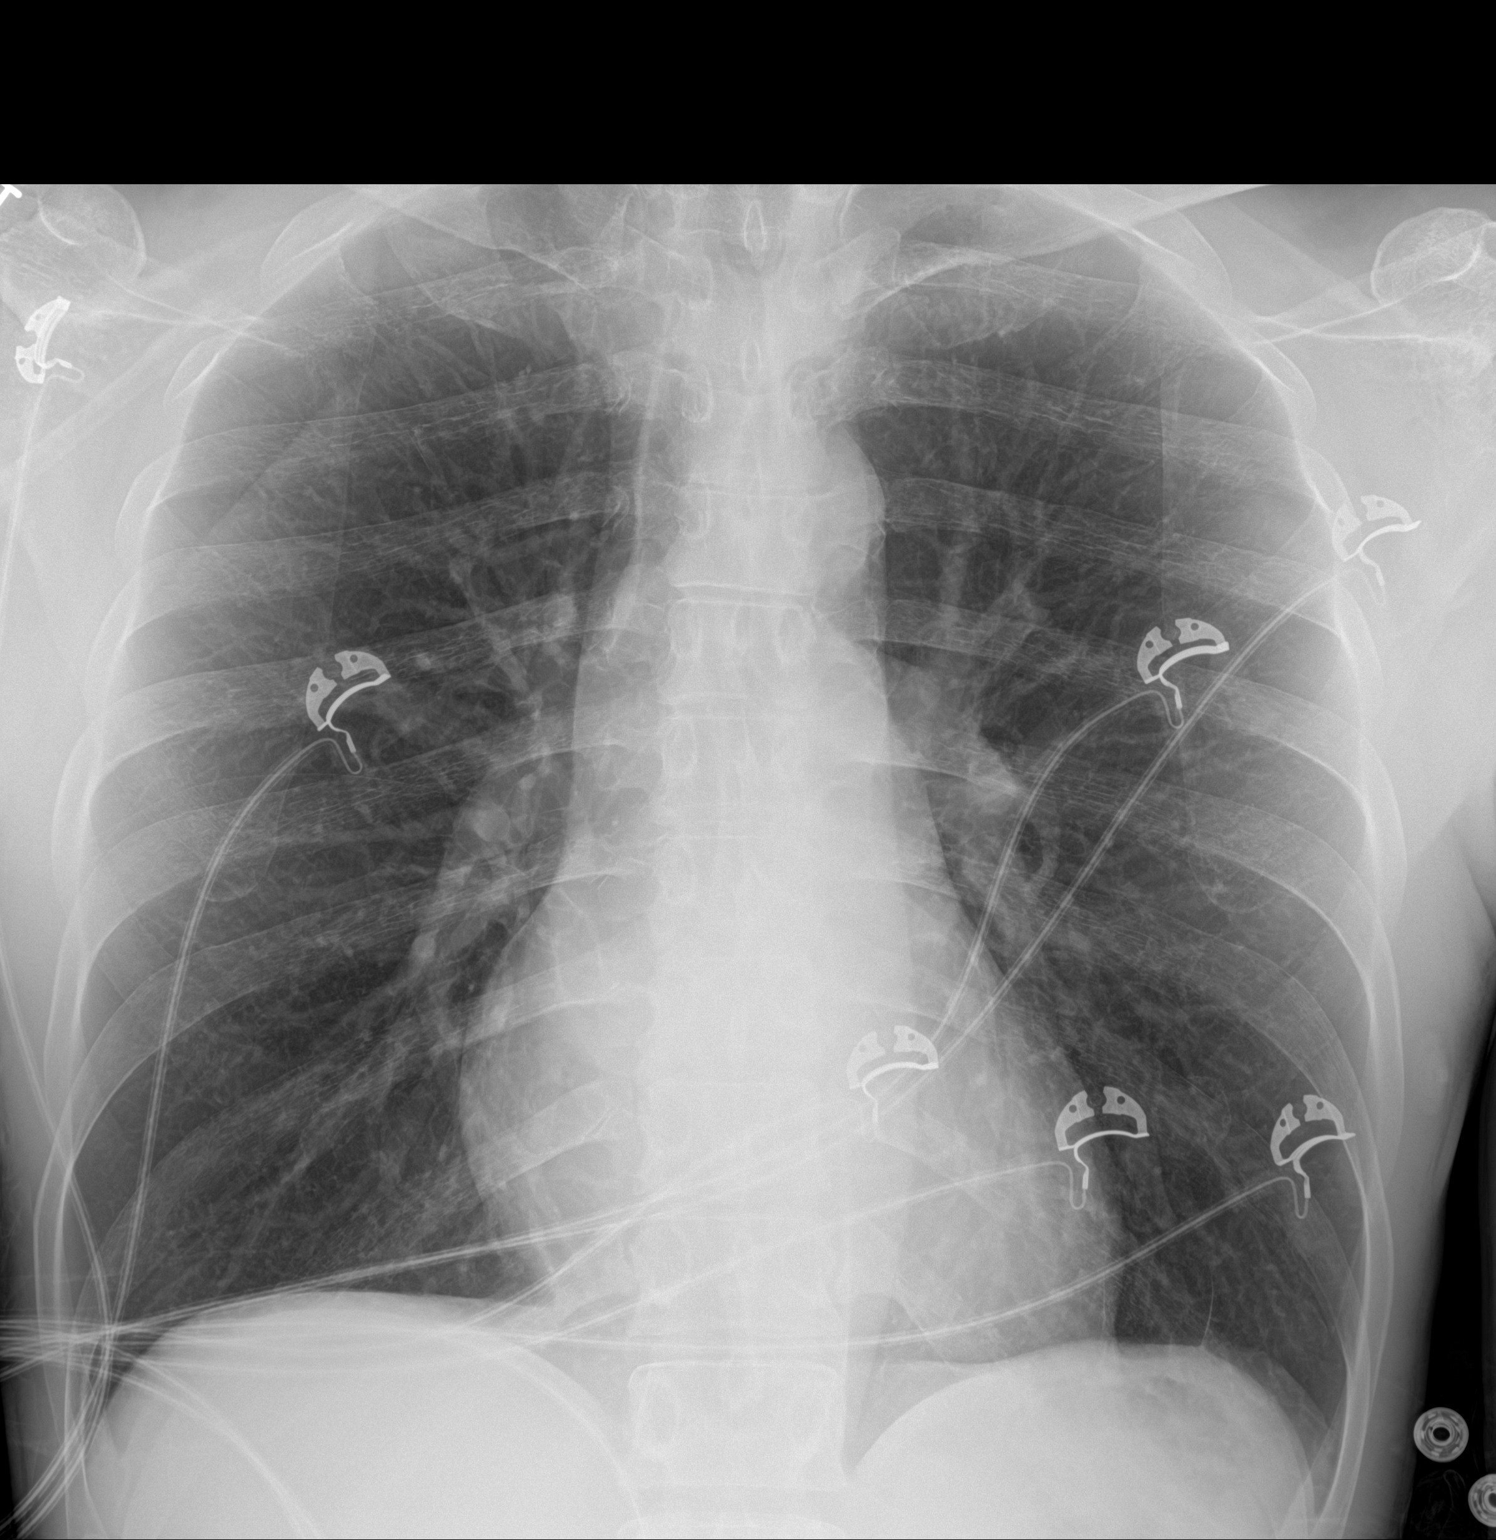

[1 of 1 positions shown; findings below may reference images not displayed]

FINDINGS: The cardiomediastinal silhouette is unremarkable.

There is no evidence of focal airspace disease, pulmonary edema,
suspicious pulmonary nodule/mass, pleural effusion, or pneumothorax.

No acute bony abnormalities are identified.
IMPRESSION: No active disease.

## 2022-02-07 ENCOUNTER — Other Ambulatory Visit: Payer: Self-pay

## 2022-02-07 ENCOUNTER — Other Ambulatory Visit: Payer: Self-pay | Admitting: Critical Care Medicine

## 2022-02-08 ENCOUNTER — Other Ambulatory Visit: Payer: Self-pay

## 2022-02-08 MED ORDER — TRUEPLUS 5-BEVEL PEN NEEDLES 31G X 5 MM MISC
1.0000 | Freq: Two times a day (BID) | 3 refills | Status: DC
  Filled 2022-02-08: qty 100, 33d supply, fill #0
  Filled 2022-03-18: qty 100, 33d supply, fill #1

## 2022-02-08 NOTE — Telephone Encounter (Signed)
Requested Prescriptions  Pending Prescriptions Disp Refills  . Insulin Pen Needle (TRUEPLUS 5-BEVEL PEN NEEDLES) 31G X 5 MM MISC 100 each 3    Sig: Use to inject insulin 2 (two) times daily.     Endocrinology: Diabetes - Testing Supplies Passed - 02/07/2022 10:19 AM      Passed - Valid encounter within last 12 months    Recent Outpatient Visits          3 months ago Type 2 diabetes mellitus with hyperglycemia, with long-term current use of insulin [facility])   [place] [facility] Slater, Jeannett Senior L, RPH-CPP   5 months ago Type 2 diabetes mellitus with hyperglycemia, with long-term current use of insulin [facility])   [place] [facility] [place], Jeannett Senior L, RPH-CPP   5 months ago Type 2 diabetes mellitus with hyperglycemia, with long-term current use of insulin [facility])   [place] [facility] Storm Frisk, MD   9 months ago Type 2 diabetes mellitus with hyperglycemia, with long-term current use of insulin [facility])   [place] [facility] [place], [place], [place]   1 year ago Type 2 diabetes mellitus with hyperglycemia, with long-term current use of insulin [facility])   [facility] Hoy Register, MD      Future Appointments            In 2 months [place] Charlcie Cradle, MD [facility]

## 2022-02-09 ENCOUNTER — Other Ambulatory Visit: Payer: Self-pay

## 2022-03-18 ENCOUNTER — Other Ambulatory Visit: Payer: Self-pay

## 2022-04-11 ENCOUNTER — Other Ambulatory Visit: Payer: Self-pay

## 2022-04-11 NOTE — Progress Notes (Unsigned)
New Patient Office Visit  Subjective:  Patient ID: [name], male    DOB: [birth year]  Age: 46 y.o. MRN: [id number]  CC:  No chief complaint on file.   HPI 07/2021 Former FULP patient here to re est PCP [name] presents for Pcp to re establish. Hx CAF, T2DM, neuropathy d/t T2DM, AKI, smoker, HLD goal LDL < 70  This patient comes in today to reestablish for primary care.  The patient's blood sugars have been running in the 200 range at home and on arrival hemoglobin A1c is greater than 12.  Blood pressure is good at 127/83.  The patient has a history of paroxysmal atrial fibrillation but he has been in sinus rhythm since August.  He denies any tachycardia spells.  He states for breakfast he usually eats Cheerios he has a sandwich for lunch he will occasionally have lasagna which he makes himself with vegetables and will have not grilled chicken.  He does not eat pork or beef.  He does eat some vegetables.  He still occasionally uses sugar sweetened beverages.  Patient denies any neuropathic pains.  He has had diabetes for about 10 years.  His last LDL was greater than 110.  His goal is to have an LDL less than 70.  He has had elevated liver functions in the past this needs to be refollowed.  He has never been screened for hepatitis C.  Patient declines to receive the pneumonia tetanus and flu vaccines.  He is yet to have an eye exam.  Patient also needs a dental exam.  He does remain uninsured and he has not been able to achieve an orange card because he cannot get the information for his taxes to be able to apply for the orange card.  He works part-time at [employer].  He cannot achieve health insurance.  Patient has been on the 70/30 Humulin product twice daily at 20 units. Foot tdap pcv hcv flu   Saw PA McClung in 04/2021: [name] is a 46 y.o. male here today for a follow up visit and to establish care. After overnight hospitalization with Afib 8/6-04/25/2021.  Saw  cardiology in f/up 05/05/2021.  No further CP/SOB/palpitations.  He says he feels well overall.  He has been taking 15-18 units novolog twice daily.  Says lantus didn't work for him.  He feels that his blood sugars are fine with running 200-300.  He is happy with 12.8 A1C.  Says he is 100% compliant with insulin   From discharge summary: 1.  Atrial fibrillation: s/p cardioversion; currently in NSR; CHA2Ds2-VASc score 1 - did not initiate anticoagulation during this admission; recommend following up in A.fib clinic   Diabetes mellitus with hyperglycemia: Continue with home Novolog 70/30 15U bid; follow up with PCP for insulin adjustments    2.  Labs / imaging needed at time of follow-up: None   3.  Pending labs/ test needing follow-up: None   1. Atrial fibrillation Patient presented with acute onset palpitations and diaphoresis when going up and down a flight of stairs.  He was noted to be in atrial fibrillation with heart rate in the 220s with EMS for which she was given multiple doses of adenosine and 1 dose of Cardizem.  In the emergency department, patient was also noted to be hypotensive with systolic in 80s to 90s at which point he was cardioverted 3 times.  He has not had any recent acute illnesses, no toxin exposures, pulmonary disease.  Echo without any left atrial dilatation noted.  CHA2DS2-VASc score 1, low risk for stroke, anticoagulation was not initiated during this hospitalization.  He maintained sinus rhythm for the remainder of the hospitalization. Patient is recommended to follow-up in atrial fibrillation clinic.  Referral to the clinic was placed and patient also given contact information for the atrial fibrillation clinic.   2. Diabetes mellitus with hyperglycemia  Patient has 10-year history of poorly controlled diabetes with A1c of 12%.  He reports a strong family history of insulin-dependent diabetes.  Given his young age, no other significant risk factors, and family history,  concern if this is type 1 diabetes.  He was unable to tolerate metformin in the past.  He is currently on NovoLog 70/30 15 units twice daily.  He reports that his CBGs range 150-250.  He was noted to be hyperglycemic >300, pH approximately 7.3 with bicarb of 18.  He did not have any anion gap.  However, did have an elevated beta hydroxybutyrate for which insulin drip was initiated.  Patient's beta hydroxybutyrate corrected and he was transitioned to subcutaneous insulin.  He is discharged on his NovoLog 70/30 15 to 18 units twice daily.  He is recommended to follow-up with his PCP for insulin adjustments.   A/P from cardiology 8/17:   Assessment and Plan:  1. New onset afib with RVR Successful cardioversion and remains in SR today General education re afib and triggers  Pt advised to stop marijuana use    2. CHA2DS2VASc  score of 1 (DM) Pt was not started on anticoagulation for low risk stroke score However, [facility] that anyone cardioverted,  despite CHA2DS2VASc score, will be placed on anticoagulation for 4 weeks afterward.  I discussed this with pt today but since it has one week since DCCV he defers start of anticoagulation.   1. Type 2 diabetes mellitus with hyperglycemia, with long-term current use of insulin (HCC) Not controlled-check blood sugars bid and record and [facility]  Increase novolog to 20 bid with meals - Glucose (CBG) - HgB A1c - insulin NPH-regular Human (70-30) 100 UNIT/ML injection; Inject 20 Units into the skin 2 (two) times daily with a meal.  Dispense: 12 mL; Refill: 4 - Insulin Syringe-Needle U-100 (GLOBAL INSULIN SYRINGES) 30G X 1/2" 0.3 ML MISC; 1 each by Does not apply route 2 (two) times daily.  Dispense: 100 each; Refill: 4 - Comprehensive metabolic panel; Future   2. Hyperlipidemia LDL goal <70 - Comprehensive metabolic panel; Future - Lipid panel; Future   3. Hospital discharge follow-up   4. Atrial  fibrillation with RVR (HCC) Stable/followed by cardiology.    Last Afib visit 04/2021 [name] is a 46 y.o. male with a h/o type 1 DM, that developed acute onset of palpitations and was found to be in new onset afib with EMS finding a HR of around 220 bpm. He was given several doses of adenosine as well as 20 mg cardizem IV and still did not break. He was then cardioverted x 3 ultimately resuming SR.    In the afib clinic, he is in a sinus tach at 101 bpm. He states he walked a long distance to the clinic and is anxious re the outcome. He reports no alcohol use, no snoring, no excessive caffeine, but does smoke marijuana occasionally. He denies smoking prior to onset of palpitations. He reports that he feels improved. Echo done showed normal EF.    Today, he  denies symptoms of palpitations, chest pain, shortness of breath, orthopnea, PND, lower extremity edema, dizziness, presyncope, syncope, or neurologic sequela. The patient is tolerating medications without difficulties and is otherwise without complaint today.   Assessment and Plan:  1. New onset afib with RVR Successful cardioversion and remains in SR today General education re afib and triggers  Pt advised to stop marijuana use    2. CHA2DS2VASc  score of 1 (DM) Pt was not started on anticoagulation for low risk stroke score However, it is [facility] cardioversion protocol that anyone cardioverted,  despite CHA2DS2VASc score, will be placed on anticoagulation for 4 weeks afterward.  I discussed this with pt today but since it has one week since DCCV he defers start of anticoagulation.     The pt never saw [facility]. 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)    Encouraged to apply and obtain the orange card so can send him for dental care      Relevant Medications   rosuvastatin (CRESTOR) 20 MG tablet   insulin  lispro (HUMALOG) 100 UNIT/ML KwikPen   Insulin Glargine (BASAGLAR KWIKPEN) 100 UNIT/ML   Dental caries    Several of the molars are worn down with significant cavity formation as per periodontal disease assessment        Endocrine   Diabetic neuropathy (HCC)    Currently stable at this time      Relevant Medications   rosuvastatin (CRESTOR) 20 MG tablet   insulin lispro (HUMALOG) 100 UNIT/ML KwikPen   Insulin Glargine (BASAGLAR KWIKPEN) 100 UNIT/ML   Type 2 diabetes mellitus with hyperglycemia, with long-term current use of insulin (HCC) - Primary    A1c greater than 12 at this visit I conferred with clinical pharmacy plan is for him to see clinical pharmacy again in 3 weeks and will discontinue Humulin and begin insulin glargine 30 units at bedtime and insulin lispro 6 units before lunch and dinner but holding if blood sugar less than 150  Patient instructed as to proper diet  Patient needs to get the orange card so he can send in for dental care  Patient given I resources to obtain an eye exam  Patient declined to receive the flu vaccine or pneumonia vaccine or tetanus vaccine      Relevant Medications   rosuvastatin (CRESTOR) 20 MG tablet   insulin lispro (HUMALOG) 100 UNIT/ML KwikPen   Insulin Glargine (BASAGLAR KWIKPEN) 100 UNIT/ML   Other Relevant Orders   POCT glucose (manual entry) (Completed)   POCT glycosylated hemoglobin (Hb A1C) (Completed)   Comprehensive metabolic panel     Genitourinary   RESOLVED: AKI (acute kidney injury) (HCC)    History of acute kidney injury has resolved        Other   History of smoking    Not currently smoking advised to avoid      Hyperlipidemia LDL goal <70    Resume Crestor at 20 mg daily we will recheck LDL at a later date      Relevant Medications   rosuvastatin (CRESTOR) 20 MG tablet   History of atrial fibrillation    Currently in sinus rhythm not on any maintenance medications will observe for now      Other  Visit Diagnoses     Need for hepatitis C screening test  Relevant Orders   HCV Ab w Reflex to Quant PCR   10/2021 pharm D CPP   Patient has a history of non-compliance. He could not tolerate metformin in the past. I have seen him before and he was reluctant to increase his insulin dose secondary to relative hypoglycemia.    Family/Social History:  - FHx: DM (3 siblings) - Tobacco: 1 cigarette q2weeks - Alcohol: denies   Insurance coverage/medication affordability:  - Self-pay   Patient reports adherence with medications.  Current diabetes medications include: Basaglar 24 units daily (taking 20-25 units daily), Humalog 6 units BID before meals (hold dose if BG <150 before meal)   Patient reports two hypoglycemic events with readings 69 and 72. Treated appropriately with orange juice and rechecked BG which came up.    Patient reported dietary habits:  - Occasionally drinks sweet tea, orange juice - Reports eating more vegetables, trying to avoid eating out as much   Patient-reported exercise habits:  - Mostly active at work  - No formal exercise outside of work    Patient denies polyuria.  Patient denies neuropathy. Patient denies visual changes.   O:  Home glucose levels: 130s-170s. Occasional readings over >200. Denies any readings >300.    Recent Labs       Lab Results  Component Value Date    HGBA1C 12.7 (A) 08/17/2021      There were no vitals filed for this visit.   Lipid Panel  Labs (Brief)          Component Value Date/Time    CHOL 183 05/14/2021 1357    TRIG 106 05/14/2021 1357    HDL 54 05/14/2021 1357    CHOLHDL 3.4 05/14/2021 1357    CHOLHDL 4.0 04/09/2014 1106    VLDL 18 04/09/2014 1106    LDLCALC 110 (H) 05/14/2021 1357      Clinical ASCVD: No    A/P: Diabetes longstanding currently uncontrolled. Patient is able to verbalize appropriate hypoglycemia management plan. Patient is adherent with medication. Per patient report of BG readings,  current regimen appears to be controlling his BG well. Due for A1c at the end of this month. Needs to be seen by PCP after missed appointment.  -Continue current insulin regimen  -Extensively discussed pathophysiology of DM, recommended lifestyle interventions, dietary effects on glycemic control -Counseled on s/sx of and management of hypoglycemia -Next A1C anticipated at next visit with PCP.    Written patient instructions provided. Total time in face to face counseling 15 minutes. Follow up PCP visit next available.    Pervis Hocking, PharmD   Past Medical History:  Diagnosis Date   AKI (acute kidney injury) (HCC) 03/21/2018   Diabetes mellitus    Newly diagnosed, May 2012    Past Surgical History:  Procedure Laterality Date   APPENDECTOMY     TONSILLECTOMY      Family History  Problem Relation Age of Onset   Hypertension Mother    Diabetes Sister        Several brothers and sister with DM   Diabetes Brother     Social History   Socioeconomic History   Marital status: Single    Spouse name: Not on file   Number of children: Not on file   Years of education: Not on file   Highest education level: Not on file  Occupational History   Not on file  Tobacco Use   Smoking status: Former    Types: Cigarettes    Quit date:  10/20/2015    Years since quitting: 6.4   Smokeless tobacco: Former    Quit date: 10/20/2015   Tobacco comments:    Former smoker 05/05/2021  Substance and Sexual Activity   Alcohol use: No    Alcohol/week: 0.0 standard drinks of alcohol   Drug use: Yes    Types: Marijuana    Comment: every 1-3 days   Sexual activity: Not on file  Other Topics Concern   Not on file  Social History Narrative   Currently lives with his parents in [place]. Works at [employer]. Denies any current alcohol, tobacco or illicit drug use.         Social Determinants of Health   Financial Resource Strain: Not on file  Food Insecurity: Not on file  Transportation  Needs: Not on file  Physical Activity: Not on file  Stress: Not on file  Social Connections: Not on file  Intimate Partner Violence: Not on file    ROSk Review of Systems  Constitutional: Negative.   HENT: Negative.    Eyes: Negative.   Respiratory: Negative.    Cardiovascular:  Negative for chest pain, palpitations and leg swelling.  Gastrointestinal: Negative.   Endocrine: Positive for polyphagia. Negative for polydipsia and polyuria.  Genitourinary: Negative.   Musculoskeletal: Negative.   Neurological:  Positive for light-headedness.  Psychiatric/Behavioral:  Positive for dysphoric mood. Negative for self-injury, sleep disturbance and suicidal ideas. The patient is nervous/anxious.     Objective:   Today's Vitals: There were no vitals taken for this visit.  Physical Exam Vitals reviewed.  Constitutional:      Appearance: Normal appearance. He is well-developed. He is not diaphoretic.     Comments: thin  HENT:     Head: Normocephalic and atraumatic.     Nose: Nose normal. No nasal deformity, septal deviation, mucosal edema or rhinorrhea.     Right Sinus: No maxillary sinus tenderness or frontal sinus tenderness.     Left Sinus: No maxillary sinus tenderness or frontal sinus tenderness.     Mouth/Throat:     Mouth: Mucous membranes are moist.     Pharynx: Oropharynx is clear. No oropharyngeal exudate.     Comments: Poor dentition, periodontal disease and dental caries Eyes:     General: No scleral icterus.    Conjunctiva/sclera: Conjunctivae normal.     Pupils: Pupils are equal, round, and reactive to light.  Neck:     Thyroid: No thyromegaly.     Vascular: No carotid bruit or JVD.     Trachea: Trachea normal. No tracheal tenderness or tracheal deviation.  Cardiovascular:     Rate and Rhythm: Normal rate and regular rhythm.     Chest Wall: PMI is not displaced.     Pulses: Normal pulses. No decreased pulses.     Heart sounds: Normal heart sounds, S1 normal and S2  normal. Heart sounds not distant. No murmur heard.    No systolic murmur is present.     No diastolic murmur is present.     No friction rub. No gallop. No S3 or S4 sounds.  Pulmonary:     Effort: Pulmonary effort is normal. No tachypnea, accessory muscle usage or respiratory distress.     Breath sounds: Normal breath sounds. No stridor. No decreased breath sounds, wheezing, rhonchi or rales.  Chest:     Chest wall: No tenderness.  Abdominal:     General: Bowel sounds are normal. There is no distension.     Palpations: Abdomen is  soft. Abdomen is not rigid.     Tenderness: There is no abdominal tenderness. There is no guarding or rebound.  Musculoskeletal:        General: Normal range of motion.     Cervical back: Normal range of motion and neck supple. No edema, erythema or rigidity. No muscular tenderness. Normal range of motion.     Comments: Foot exam normal  Lymphadenopathy:     Head:     Right side of head: No submental or submandibular adenopathy.     Left side of head: No submental or submandibular adenopathy.     Cervical: No cervical adenopathy.  Skin:    General: Skin is warm and dry.     Coloration: Skin is not pale.     Findings: No rash.     Nails: There is no clubbing.  Neurological:     General: No focal deficit present.     Mental Status: He is alert and oriented to person, place, and time. Mental status is at baseline.     Sensory: No sensory deficit.  Psychiatric:        Mood and Affect: Mood normal.        Speech: Speech normal.        Behavior: Behavior normal.        Thought Content: Thought content normal.        Judgment: Judgment normal.    Echo 04/2021 1. Left ventricular ejection fraction, by estimation, is 55 to 60%. The  left ventricle has normal function. The left ventricle has no regional  wall motion abnormalities. There is mild left ventricular hypertrophy.  Indeterminate diastolic filling due to  E-A fusion.   2. Right ventricular systolic  function is normal. The right ventricular  size is normal. Tricuspid regurgitation signal is inadequate for assessing  PA pressure.   3. A small pericardial effusion is present. There is no evidence of  cardiac tamponade.   4. The mitral valve is normal in structure. Trivial mitral valve  regurgitation. No evidence of mitral stenosis.   5. The aortic valve was not well visualized. There is mild calcification  of the aortic valve. Aortic valve regurgitation is not visualized. No  aortic stenosis is present.   6. Aortic dilatation noted. There is mild dilatation of the aortic root,  measuring 39 mm.   7. The inferior vena cava is normal in size with greater than 50%  respiratory variability, suggesting right atrial pressure of 3 mmHg.  Assessment & Plan:   Problem List Items Addressed This Visit   None  Outpatient Encounter Medications as of 04/12/2022  Medication Sig   Insulin Glargine (BASAGLAR KWIKPEN) 100 UNIT/ML Inject 24 Units into the skin daily.   insulin lispro (HUMALOG) 100 UNIT/ML KwikPen Inject 6 Units into the skin 2 (two) times daily before a meal. Before lunch and dinner, hold if glucose is less than 150   Insulin Pen Needle (TRUEPLUS 5-BEVEL PEN NEEDLES) 31G X 5 MM MISC Use to inject insulin 2 (two) times daily.   rosuvastatin (CRESTOR) 20 MG tablet Take 1 tablet (20 mg total) by mouth daily. To lower cholesterol   No facility-administered encounter medications on file as of 04/12/2022.   36 minutes spent reviewing history and physical with the patient documenting note patient education high complex decision making collaborating with clinical pharmacy Follow-up: No follow-ups on file.   Shan Levans, MD

## 2022-04-12 ENCOUNTER — Ambulatory Visit: Payer: Self-pay | Attending: Critical Care Medicine | Admitting: Critical Care Medicine

## 2022-04-12 ENCOUNTER — Encounter: Payer: Self-pay | Admitting: Critical Care Medicine

## 2022-04-12 ENCOUNTER — Other Ambulatory Visit: Payer: Self-pay

## 2022-04-12 VITALS — BP 123/83 | HR 100 | Ht 72.0 in | Wt 132.0 lb

## 2022-04-12 DIAGNOSIS — E11630 Type 2 diabetes mellitus with periodontal disease: Secondary | ICD-10-CM

## 2022-04-12 DIAGNOSIS — E785 Hyperlipidemia, unspecified: Secondary | ICD-10-CM

## 2022-04-12 DIAGNOSIS — Z1211 Encounter for screening for malignant neoplasm of colon: Secondary | ICD-10-CM

## 2022-04-12 DIAGNOSIS — I4891 Unspecified atrial fibrillation: Secondary | ICD-10-CM | POA: Insufficient documentation

## 2022-04-12 DIAGNOSIS — Z794 Long term (current) use of insulin: Secondary | ICD-10-CM

## 2022-04-12 DIAGNOSIS — E1165 Type 2 diabetes mellitus with hyperglycemia: Secondary | ICD-10-CM

## 2022-04-12 LAB — POCT GLYCOSYLATED HEMOGLOBIN (HGB A1C): HbA1c, POC (controlled diabetic range): 10.6 % — AB (ref 0.0–7.0)

## 2022-04-12 LAB — GLUCOSE, POCT (MANUAL RESULT ENTRY): POC Glucose: 101 mg/dl — AB (ref 70–99)

## 2022-04-12 MED ORDER — ROSUVASTATIN CALCIUM 20 MG PO TABS
20.0000 mg | ORAL_TABLET | Freq: Every day | ORAL | 4 refills | Status: DC
  Filled 2022-04-12: qty 60, 60d supply, fill #0

## 2022-04-12 MED ORDER — TRUEPLUS 5-BEVEL PEN NEEDLES 31G X 5 MM MISC
1.0000 | Freq: Two times a day (BID) | 3 refills | Status: DC
  Filled 2022-04-12 – 2022-06-07 (×2): qty 100, 33d supply, fill #0
  Filled 2022-06-30: qty 100, 50d supply, fill #1
  Filled 2022-08-26: qty 100, 50d supply, fill #2
  Filled 2022-11-04: qty 60, 30d supply, fill #3
  Filled 2022-11-04 – 2023-01-18 (×2): qty 100, 30d supply, fill #3

## 2022-04-12 MED ORDER — INSULIN LISPRO (1 UNIT DIAL) 100 UNIT/ML (KWIKPEN)
6.0000 [IU] | PEN_INJECTOR | Freq: Two times a day (BID) | SUBCUTANEOUS | 4 refills | Status: DC
  Filled 2022-05-05: qty 3, 25d supply, fill #0
  Filled 2022-06-30: qty 3, 25d supply, fill #1
  Filled 2022-08-26: qty 3, 25d supply, fill #2
  Filled 2022-10-21: qty 3, 25d supply, fill #3
  Filled 2022-12-07 (×2): qty 3, 25d supply, fill #4

## 2022-04-12 MED ORDER — BASAGLAR KWIKPEN 100 UNIT/ML ~~LOC~~ SOPN
24.0000 [IU] | PEN_INJECTOR | Freq: Every day | SUBCUTANEOUS | 4 refills | Status: DC
  Filled 2022-04-12: qty 12, 50d supply, fill #0
  Filled 2022-05-05: qty 9, 37d supply, fill #0
  Filled 2022-06-30: qty 9, 37d supply, fill #1
  Filled 2022-08-26: qty 9, 37d supply, fill #2
  Filled 2022-10-21: qty 9, 37d supply, fill #3
  Filled 2022-12-07: qty 6, 25d supply, fill #4
  Filled 2023-01-18 (×4): qty 6, 25d supply, fill #5

## 2022-04-12 NOTE — Assessment & Plan Note (Addendum)
Today's Hemoglobin A1C = 10.6% (previous 12.7%) Refills for humalog sent Urine microalbumin due today  Recommend dental and opthalmology evaluation, will plan to do so if patient is able to obtain orange card insurance

## 2022-04-12 NOTE — Assessment & Plan Note (Signed)
Maintains normal sinus rhythm without medications  No recent palpitations, chest pain, or shortness of breath. Will continue to observe

## 2022-04-12 NOTE — Patient Instructions (Signed)
Colon cancer screening will be done with a fecal occult kit this will be issued today  You will give [name] a urine sample so we can check for protein in the urine with your diabetes  No change in medications refill sent to our pharmacy  We went over with you the lifestyle medicine handout please focus on some of the food choices that are in the handout  Return to see Dr. Joya Gaskins 2 months  Work on your orange card application

## 2022-04-12 NOTE — Assessment & Plan Note (Signed)
Dental evaluation recommended We will reassess once patient has obtained orange card insurance

## 2022-04-12 NOTE — Assessment & Plan Note (Signed)
Continue daily Crestor.  

## 2022-04-12 NOTE — Progress Notes (Unsigned)
Established Patient Office Visit  Subjective   Patient ID: [name], male    DOB: 10/14/1975  Age: 46 y.o. MRN: [id number]  Chief Complaint  Patient presents with   Diabetes    [name] is a 46 year old male who presents today to follow up on type 2 diabetes. He has history of paroxysmal a-fib, hyperlipidemia and tobacco use. He was previously seen in November 2022.    He is currently using Basaglar 20-25 units daily and 6 units of humalog twice a day (only if sugars are above 150). He has been compliant with his medications and takes his blood sugars at home. Reports they range from 100-150. He saw [name] in 10/2021. His most recent A1C=12.7 (08/17/21). Today's A1C= 10.6. He has been actively trying to live a healthier lifestyle and cooks at home much more now.   At our previous visit, he was in the process of obtaining an orange card. Unfortunately he has yet to do so. He was recommended dental and opthalmology evaluations but was unable to schedule due to financials. He is interested in applying for the orange card today.   He is no longer smoking cigarettes. Denies alcohol use.  At the previous visit, TDAP, pneumonia and flu vaccine were offered, he declined. Declines them again today as well.         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) 08/17/2021   Dental caries 08/17/2021   History of atrial fibrillation 04/24/2021   Diabetic neuropathy (HCC) 08/27/2020   Type 2 diabetes mellitus with hyperglycemia, with long-term current use of insulin (HCC) 08/27/2020   Hyperlipidemia LDL goal <70 08/27/2020   History of smoking 04/08/2014   Past Medical History:  Diagnosis Date   AKI (acute kidney injury) (HCC) 03/21/2018   Diabetes mellitus    Newly diagnosed, May 2012   Past Surgical History:  Procedure Laterality Date   APPENDECTOMY     TONSILLECTOMY      Social History   Tobacco Use   Smoking status: Former    Types: Cigarettes    Quit date: 10/20/2015    Years since quitting: 6.4   Smokeless tobacco: Former    Quit date: 10/20/2015   Tobacco comments:    Former smoker 05/05/2021  Substance Use Topics   Alcohol use: No    Alcohol/week: 0.0 standard drinks of alcohol   Drug use: Yes    Types: Marijuana    Comment: every 1-3 days   Family History  Problem Relation Age of Onset   Hypertension Mother    Diabetes Sister        Several brothers and sister with DM   Diabetes Brother    No Known Allergies    Review of Systems  Constitutional: Negative.   HENT: Negative.    Eyes: Negative.   Respiratory: Negative.    Cardiovascular: Negative.   Gastrointestinal: Negative.   Genitourinary: Negative.   Musculoskeletal: Negative.   Skin: Negative.   Neurological: Negative.   Endo/Heme/Allergies: Negative.   Psychiatric/Behavioral: Negative.        Objective:     BP 123/83   Pulse 100   Ht 6' (1.829 m)   Wt 59.9 kg   SpO2 100%   BMI 17.90 kg/m     Physical Exam Constitutional:      Appearance: Normal appearance.  HENT:  Right Ear: Tympanic membrane, ear canal and external ear normal.     Left Ear: Tympanic membrane, ear canal and external ear normal.     Mouth/Throat:     Mouth: Mucous membranes are moist.     Pharynx: Oropharynx is clear.  Eyes:     Conjunctiva/sclera: Conjunctivae normal.  Cardiovascular:     Rate and Rhythm: Normal rate and regular rhythm.     Pulses: Normal pulses.     Heart sounds: Normal heart sounds.  Pulmonary:     Effort: Pulmonary effort is normal.     Breath sounds: Normal breath sounds.  Abdominal:     General: Bowel sounds are normal.     Palpations: Abdomen is soft.  Skin:    General: Skin is warm.  Neurological:     Mental Status: He is alert and oriented to person, place, and time. Mental status is at baseline.  Psychiatric:        Mood and Affect: Mood  normal.        Behavior: Behavior normal.      Results for orders placed or performed in visit on 04/12/22  POCT glucose (manual entry)  Result Value Ref Range   POC Glucose 101 (A) 70 - 99 mg/dl  POCT glycosylated hemoglobin (Hb A1C)  Result Value Ref Range   Hemoglobin A1C     HbA1c POC (<> result, manual entry)     HbA1c, POC (prediabetic range)     HbA1c, POC (controlled diabetic range) 10.6 (A) 0.0 - 7.0 %       The 10-year ASCVD risk score (Arnett DK, et al., 2019) is: 6.9%    Assessment & Plan:   Problem List Items Addressed This Visit       Cardiovascular and Mediastinum   Atrial fibrillation with RVR (HCC)    Maintains normal sinus rhythm without medications  No recent palpitations, chest pain, or shortness of breath. 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)    Dental evaluation recommended We will reassess once patient has obtained orange card insurance       Relevant Medications   Insulin Glargine (BASAGLAR KWIKPEN) 100 UNIT/ML   insulin lispro (HUMALOG) 100 UNIT/ML KwikPen   rosuvastatin (CRESTOR) 20 MG tablet     Endocrine   Type 2 diabetes mellitus with hyperglycemia, with long-term current use of insulin (HCC) - Primary    Today's Hemoglobin A1C = 10.6% (previous 12.7%) Refills for humalog sent Urine microalbumin due today  Recommend dental and opthalmology evaluation, will plan to do so if patient is able to obtain orange card insurance       Relevant Medications   Insulin Glargine (BASAGLAR KWIKPEN) 100 UNIT/ML   insulin lispro (HUMALOG) 100 UNIT/ML KwikPen   rosuvastatin (CRESTOR) 20 MG tablet   Other Relevant Orders   POCT glucose (manual entry) (Completed)   POCT glycosylated hemoglobin (Hb A1C) (Completed)   Microalbumin / creatinine urine ratio     Other   Hyperlipidemia LDL goal <70    Continue daily  Crestor       Relevant Medications   rosuvastatin (CRESTOR) 20 MG tablet   Other Visit Diagnoses     Colon cancer screening       Relevant Orders   Fecal occult blood, imunochemical       Return in about 2  months (around 06/13/2022).    Roxana Hires, Student-PA

## 2022-04-15 ENCOUNTER — Other Ambulatory Visit: Payer: Self-pay

## 2022-05-05 ENCOUNTER — Other Ambulatory Visit: Payer: Self-pay

## 2022-06-07 ENCOUNTER — Other Ambulatory Visit: Payer: Self-pay

## 2022-06-30 ENCOUNTER — Other Ambulatory Visit: Payer: Self-pay

## 2022-08-24 ENCOUNTER — Ambulatory Visit: Payer: Self-pay | Admitting: Critical Care Medicine

## 2022-08-24 NOTE — Progress Notes (Deleted)
Established Patient Office Visit  Subjective   Patient ID: [name], male    DOB: April 23, 1976  Age: 46 y.o. MRN: [id number]  No chief complaint on file.   Mr Vasek is a 45 year old male who presents today to follow up on type 2 diabetes. He has history of paroxysmal a-fib, hyperlipidemia and tobacco use. He was previously seen in November 2022.    He is currently using Basaglar 20-25 units daily and 6 units of humalog twice a day (only if sugars are above 150). He has been compliant with his medications and takes his blood sugars at home. Reports they range from 100-150. He saw [name] in 10/2021. His most recent A1C=12.7 (08/17/21). Today's A1C= 10.6. He has been actively trying to live a healthier lifestyle and cooks at home much more now.   At our previous visit, he was in the process of obtaining an orange card. Unfortunately he has yet to do so. He was recommended dental and opthalmology evaluations but was unable to schedule due to financials. He is interested in applying for the orange card today.   He is no longer smoking cigarettes. Denies alcohol use.  At the previous visit, TDAP, pneumonia and flu vaccine were offered, he declined. Declines them again today as well.   12/6 Atrial fibrillation with RVR (HCC)  Maintains normal sinus rhythm without medications  No recent palpitations, chest pain, or shortness of breath. 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)  Dental evaluation recommended We will reassess once patient has obtained orange card insurance    Relevant Medications Insulin Glargine (BASAGLAR KWIKPEN) 100 UNIT/ML insulin lispro (HUMALOG) 100 UNIT/ML KwikPen rosuvastatin (CRESTOR) 20 MG tablet Endocrine Type 2 diabetes mellitus with hyperglycemia, with long-term current use of insulin (HCC) - Primary  Today's Hemoglobin A1C = 10.6% (previous  12.7%) Refills for humalog sent Urine microalbumin due today  Recommend dental and opthalmology evaluation, will plan to do so if patient is able to obtain orange card insurance    Relevant Medications Insulin Glargine (BASAGLAR KWIKPEN) 100 UNIT/ML insulin lispro (HUMALOG) 100 UNIT/ML KwikPen rosuvastatin (CRESTOR) 20 MG tablet Other Relevant Orders POCT glucose (manual entry) (Completed) POCT glycosylated hemoglobin (Hb A1C) (Completed) Microalbumin / creatinine urine ratio Other Hyperlipidemia LDL goal <70  Continue daily Crestor    Relevant Medications rosuvastatin (CRESTOR) 20 MG tablet        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) 08/17/2021   Dental caries 08/17/2021   History of atrial fibrillation 04/24/2021   Diabetic neuropathy (HCC) 08/27/2020   Type 2 diabetes mellitus with hyperglycemia, with long-term current use of insulin (HCC) 08/27/2020   Hyperlipidemia LDL goal <70 08/27/2020   History of smoking 04/08/2014   Past Medical History:  Diagnosis Date   AKI (acute kidney injury) (HCC) 03/21/2018   Diabetes mellitus    Newly diagnosed, May 2012   Past Surgical History:  Procedure Laterality Date   APPENDECTOMY     TONSILLECTOMY     Social History   Tobacco Use   Smoking status: Former    Types: Cigarettes    Quit date: 10/20/2015    Years since quitting: 6.8   Smokeless tobacco: Former    Quit date: 10/20/2015   Tobacco comments:    Former smoker  05/05/2021  Substance Use Topics   Alcohol use: No    Alcohol/week: 0.0 standard drinks of alcohol   Drug use: Yes    Types: Marijuana    Comment: every 1-3 days   Family History  Problem Relation Age of Onset   Hypertension Mother    Diabetes Sister        Several brothers and sister with DM   Diabetes Brother    No Known Allergies    Review of Systems  Constitutional:  Negative.   HENT: Negative.    Eyes: Negative.   Respiratory: Negative.    Cardiovascular: Negative.   Gastrointestinal: Negative.   Genitourinary: Negative.   Musculoskeletal: Negative.   Skin: Negative.   Neurological: Negative.   Endo/Heme/Allergies: Negative.   Psychiatric/Behavioral: Negative.        Objective:     There were no vitals taken for this visit.    Physical Exam Constitutional:      Appearance: Normal appearance.  HENT:     Right Ear: Tympanic membrane, ear canal and external ear normal.     Left Ear: Tympanic membrane, ear canal and external ear normal.     Mouth/Throat:     Mouth: Mucous membranes are moist.     Pharynx: Oropharynx is clear.  Eyes:     Conjunctiva/sclera: Conjunctivae normal.  Cardiovascular:     Rate and Rhythm: Normal rate and regular rhythm.     Pulses: Normal pulses.     Heart sounds: Normal heart sounds.  Pulmonary:     Effort: Pulmonary effort is normal.     Breath sounds: Normal breath sounds.  Abdominal:     General: Bowel sounds are normal.     Palpations: Abdomen is soft.  Skin:    General: Skin is warm.  Neurological:     Mental Status: He is alert and oriented to person, place, and time. Mental status is at baseline.  Psychiatric:        Mood and Affect: Mood normal.        Behavior: Behavior normal.      No results found for any visits on 08/24/22.      The 10-year ASCVD risk score (Arnett DK, et al., 2019) is: 6.9%    Assessment & Plan:   Problem List Items Addressed This Visit   None  No follow-ups on file.    Shan Levans, MD

## 2022-08-26 ENCOUNTER — Other Ambulatory Visit (HOSPITAL_COMMUNITY): Payer: Self-pay

## 2022-08-26 ENCOUNTER — Other Ambulatory Visit: Payer: Self-pay

## 2022-09-06 ENCOUNTER — Other Ambulatory Visit: Payer: Self-pay

## 2022-10-21 ENCOUNTER — Other Ambulatory Visit: Payer: Self-pay

## 2022-10-27 ENCOUNTER — Encounter (HOSPITAL_COMMUNITY): Payer: Self-pay | Admitting: *Deleted

## 2022-11-04 ENCOUNTER — Other Ambulatory Visit: Payer: Self-pay

## 2022-12-07 ENCOUNTER — Other Ambulatory Visit: Payer: Self-pay | Admitting: Pharmacist

## 2022-12-07 ENCOUNTER — Other Ambulatory Visit: Payer: Self-pay

## 2022-12-07 MED ORDER — NOVOLOG FLEXPEN 100 UNIT/ML ~~LOC~~ SOPN
PEN_INJECTOR | SUBCUTANEOUS | 0 refills | Status: DC
  Filled 2022-12-07: qty 3, 25d supply, fill #0
  Filled 2023-01-18: qty 3, 25d supply, fill #1
  Filled 2023-02-22: qty 3, 25d supply, fill #2

## 2022-12-14 ENCOUNTER — Other Ambulatory Visit: Payer: Self-pay

## 2023-01-05 ENCOUNTER — Inpatient Hospital Stay (HOSPITAL_COMMUNITY)
Admission: EM | Admit: 2023-01-05 | Discharge: 2023-01-09 | DRG: 641 | Disposition: A | Discharge status: Home / Self Care | Payer: Medicaid Other | Attending: Internal Medicine | Admitting: Internal Medicine

## 2023-01-05 ENCOUNTER — Encounter (HOSPITAL_COMMUNITY): Payer: Self-pay

## 2023-01-05 ENCOUNTER — Emergency Department (HOSPITAL_COMMUNITY): Payer: Medicaid Other

## 2023-01-05 ENCOUNTER — Other Ambulatory Visit: Payer: Self-pay

## 2023-01-05 DIAGNOSIS — E860 Dehydration: Secondary | ICD-10-CM | POA: Diagnosis not present

## 2023-01-05 DIAGNOSIS — Z7982 Long term (current) use of aspirin: Secondary | ICD-10-CM | POA: Diagnosis not present

## 2023-01-05 DIAGNOSIS — E1065 Type 1 diabetes mellitus with hyperglycemia: Secondary | ICD-10-CM | POA: Diagnosis present

## 2023-01-05 DIAGNOSIS — Z7901 Long term (current) use of anticoagulants: Secondary | ICD-10-CM | POA: Diagnosis not present

## 2023-01-05 DIAGNOSIS — Z794 Long term (current) use of insulin: Secondary | ICD-10-CM

## 2023-01-05 DIAGNOSIS — R112 Nausea with vomiting, unspecified: Secondary | ICD-10-CM

## 2023-01-05 DIAGNOSIS — E1022 Type 1 diabetes mellitus with diabetic chronic kidney disease: Secondary | ICD-10-CM | POA: Diagnosis present

## 2023-01-05 DIAGNOSIS — Z833 Family history of diabetes mellitus: Secondary | ICD-10-CM | POA: Diagnosis not present

## 2023-01-05 DIAGNOSIS — R1084 Generalized abdominal pain: Secondary | ICD-10-CM | POA: Diagnosis not present

## 2023-01-05 DIAGNOSIS — I4891 Unspecified atrial fibrillation: Principal | ICD-10-CM | POA: Diagnosis present

## 2023-01-05 DIAGNOSIS — E785 Hyperlipidemia, unspecified: Secondary | ICD-10-CM | POA: Diagnosis present

## 2023-01-05 DIAGNOSIS — E1040 Type 1 diabetes mellitus with diabetic neuropathy, unspecified: Secondary | ICD-10-CM | POA: Diagnosis present

## 2023-01-05 DIAGNOSIS — E10649 Type 1 diabetes mellitus with hypoglycemia without coma: Secondary | ICD-10-CM | POA: Diagnosis not present

## 2023-01-05 DIAGNOSIS — I480 Paroxysmal atrial fibrillation: Secondary | ICD-10-CM | POA: Diagnosis present

## 2023-01-05 DIAGNOSIS — Z87891 Personal history of nicotine dependence: Secondary | ICD-10-CM

## 2023-01-05 DIAGNOSIS — D638 Anemia in other chronic diseases classified elsewhere: Secondary | ICD-10-CM | POA: Diagnosis present

## 2023-01-05 DIAGNOSIS — E1165 Type 2 diabetes mellitus with hyperglycemia: Secondary | ICD-10-CM

## 2023-01-05 DIAGNOSIS — R197 Diarrhea, unspecified: Secondary | ICD-10-CM | POA: Diagnosis not present

## 2023-01-05 DIAGNOSIS — K529 Noninfective gastroenteritis and colitis, unspecified: Secondary | ICD-10-CM

## 2023-01-05 DIAGNOSIS — Z8249 Family history of ischemic heart disease and other diseases of the circulatory system: Secondary | ICD-10-CM

## 2023-01-05 DIAGNOSIS — I499 Cardiac arrhythmia, unspecified: Secondary | ICD-10-CM | POA: Diagnosis not present

## 2023-01-05 DIAGNOSIS — N1831 Chronic kidney disease, stage 3a: Secondary | ICD-10-CM | POA: Diagnosis present

## 2023-01-05 DIAGNOSIS — R739 Hyperglycemia, unspecified: Secondary | ICD-10-CM | POA: Diagnosis not present

## 2023-01-05 DIAGNOSIS — E1140 Type 2 diabetes mellitus with diabetic neuropathy, unspecified: Secondary | ICD-10-CM | POA: Diagnosis present

## 2023-01-05 DIAGNOSIS — Z8679 Personal history of other diseases of the circulatory system: Secondary | ICD-10-CM

## 2023-01-05 DIAGNOSIS — A0472 Enterocolitis due to Clostridium difficile, not specified as recurrent: Secondary | ICD-10-CM | POA: Diagnosis present

## 2023-01-05 DIAGNOSIS — Z79899 Other long term (current) drug therapy: Secondary | ICD-10-CM | POA: Diagnosis not present

## 2023-01-05 LAB — I-STAT VENOUS BLOOD GAS, ED
Acid-base deficit: 5 mmol/L — ABNORMAL HIGH (ref 0.0–2.0)
Bicarbonate: 20.6 mmol/L (ref 20.0–28.0)
Calcium, Ion: 1.18 mmol/L (ref 1.15–1.40)
HCT: 31 % — ABNORMAL LOW (ref 39.0–52.0)
Hemoglobin: 10.5 g/dL — ABNORMAL LOW (ref 13.0–17.0)
O2 Saturation: 88 %
Potassium: 4.8 mmol/L (ref 3.5–5.1)
Sodium: 140 mmol/L (ref 135–145)
TCO2: 22 mmol/L (ref 22–32)
pCO2, Ven: 40.4 mmHg — ABNORMAL LOW (ref 44–60)
pH, Ven: 7.315 (ref 7.25–7.43)
pO2, Ven: 60 mmHg — ABNORMAL HIGH (ref 32–45)

## 2023-01-05 LAB — CBC WITH DIFFERENTIAL/PLATELET
Abs Immature Granulocytes: 0.02 10*3/uL (ref 0.00–0.07)
Basophils Absolute: 0.1 10*3/uL (ref 0.0–0.1)
Basophils Relative: 1 %
Eosinophils Absolute: 0.1 10*3/uL (ref 0.0–0.5)
Eosinophils Relative: 2 %
HCT: 29.1 % — ABNORMAL LOW (ref 39.0–52.0)
Hemoglobin: 9.2 g/dL — ABNORMAL LOW (ref 13.0–17.0)
Immature Granulocytes: 0 %
Lymphocytes Relative: 21 %
Lymphs Abs: 1.6 10*3/uL (ref 0.7–4.0)
MCH: 25.5 pg — ABNORMAL LOW (ref 26.0–34.0)
MCHC: 31.6 g/dL (ref 30.0–36.0)
MCV: 80.6 fL (ref 80.0–100.0)
Monocytes Absolute: 0.4 10*3/uL (ref 0.1–1.0)
Monocytes Relative: 6 %
Neutro Abs: 5.6 10*3/uL (ref 1.7–7.7)
Neutrophils Relative %: 70 %
Platelets: 188 10*3/uL (ref 150–400)
RBC: 3.61 MIL/uL — ABNORMAL LOW (ref 4.22–5.81)
RDW: 15 % (ref 11.5–15.5)
WBC: 7.8 10*3/uL (ref 4.0–10.5)
nRBC: 0 % (ref 0.0–0.2)

## 2023-01-05 LAB — BASIC METABOLIC PANEL
Anion gap: 8 (ref 5–15)
BUN: 23 mg/dL — ABNORMAL HIGH (ref 6–20)
CO2: 20 mmol/L — ABNORMAL LOW (ref 22–32)
Calcium: 8.6 mg/dL — ABNORMAL LOW (ref 8.9–10.3)
Chloride: 110 mmol/L (ref 98–111)
Creatinine, Ser: 1.58 mg/dL — ABNORMAL HIGH (ref 0.61–1.24)
GFR, Estimated: 54 mL/min — ABNORMAL LOW (ref >60)
Glucose, Bld: 400 mg/dL — ABNORMAL HIGH (ref 70–99)
Potassium: 4.8 mmol/L (ref 3.5–5.1)
Sodium: 138 mmol/L (ref 135–145)

## 2023-01-05 LAB — HEPATIC FUNCTION PANEL
ALT: 44 U/L (ref 0–44)
AST: 28 U/L (ref 15–41)
Albumin: 3.3 g/dL — ABNORMAL LOW (ref 3.5–5.0)
Alkaline Phosphatase: 107 U/L (ref 38–126)
Bilirubin, Direct: <0.1 mg/dL (ref 0.0–0.2)
Total Bilirubin: 0.4 mg/dL (ref 0.3–1.2)
Total Protein: 6.2 g/dL — ABNORMAL LOW (ref 6.5–8.1)

## 2023-01-05 LAB — BETA-HYDROXYBUTYRIC ACID: Beta-Hydroxybutyric Acid: 0.23 mmol/L (ref 0.05–0.27)

## 2023-01-05 LAB — CBG MONITORING, ED
Glucose-Capillary: 224 mg/dL — ABNORMAL HIGH (ref 70–99)
Glucose-Capillary: 343 mg/dL — ABNORMAL HIGH (ref 70–99)
Glucose-Capillary: 351 mg/dL — ABNORMAL HIGH (ref 70–99)

## 2023-01-05 LAB — LIPASE, BLOOD: Lipase: 23 U/L (ref 11–51)

## 2023-01-05 MED ORDER — INSULIN ASPART 100 UNIT/ML IJ SOLN
0.0000 [IU] | INTRAMUSCULAR | Status: DC
  Administered 2023-01-05: 15 [IU] via SUBCUTANEOUS
  Administered 2023-01-06: 3 [IU] via SUBCUTANEOUS

## 2023-01-05 MED ORDER — HEPARIN SODIUM (PORCINE) 5000 UNIT/ML IJ SOLN
5000.0000 [IU] | Freq: Three times a day (TID) | INTRAMUSCULAR | Status: DC
  Filled 2023-01-05 (×2): qty 1

## 2023-01-05 MED ORDER — DIPHENHYDRAMINE HCL 50 MG/ML IJ SOLN
25.0000 mg | Freq: Once | INTRAMUSCULAR | Status: AC
  Administered 2023-01-05: 25 mg via INTRAVENOUS
  Filled 2023-01-05: qty 1

## 2023-01-05 MED ORDER — SODIUM CHLORIDE 0.9 % IV SOLN: Freq: Once | INTRAVENOUS | Status: DC

## 2023-01-05 MED ORDER — INSULIN GLARGINE-YFGN 100 UNIT/ML ~~LOC~~ SOLN
20.0000 [IU] | Freq: Every day | SUBCUTANEOUS | Status: DC
  Administered 2023-01-05: 20 [IU] via SUBCUTANEOUS
  Filled 2023-01-05 (×2): qty 0.2

## 2023-01-05 MED ORDER — METOCLOPRAMIDE HCL 5 MG/ML IJ SOLN
5.0000 mg | Freq: Three times a day (TID) | INTRAMUSCULAR | Status: DC
  Administered 2023-01-05 – 2023-01-06 (×2): 5 mg via INTRAVENOUS
  Filled 2023-01-05 (×2): qty 2

## 2023-01-05 MED ORDER — SODIUM CHLORIDE 0.9 % IV SOLN: INTRAVENOUS | Status: DC

## 2023-01-05 MED ORDER — PROCHLORPERAZINE EDISYLATE 10 MG/2ML IJ SOLN
10.0000 mg | Freq: Once | INTRAMUSCULAR | Status: AC
  Administered 2023-01-05: 10 mg via INTRAVENOUS
  Filled 2023-01-05: qty 2

## 2023-01-05 MED ORDER — LACTATED RINGERS IV SOLN: INTRAVENOUS | Status: DC

## 2023-01-05 MED ORDER — SODIUM CHLORIDE 0.9 % IV BOLUS
2000.0000 mL | Freq: Once | INTRAVENOUS | Status: AC
  Administered 2023-01-05: 2000 mL via INTRAVENOUS

## 2023-01-05 MED ORDER — ACETAMINOPHEN 325 MG PO TABS: 650.0000 mg | ORAL_TABLET | Freq: Four times a day (QID) | ORAL | Status: DC | PRN

## 2023-01-05 MED ORDER — ACETAMINOPHEN 650 MG RE SUPP: 650.0000 mg | Freq: Four times a day (QID) | RECTAL | Status: DC | PRN

## 2023-01-05 MED ORDER — DILTIAZEM HCL-DEXTROSE 125-5 MG/125ML-% IV SOLN (PREMIX)
5.0000 mg/h | INTRAVENOUS | Status: DC
  Administered 2023-01-05: 5 mg/h via INTRAVENOUS
  Administered 2023-01-06: 12.5 mg/h via INTRAVENOUS
  Filled 2023-01-05 (×2): qty 125

## 2023-01-05 MED ORDER — ONDANSETRON HCL 4 MG/2ML IJ SOLN
4.0000 mg | Freq: Four times a day (QID) | INTRAMUSCULAR | Status: DC
  Filled 2023-01-05 (×3): qty 2

## 2023-01-05 MED ORDER — ONDANSETRON HCL 4 MG/2ML IJ SOLN
4.0000 mg | Freq: Once | INTRAMUSCULAR | Status: AC
  Administered 2023-01-05: 4 mg via INTRAVENOUS
  Filled 2023-01-05: qty 2

## 2023-01-05 MED ORDER — DILTIAZEM LOAD VIA INFUSION
10.0000 mg | Freq: Once | INTRAVENOUS | Status: AC
  Administered 2023-01-05: 10 mg via INTRAVENOUS
  Filled 2023-01-05: qty 10

## 2023-01-05 NOTE — ED Provider Notes (Signed)
[place] EMERGENCY DEPARTMENT AT [facility] Provider Note   CSN: [id number] Arrival date & time: 01/05/23  1549     History  Chief Complaint  Patient presents with   Abdominal Pain   AFib RVR    [name] is a 47 y.o. male.  Patient here with nausea vomiting diarrhea.  Suspect may be suspicious food intake.  History of diabetes.  Multiple episodes of nausea vomiting diarrhea couple hours ago.  With EMS his heart rate was in the 150s.  Blood sugar was in the 500s.  He had not taken any insulin today.  Has a history of A-fib had a cardioversion but he is on anticoagulation.  EMS stopped he had A-fib with RVR.  He was given Zofran with EMS.  Does use marijuana.  The history is provided by the patient.       Home Medications Prior to Admission medications   Medication Sig Start Date End Date Taking? Authorizing Provider  insulin aspart (NOVOLOG FLEXPEN) 100 UNIT/ML FlexPen Inject 6 Units into the skin 2 (two) times daily before a meal. Before lunch and dinner, hold if glucose is less than 150 12/07/22   Storm Frisk, MD  Insulin Glargine [facility] KWIKPEN) 100 UNIT/ML Inject 24 Units into the skin daily. 04/12/22   Storm Frisk, MD  Insulin Pen Needle (TRUEPLUS 5-BEVEL PEN NEEDLES) 31G X 5 MM MISC Use to inject insulin 2 (two) times daily. 04/12/22   Storm Frisk, MD  rosuvastatin (CRESTOR) 20 MG tablet Take 1 tablet (20 mg total) by mouth daily. To lower cholesterol 04/12/22   Storm Frisk, MD      Allergies    Patient has no known allergies.    Review of Systems   Review of Systems  Physical Exam Updated Vital Signs BP 122/81   Pulse 96   Resp 16   Ht 6' (1.829 m)   SpO2 100%   BMI 17.90 kg/m  Physical Exam Vitals and nursing note reviewed.  Constitutional:      General: He is in acute distress.     Appearance: He is well-developed. He is ill-appearing.  HENT:     Head: Normocephalic and atraumatic.  Eyes:      Conjunctiva/sclera: Conjunctivae normal.  Cardiovascular:     Rate and Rhythm: Normal rate and regular rhythm.     Heart sounds: No murmur heard. Pulmonary:     Effort: Pulmonary effort is normal. No respiratory distress.     Breath sounds: Normal breath sounds.  Abdominal:     Palpations: Abdomen is soft.     Tenderness: There is no abdominal tenderness.  Musculoskeletal:        General: No swelling.     Cervical back: Neck supple.  Skin:    General: Skin is warm and dry.     Capillary Refill: Capillary refill takes less than 2 seconds.  Neurological:     Mental Status: He is alert.  Psychiatric:        Mood and Affect: Mood normal.     ED Results / Procedures / Treatments   Labs (all labs ordered are listed, but only abnormal results are displayed) Labs Reviewed  BASIC METABOLIC PANEL - Abnormal; Notable for the following components:      Result Value   CO2 20 (*)    Glucose, Bld 400 (*)    BUN 23 (*)    Creatinine, Ser 1.58 (*)    Calcium 8.6 (*)  GFR, Estimated 54 (*)    All other components within normal limits  CBC WITH DIFFERENTIAL/PLATELET - Abnormal; Notable for the following components:   RBC 3.61 (*)    Hemoglobin 9.2 (*)    HCT 29.1 (*)    MCH 25.5 (*)    All other components within normal limits  HEPATIC FUNCTION PANEL - Abnormal; Notable for the following components:   Total Protein 6.2 (*)    Albumin 3.3 (*)    All other components within normal limits  CBG MONITORING, ED - Abnormal; Notable for the following components:   Glucose-Capillary 343 (*)    All other components within normal limits  I-STAT VENOUS BLOOD GAS, ED - Abnormal; Notable for the following components:   pCO2, Ven 40.4 (*)    pO2, Ven 60 (*)    Acid-base deficit 5.0 (*)    HCT 31.0 (*)    Hemoglobin 10.5 (*)    All other components within normal limits  BETA-HYDROXYBUTYRIC ACID  LIPASE, BLOOD  URINALYSIS, ROUTINE W REFLEX MICROSCOPIC    EKG EKG  Interpretation  Date/Time:  Thursday January 05 2023 16:03:59 EDT Ventricular Rate:  133 PR Interval:    QRS Duration: 90 QT Interval:  328 QTC Calculation: 488 R Axis:   98 Text Interpretation: Atrial fibrillation Borderline right axis deviation Probable anteroseptal infarct, old Confirmed by Virgina Norfolk [phone]) on 01/05/2023 4:25:53 PM  Radiology DG Chest Portable 1 View  Result Date: 01/05/2023 CLINICAL DATA:  n/v/d EXAM: PORTABLE CHEST - 1 VIEW COMPARISON:  04/24/2021 FINDINGS: Cardiac silhouette is unremarkable. No pneumothorax or pleural effusion. The lungs are clear. The visualized skeletal structures are unremarkable. IMPRESSION: No acute cardiopulmonary process. Electronically Signed   By: Layla Maw M.D.   On: 01/05/2023 18:53    Procedures .Critical Care  Performed by: Virgina Norfolk, DO Authorized by: Virgina Norfolk, DO   Critical care provider statement:    Critical care time (minutes):  35   Critical care was necessary to treat or prevent imminent or life-threatening deterioration of the following conditions:  Dehydration (atrial fibrillation with rvr)   Critical care was time spent personally by me on the following activities:  Blood draw for specimens, development of treatment plan with patient or surrogate, discussions with primary provider, evaluation of patient's response to treatment, examination of patient, ordering and performing treatments and interventions, ordering and review of laboratory studies, ordering and review of radiographic studies, pulse oximetry, re-evaluation of patient's condition, review of old charts and obtaining history from patient or surrogate   Care discussed with: admitting provider       Medications Ordered in ED Medications  0.9 %  sodium chloride infusion (has no administration in time range)  diltiazem (CARDIZEM) 1 mg/mL load via infusion 10 mg (10 mg Intravenous Bolus from Bag 01/05/23 1842)    And  diltiazem (CARDIZEM) 125 mg  in dextrose 5% 125 mL (1 mg/mL) infusion (5 mg/hr Intravenous New Bag/Given 01/05/23 1841)  sodium chloride 0.9 % bolus 2,000 mL (2,000 mLs Intravenous New Bag/Given 01/05/23 1644)  prochlorperazine (COMPAZINE) injection 10 mg (10 mg Intravenous Given 01/05/23 1640)  diphenhydrAMINE (BENADRYL) injection 25 mg (25 mg Intravenous Given 01/05/23 1640)  ondansetron (ZOFRAN) injection 4 mg (4 mg Intravenous Given 01/05/23 1837)    ED Course/ Medical Decision Making/ A&P                             Medical Decision Making Amount  and/or Complexity of Data Reviewed Labs: ordered. Radiology: ordered.  Risk Prescription drug management. Decision regarding hospitalization.   [name] is here with nausea vomiting diarrhea.  Is actively dry heaving on exam.  He appears to be tachycardic in the 130s with EKG showing possibly atrial fibrillation.  My suspicion that this is stress related from what I suspect is dehydration.  Differential diagnosis could be DKA or hyperemesis from marijuana or food poisoning.  At this time we will just do aggressive IV hydration with 2 L of normal saline and give Compazine and Benadryl and see how his heart rate response to that.  Seems like his A-fib history was also secondary to when he was ill.  He is not on any anticoagulation or other rate control medications for this.  Will get CBC, CMP, lipase, blood gas, ketones.  Per my review and interpretation of labs patient is not in DKA.  Notable pneumonia on chest x-ray per my review interpretation.  Blood sugars 400.  There is no significant anemia otherwise.  Mild AKI.  He has been given fluid boluses x 2 with no great improvement of his heart rate.  His heart rate still intermittently in the 150s.  I suspect atrial fibrillation with RVR in the setting of likely foodborne illness.  He still very symptomatic.  Another episode of vomiting and loose stool in the ED.  Will start patient on IV diltiazem and admit for further  hydration and care.  This chart was dictated using voice recognition software.  Despite best efforts to proofread,  errors can occur which can change the documentation meaning.         Final Clinical Impression(s) / ED Diagnoses Final diagnoses:  Atrial fibrillation with RVR  Nausea vomiting and diarrhea    Rx / DC Orders ED Discharge Orders     None         Virgina Norfolk, DO 01/05/23 1912

## 2023-01-05 NOTE — H&P (Signed)
PCP:   Storm Frisk, MD   Chief Complaint:  Nausea, vomiting, diarrhea and abdominal pain  HPI: This is a 47 year old male with past medical history of diabetes mellitus type 2.  He presents with complaints of nausea, vomiting, diarrhea since this a.m.  He denies any evidence of blood in his emesis or stool, reporting normal brown liquid stool.  He denies fever but endorses chills.  He endorses abdominal pain.  He denies any sick contacts.  He does use marijuana, stating he last used yesterday.  He states his nausea vomiting is typical for his episodes but the diarrhea is not.  He states he has not had any sick contacts, no one else in the home has similar symptoms and his last meal was at home where he had hotdogs.  He has been taking his insulin, last dosed last night.  In the ER, patient found to be in A-fib with RVR.  He has been started on diltiazem drip.  Patient continues with nausea, vomiting and diarrhea.  Patient weak.  Glucose greater than 400.  Admission requested.  Review of Systems:  The patient denies fever, weight loss,, vision loss, decreased hearing, hoarseness, syncope, dyspnea on exertion, peripheral edema, balance deficits, hemoptysis, melena, hematochezia, severe indigestion/heartburn, hematuria, incontinence, genital sores, muscle weakness, suspicious skin lesions, transient blindness, difficulty walking, depression, unusual weight change, abnormal bleeding, enlarged lymph nodes, angioedema, and breast masses. Positives: Nausea, vomiting, diarrhea, abdominal pain, generalized weakness  Past Medical History: Past Medical History:  Diagnosis Date   AKI (acute kidney injury) 03/21/2018   Diabetes mellitus    Newly diagnosed, May 2012   Past Surgical History:  Procedure Laterality Date   APPENDECTOMY     TONSILLECTOMY      Medications: Prior to Admission medications   Medication Sig Start Date End Date Taking? Authorizing Provider  insulin aspart (NOVOLOG FLEXPEN)  100 UNIT/ML FlexPen Inject 6 Units into the skin 2 (two) times daily before a meal. Before lunch and dinner, hold if glucose is less than 150 12/07/22   Storm Frisk, MD  Insulin Glargine [facility] KWIKPEN) 100 UNIT/ML Inject 24 Units into the skin daily. 04/12/22   Storm Frisk, MD  Insulin Pen Needle (TRUEPLUS 5-BEVEL PEN NEEDLES) 31G X 5 MM MISC Use to inject insulin 2 (two) times daily. 04/12/22   Storm Frisk, MD  rosuvastatin (CRESTOR) 20 MG tablet Take 1 tablet (20 mg total) by mouth daily. To lower cholesterol 04/12/22   Storm Frisk, MD    Allergies:  No Known Allergies  Social History:  reports that he quit smoking about 7 years ago. His smoking use included cigarettes. He quit smokeless tobacco use about 7 years ago. He reports current drug use. Drug: Marijuana. He reports that he does not drink alcohol.  Family History: Family History  Problem Relation Age of Onset   Hypertension Mother    Diabetes Sister        Several brothers and sister with DM   Diabetes Brother     Physical Exam: Vitals:   01/05/23 1945 01/05/23 1947 01/05/23 1948 01/05/23 1949  BP: (!) 144/97     Pulse: (!) 126 (!) 126 (!) 122 (!) 119  Resp:  20    SpO2: 100% 100% 98% 100%  Height:        General:  Alert and oriented times three, well developed and nourished, weak, puny appearing gentleman, ill-appearing Eyes: PERRLA, pink conjunctiva, no scleral icterus ENT: Dry oral mucosa, neck  supple, no thyromegaly Lungs: clear to ascultation, no wheeze, no crackles, no use of accessory muscles Cardiovascular: regular rate and rhythm, no regurgitation, no gallops, no murmurs. No carotid bruits, no JVD Abdomen: soft, positive BS, nonspecific generalized tenderness to palpation, non-distended, no organomegaly, not an acute abdomen GU: not examined Neuro: CN II - XII grossly intact, sensation intact Musculoskeletal: strength 5/5 all extremities, no clubbing, cyanosis or edema Skin: no rash,  no subcutaneous crepitation, no decubitus Psych: appropriate patient   Labs on Admission:  Recent Labs    01/05/23 1623 01/05/23 1639  NA 138 140  K 4.8 4.8  CL 110  --   CO2 20*  --   GLUCOSE 400*  --   BUN 23*  --   CREATININE 1.58*  --   CALCIUM 8.6*  --    Recent Labs    01/05/23 1623  AST 28  ALT 44  ALKPHOS 107  BILITOT 0.4  PROT 6.2*  ALBUMIN 3.3*   Recent Labs    01/05/23 1623  LIPASE 23   Recent Labs    01/05/23 1623 01/05/23 1639  WBC 7.8  --   NEUTROABS 5.6  --   HGB 9.2* 10.5*  HCT 29.1* 31.0*  MCV 80.6  --   PLT 188  --      Radiological Exams on Admission: DG Chest Portable 1 View  Result Date: 01/05/2023 CLINICAL DATA:  n/v/d EXAM: PORTABLE CHEST - 1 VIEW COMPARISON:  04/24/2021 FINDINGS: Cardiac silhouette is unremarkable. No pneumothorax or pleural effusion. The lungs are clear. The visualized skeletal structures are unremarkable. IMPRESSION: No acute cardiopulmonary process. Electronically Signed   By: Layla Maw M.D.   On: 01/05/2023 18:53    Assessment/Plan Present on Admission:  Atrial fibrillation with RVR -Likely precipitated from dehydration.  Patient with prior history of. -Diltiazem drip drip initiated in ER, continued -CHADvasc: 1 (DM type 1): ASA daily. Begin after N/V controlled/resolved  Gastroenteritis -scheduled zofran, PRN reglan -maybe d/t THC use, except diarrhea -IVF hydration NS @ 125cc/hr -C. difficile testing and stool pathogen panel ordered, evaluating for infectious cause of diarrhea  Diabetes Mellitus type 1 w/ hyperglycemia -SSI, Lantus 20units [place] QHS ordered -CLD, advance as tolerated  CKD Stage 3 -chronic, stable at baseline -strict I/O's  HLD -crestor held  Anemia of chronic disease  [name] 01/05/2023, 8:06 PM

## 2023-01-05 NOTE — ED Triage Notes (Signed)
Pt BIB EMS for abdominal pain, N/V/D starting a few hours ago. 10/10 pain. Pt also AFIB RVR with hx of cardioversion. CBG with EMS 551. [name] he has not had any of his insulin today. Aox4.  of Zofran given by EMS.

## 2023-01-06 DIAGNOSIS — I4891 Unspecified atrial fibrillation: Secondary | ICD-10-CM | POA: Diagnosis not present

## 2023-01-06 DIAGNOSIS — Z794 Long term (current) use of insulin: Secondary | ICD-10-CM | POA: Diagnosis not present

## 2023-01-06 DIAGNOSIS — E1165 Type 2 diabetes mellitus with hyperglycemia: Secondary | ICD-10-CM | POA: Diagnosis not present

## 2023-01-06 DIAGNOSIS — K529 Noninfective gastroenteritis and colitis, unspecified: Secondary | ICD-10-CM | POA: Diagnosis not present

## 2023-01-06 LAB — CBC WITH DIFFERENTIAL/PLATELET
Abs Immature Granulocytes: 0.02 10*3/uL (ref 0.00–0.07)
Basophils Absolute: 0 10*3/uL (ref 0.0–0.1)
Basophils Relative: 0 %
Eosinophils Absolute: 0 10*3/uL (ref 0.0–0.5)
Eosinophils Relative: 0 %
HCT: 26.9 % — ABNORMAL LOW (ref 39.0–52.0)
Hemoglobin: 8.3 g/dL — ABNORMAL LOW (ref 13.0–17.0)
Immature Granulocytes: 0 %
Lymphocytes Relative: 15 %
Lymphs Abs: 1.4 10*3/uL (ref 0.7–4.0)
MCH: 24.9 pg — ABNORMAL LOW (ref 26.0–34.0)
MCHC: 30.9 g/dL (ref 30.0–36.0)
MCV: 80.8 fL (ref 80.0–100.0)
Monocytes Absolute: 0.8 10*3/uL (ref 0.1–1.0)
Monocytes Relative: 8 %
Neutro Abs: 7.5 10*3/uL (ref 1.7–7.7)
Neutrophils Relative %: 77 %
Platelets: 183 10*3/uL (ref 150–400)
RBC: 3.33 MIL/uL — ABNORMAL LOW (ref 4.22–5.81)
RDW: 14.9 % (ref 11.5–15.5)
WBC: 9.7 10*3/uL (ref 4.0–10.5)
nRBC: 0 % (ref 0.0–0.2)

## 2023-01-06 LAB — BASIC METABOLIC PANEL
Anion gap: 5 (ref 5–15)
BUN: 23 mg/dL — ABNORMAL HIGH (ref 6–20)
CO2: 21 mmol/L — ABNORMAL LOW (ref 22–32)
Calcium: 8.3 mg/dL — ABNORMAL LOW (ref 8.9–10.3)
Chloride: 116 mmol/L — ABNORMAL HIGH (ref 98–111)
Creatinine, Ser: 1.41 mg/dL — ABNORMAL HIGH (ref 0.61–1.24)
GFR, Estimated: >60 mL/min (ref >60)
Glucose, Bld: 62 mg/dL — ABNORMAL LOW (ref 70–99)
Potassium: 4 mmol/L (ref 3.5–5.1)
Sodium: 142 mmol/L (ref 135–145)

## 2023-01-06 LAB — CBG MONITORING, ED
Glucose-Capillary: 52 mg/dL — ABNORMAL LOW (ref 70–99)
Glucose-Capillary: 154 mg/dL — ABNORMAL HIGH (ref 70–99)

## 2023-01-06 LAB — MAGNESIUM: Magnesium: 2.2 mg/dL (ref 1.7–2.4)

## 2023-01-06 LAB — URINALYSIS, ROUTINE W REFLEX MICROSCOPIC
Bacteria, UA: NONE SEEN
Bilirubin Urine: NEGATIVE
Glucose, UA: >=500 mg/dL — AB
Hgb urine dipstick: NEGATIVE
Ketones, ur: NEGATIVE mg/dL
Leukocytes,Ua: NEGATIVE
Nitrite: NEGATIVE
Protein, ur: NEGATIVE mg/dL
Specific Gravity, Urine: 1.02 (ref 1.005–1.030)
pH: 5 (ref 5.0–8.0)

## 2023-01-06 LAB — C DIFFICILE QUICK SCREEN W PCR REFLEX
C Diff antigen: POSITIVE — AB
C Diff toxin: NEGATIVE

## 2023-01-06 LAB — GLUCOSE, CAPILLARY
Glucose-Capillary: 118 mg/dL — ABNORMAL HIGH (ref 70–99)
Glucose-Capillary: 168 mg/dL — ABNORMAL HIGH (ref 70–99)
Glucose-Capillary: 197 mg/dL — ABNORMAL HIGH (ref 70–99)

## 2023-01-06 LAB — HEMOGLOBIN A1C
Hgb A1c MFr Bld: 11.3 % — ABNORMAL HIGH (ref 4.8–5.6)
Mean Plasma Glucose: 277.61 mg/dL

## 2023-01-06 LAB — CLOSTRIDIUM DIFFICILE BY PCR, REFLEXED: Toxigenic C. Difficile by PCR: POSITIVE — AB

## 2023-01-06 MED ORDER — DILTIAZEM HCL ER COATED BEADS 120 MG PO CP24
120.0000 mg | ORAL_CAPSULE | Freq: Every day | ORAL | Status: DC
  Administered 2023-01-06 – 2023-01-09 (×3): 120 mg via ORAL
  Filled 2023-01-06 (×5): qty 1

## 2023-01-06 MED ORDER — INSULIN ASPART 100 UNIT/ML IJ SOLN
0.0000 [IU] | Freq: Three times a day (TID) | INTRAMUSCULAR | Status: DC
  Administered 2023-01-08: 7 [IU] via SUBCUTANEOUS

## 2023-01-06 MED ORDER — INSULIN ASPART 100 UNIT/ML IJ SOLN: 3.0000 [IU] | Freq: Three times a day (TID) | INTRAMUSCULAR | Status: DC

## 2023-01-06 MED ORDER — INSULIN ASPART 100 UNIT/ML IJ SOLN: 0.0000 [IU] | Freq: Every day | INTRAMUSCULAR | Status: DC

## 2023-01-06 MED ORDER — ASPIRIN 81 MG PO TBEC
81.0000 mg | DELAYED_RELEASE_TABLET | Freq: Every day | ORAL | Status: DC
  Administered 2023-01-06 – 2023-01-09 (×4): 81 mg via ORAL
  Filled 2023-01-06 (×4): qty 1

## 2023-01-06 MED ORDER — INSULIN GLARGINE-YFGN 100 UNIT/ML ~~LOC~~ SOLN
20.0000 [IU] | Freq: Every day | SUBCUTANEOUS | Status: DC
  Administered 2023-01-06: 20 [IU] via SUBCUTANEOUS
  Filled 2023-01-06 (×2): qty 0.2

## 2023-01-06 NOTE — Inpatient Diabetes Management (Addendum)
Inpatient Diabetes Program Recommendations  AACE/ADA: New Consensus Statement on Inpatient Glycemic Control (2015)  Target Ranges:  Prepandial:   less than 140 mg/dL      Peak postprandial:   less than 180 mg/dL (1-2 hours)      Critically ill patients:  140 - 180 mg/dL   Lab Results  Component Value Date   GLUCAP 154 (H) 01/06/2023   HGBA1C 11.3 (H) 01/06/2023    Latest Reference Range & Units 01/05/23 16:08 01/05/23 20:59 01/05/23 23:40 01/06/23 04:24 01/06/23 07:29  Glucose-Capillary 70 - 99 mg/dL 161 (H) 096 (H) Novolog 15 units 224 (H) 52 (L) 154 (H) Novolog 3 units  (H): Data is abnormally high (L): Data is abnormally low  Diabetes history: DM2 Outpatient Diabetes medications:  Basaglar 24 units hs Novolog 6 units bid (hold if CBG <150) Current orders for Inpatient glycemic control: Semglee 20 units q hs Novolog 0-15 units q 4 hrs.  Inpatient Diabetes Program Recommendations:   Currently in ED. Patient had hypoglycemia post correction of Novolog 15 units. Patient did not receive Semglee yesterday due to [facility] shows patient refused dose so requested Semglee to be changed to qd so can receive a dose this am. Please consider: -Decrease Novolog 0-9 tid, 0-5 units hs -Add Novolog 3 units tid meal coverage if eats 50% meals -Add carb mod to current diet order if appropriate  Spoke with pt about A1C 11.3 (average blood glucose 278 over the past 2-3 months) and explained what an A1C is, basic pathophysiology of DM Type 2, basic home care, basic diabetes diet nutrition principles, importance of checking CBGs and maintaining good CBG control to prevent long-term and short-term complications. Reviewed signs and symptoms of hyperglycemia and hypoglycemia and how to treat hypoglycemia at home. Also reviewed blood sugar goals at home.  Patient states he added insulin after last PCP appt and his A1c had been 16 previously.   Thank you, [name]. Henry Utsey, RN, MSN, CDE  Diabetes  Coordinator Inpatient Glycemic Control Team Team Pager [phone] (8am-5pm) 01/06/2023 10:36 AM

## 2023-01-06 NOTE — Hospital Course (Signed)
Patient is a 47 year old male with past medical history of diabetes mellitus type 2, atrial fibrillation, CKD stage III, hyperlipidemia, anemia of chronic disease presented to the hospital with nausea, vomiting diarrhea. In the ER, patient was found to be in A-fib with RVR and was on diltiazem drip.  Patient was then admitted to the hospital for further evaluation and treatment.  Assessment/Plan  Atrial fibrillation  Patient did have brief episode of RVR which was precipitated by volume depletion and was briefly on Cardizem drip.  At this time has been transitioned to oral Cardizem.  Currently sinus rhythm.  CHA2DS2-VASc score of 1.  Continue aspirin on discharge.   C. difficile diarrhea. Patient is on oral vancomycin.  Oral vancomycin will be continued for the next 8 days to complete 10-day course.  Has improved at this time.  Diabetes Mellitus type 1 w/ hyperglycemia and episodes of hypoglycemia Has improved at this time.  Patient will resume home insulin regimen on discharge.  CKD Stage 3a Likely at baseline.  Creatinine today at 1.2.   Hyperlipidemia Hold Crestor.   Anemia of chronic disease Hemoglobin of 9. 7 today. .  No mention of bleeding.  Weakness deconditioning.  Has ambulated as per the nursing staff.

## 2023-01-06 NOTE — Progress Notes (Signed)
PROGRESS NOTE    [name]  ZOX:[id number] DOB: Feb 07, 1976 DOA: 01/05/2023 PCP: Storm Frisk, MD    Brief Narrative:  Patient is a 47 year old male with past medical history of diabetes mellitus type 2, atrial fibrillation, CKD stage III, hyperlipidemia, anemia of chronic disease presented to hospital with nausea, vomiting diarrhea. In the ER, patient was found to be in A-fib with RVR and was on diltiazem drip.  Patient was then admitted hospital for further evaluation and treatment.  Assessment/Plan  Atrial fibrillation with RVR Precipitated by volume depletion.  Has been started on Cardizem drip.  CHA2DS2-VASc score of 1.  Continue aspirin.  Not on nodal blockers at home.  Will transition to oral Cardizem and discontinue Cardizem after transitioning to oral.  Will start patient on oral aspirin.   Acute gastroenteritis Continue Zofran, as needed Reglan.  Continue IV hydration.  Pending C. difficile and GI pathogen panel.  Diabetes Mellitus type 1 w/ hyperglycemia Continue Lantus sliding scale insulin, on clear liquids.  Diet has been advanced to soft at this time.  CKD Stage 3a Likely at baseline.  Creatinine today at 1.4.   Hyperlipidemia Hold Crestor.   Anemia of chronic disease Hemoglobin of 8.3.  Baseline hemoglobin around 10.5.  Likely secondary to hemodilution.  Will continue to monitor closely.  Maintain bleeding.   Weakness deconditioning.  Will ambulate the patient in the hallway.    DVT prophylaxis: heparin injection 5,000 Units Start: 01/05/23 2200   Code Status:     Code Status: Full Code  Disposition: Home likely in a.m.  Status is: Inpatient  Remains inpatient appropriate because: Acute gastroenteritis, atrial fibrillation,   Family Communication: None at bedside  Consultants:  None  Procedures:  None  Antimicrobials:  None  Anti-infectives (From admission, onward)    None      Subjective:  Today, patient was seen and examined  at bedside.  Patient states that he feels little better today.  No nausea vomiting or abdominal pain at this time.  Was on clears.    Objective: Vitals:   01/06/23 1041 01/06/23 1100 01/06/23 1102 01/06/23 1228  BP: 130/84 121/80  (!) 111/58  Pulse:  81  85  Resp:  (!) Temp:    98.2 F (36.8 C)  TempSrc:    Oral  SpO2:  100%  100%  Weight:      Height:        Intake/Output Summary (Last 24 hours) at 01/06/2023 1347 Last data filed at 01/06/2023 0749 Gross per 24 hour  Intake 29.04 ml  Output 200 ml  Net -170.96 ml   Filed Weights   01/05/23 2344  Weight: 61.2 kg    Physical Examination: Body mass index is 18.31 kg/m.   General: Thinly built, not in obvious distress ill-appearing HENT:   No scleral pallor or icterus noted. Oral mucosa is dry Chest:   Diminished breath sounds bilaterally. No crackles or wheezes.  CVS: S1 &S2 heard. No murmur.  Regular rate and rhythm. Abdomen: Soft, nonspecific generalized tenderness on palpation, nondistended.  Bowel sounds are heard.   Extremities: No cyanosis, clubbing or edema.  Peripheral pulses are palpable. Psych: Alert, awake and oriented, normal mood CNS:  No cranial nerve deficits.  Power equal in all extremities.   Skin: Warm and dry.  No rashes noted.  Data Reviewed:   CBC: Recent Labs  Lab 01/05/23 1623 01/05/23 1639 01/06/23 0137  WBC 7.8  --  9.7  NEUTROABS  5.6  --  7.5  HGB 9.2* 10.5* 8.3*  HCT 29.1* 31.0* 26.9*  MCV 80.6  --  80.8  PLT 188  --  183    Basic Metabolic Panel: Recent Labs  Lab 01/05/23 1623 01/05/23 1639 01/06/23 0137  NA 138 140 142  K 4.8 4.8 4.0  CL 110  --  116*  CO2 20*  --  21*  GLUCOSE 400*  --  62*  BUN 23*  --  23*  CREATININE 1.58*  --  1.41*  CALCIUM 8.6*  --  8.3*  MG  --   --  2.2    Liver Function Tests: Recent Labs  Lab 01/05/23 1623  AST 28  ALT 44  ALKPHOS 107  BILITOT 0.4  PROT 6.2*  ALBUMIN 3.3*     Radiology Studies: DG Chest Portable 1  View  Result Date: 01/05/2023 CLINICAL DATA:  n/v/d EXAM: PORTABLE CHEST - 1 VIEW COMPARISON:  04/24/2021 FINDINGS: Cardiac silhouette is unremarkable. No pneumothorax or pleural effusion. The lungs are clear. The visualized skeletal structures are unremarkable. IMPRESSION: No acute cardiopulmonary process. Electronically Signed   By: Layla Maw M.D.   On: 01/05/2023 18:53      LOS: 1 day    Joycelyn Das, MD [facility] Available via Epic secure chat 7am-7pm After these hours, please refer to coverage provider listed on [email or web address] 01/06/2023, 1:47 PM

## 2023-01-06 NOTE — ED Notes (Signed)
Pt blood sugar in the 50's gave some orange juice and will recheck

## 2023-01-06 NOTE — ED Notes (Signed)
ED TO INPATIENT HANDOFF REPORT  ED Nurse Name and Phone #: [phone]  S Name/Age/Gender [name] 47 y.o. male Room/Bed: 038C/038C  Code Status   Code Status: Full Code  Home/SNF/Other Home Patient oriented to: self, place, time, and situation Is this baseline? Yes   Triage Complete: Triage complete  Chief Complaint Gastroenteritis [K52.9]  Triage Note Pt BIB EMS for abdominal pain, N/V/D starting a few hours ago. 10/10 pain. Pt also AFIB RVR with hx of cardioversion. CBG with EMS 551. [name] he has not had any of his insulin today. Aox4. 4mg  of Zofran given by EMS.   Allergies No Known Allergies  Level of Care/Admitting Diagnosis ED Disposition     ED Disposition  Admit   Condition  --   Comment  Hospital Area: [facility] [[id number]]  Level of Care: Telemetry Cardiac [[id number]]  May admit patient to [facility] or [facility] if equivalent level of care is available:: No  Covid Evaluation: Confirmed COVID Negative  Diagnosis: Gastroenteritis [454098]  Admitting Physician: Gery Pray [[id number]]  Attending Physician: Gery Pray [phone]  Certification:: I certify this patient will need inpatient services for at least 2 midnights  Estimated Length of Stay: 2          B Medical/Surgery History Past Medical History:  Diagnosis Date   AKI (acute kidney injury) 03/21/2018   Diabetes mellitus    Newly diagnosed, May 2012   Past Surgical History:  Procedure Laterality Date   APPENDECTOMY     TONSILLECTOMY       A IV Location/Drains/Wounds Patient Lines/Drains/Airways Status     Active Line/Drains/Airways     Name Placement date Placement time Site Days   Peripheral IV 01/05/23 18 G Left Antecubital 01/05/23  1620  Antecubital  1   Peripheral IV 01/05/23 20 G Right Antecubital 01/05/23  1620  Antecubital  1            Intake/Output Last 24 hours  Intake/Output Summary (Last 24 hours) at 01/06/2023 0954 Last data filed at 01/06/2023  0749 Gross per 24 hour  Intake 29.04 ml  Output 200 ml  Net -170.96 ml    Labs/Imaging Results for orders placed or performed during the hospital encounter of 01/05/23 (from the past 48 hour(s))  CBG monitoring, ED     Status: Abnormal   Collection Time: 01/05/23  4:08 PM  Result Value Ref Range   Glucose-Capillary 343 (H) 70 - 99 mg/dL    Comment: Glucose reference range applies only to samples taken after fasting for at least 8 hours.  Basic metabolic panel     Status: Abnormal   Collection Time: 01/05/23  4:23 PM  Result Value Ref Range   Sodium 138 135 - 145 mmol/L   Potassium 4.8 3.5 - 5.1 mmol/L   Chloride 110 98 - 111 mmol/L   CO2 20 (L) 22 - 32 mmol/L   Glucose, Bld 400 (H) 70 - 99 mg/dL    Comment: Glucose reference range applies only to samples taken after fasting for at least 8 hours.   BUN 23 (H) 6 - 20 mg/dL   Creatinine, Ser 4.78 (H) 0.61 - 1.24 mg/dL   Calcium 8.6 (L) 8.9 - 10.3 mg/dL   GFR, Estimated 54 (L) >60 mL/min    Comment: (NOTE) Calculated using the CKD-EPI Creatinine Equation (2021)    Anion gap 8 5 - 15    Comment: Performed at [facility], [address] [place],  [place] [address]  Beta-hydroxybutyric acid     Status: None   Collection Time: 01/05/23  4:23 PM  Result Value Ref Range   Beta-Hydroxybutyric Acid 0.23 0.05 - 0.27 mmol/L    Comment: Performed at [facility], [address] [place], [place] [address]  CBC with Differential (PNL)     Status: Abnormal   Collection Time: 01/05/23  4:23 PM  Result Value Ref Range   WBC 7.8 4.0 - 10.5 K/uL   RBC 3.61 (L) 4.22 - 5.81 MIL/uL   Hemoglobin 9.2 (L) 13.0 - 17.0 g/dL   HCT 09.8 (L) 11.9 - 14.7 %   MCV 80.6 80.0 - 100.0 fL   MCH 25.5 (L) 26.0 - 34.0 pg   MCHC 31.6 30.0 - 36.0 g/dL   RDW 82.9 56.2 - 13.0 %   Platelets 188 150 - 400 K/uL   nRBC 0.0 0.0 - 0.2 %   Neutrophils Relative % 70 %   Neutro Abs 5.6 1.7 - 7.7 K/uL   Lymphocytes Relative 21 %   Lymphs Abs 1.6 0.7 - 4.0  K/uL   Monocytes Relative 6 %   Monocytes Absolute 0.4 0.1 - 1.0 K/uL   Eosinophils Relative 2 %   Eosinophils Absolute 0.1 0.0 - 0.5 K/uL   Basophils Relative 1 %   Basophils Absolute 0.1 0.0 - 0.1 K/uL   Immature Granulocytes 0 %   Abs Immature Granulocytes 0.02 0.00 - 0.07 K/uL    Comment: Performed at [facility], [address] [place], [place] [address]  Hepatic function panel     Status: Abnormal   Collection Time: 01/05/23  4:23 PM  Result Value Ref Range   Total Protein 6.2 (L) 6.5 - 8.1 g/dL   Albumin 3.3 (L) 3.5 - 5.0 g/dL   AST 28 15 - 41 U/L   ALT 44 0 - 44 U/L   Alkaline Phosphatase 107 38 - 126 U/L   Total Bilirubin 0.4 0.3 - 1.2 mg/dL   Bilirubin, Direct <4.6 0.0 - 0.2 mg/dL   Indirect Bilirubin NOT CALCULATED 0.3 - 0.9 mg/dL    Comment: Performed at [facility], [address] [place], [place] [address]  Lipase, blood     Status: None   Collection Time: 01/05/23  4:23 PM  Result Value Ref Range   Lipase 23 11 - 51 U/L    Comment: Performed at [facility], [address], [place], [place] [address]  I-Stat Venous Blood Gas, ED     Status: Abnormal   Collection Time: 01/05/23  4:39 PM  Result Value Ref Range   pH, Ven 7.315 7.25 - 7.43   pCO2, Ven 40.4 (L) 44 - 60 mmHg   pO2, Ven 60 (H) 32 - 45 mmHg   Bicarbonate 20.6 20.0 - 28.0 mmol/L   TCO2 22 22 - 32 mmol/L   O2 Saturation 88 %   Acid-base deficit 5.0 (H) 0.0 - 2.0 mmol/L   Sodium 140 135 - 145 mmol/L   Potassium 4.8 3.5 - 5.1 mmol/L   Calcium, Ion 1.18 1.15 - 1.40 mmol/L   HCT 31.0 (L) 39.0 - 52.0 %   Hemoglobin 10.5 (L) 13.0 - 17.0 g/dL   Sample type VENOUS   CBG monitoring, ED     Status: Abnormal   Collection Time: 01/05/23  8:59 PM  Result Value Ref Range   Glucose-Capillary 351 (H) 70 - 99 mg/dL    Comment: Glucose reference range  applies only to samples taken after fasting for at least 8 hours.   Comment 1 Notify RN   CBG monitoring, ED     Status: Abnormal    Collection Time: 01/05/23 11:40 PM  Result Value Ref Range   Glucose-Capillary 224 (H) 70 - 99 mg/dL    Comment: Glucose reference range applies only to samples taken after fasting for at least 8 hours.  Urinalysis, Routine w reflex microscopic -Urine, Clean Catch     Status: Abnormal   Collection Time: 01/05/23 11:54 PM  Result Value Ref Range   Color, Urine STRAW (A) YELLOW   APPearance CLEAR CLEAR   Specific Gravity, Urine 1.020 1.005 - 1.030   pH 5.0 5.0 - 8.0   Glucose, UA >=500 (A) NEGATIVE mg/dL   Hgb urine dipstick NEGATIVE NEGATIVE   Bilirubin Urine NEGATIVE NEGATIVE   Ketones, ur NEGATIVE NEGATIVE mg/dL   Protein, ur NEGATIVE NEGATIVE mg/dL   Nitrite NEGATIVE NEGATIVE   Leukocytes,Ua NEGATIVE NEGATIVE   RBC / HPF 0-5 0 - 5 RBC/hpf   WBC, UA 0-5 0 - 5 WBC/hpf   Bacteria, UA NONE SEEN NONE SEEN   Squamous Epithelial / HPF 0-5 0 - 5 /HPF   Mucus PRESENT     Comment: Performed at [facility], [address] [place], [place] [address]  Hemoglobin A1c     Status: Abnormal   Collection Time: 01/06/23  1:37 AM  Result Value Ref Range   Hgb A1c MFr Bld 11.3 (H) 4.8 - 5.6 %    Comment: (NOTE) Pre diabetes:          5.7%-6.4%  Diabetes:              >6.4%  Glycemic control for   <7.0% adults with diabetes    Mean Plasma Glucose 277.61 mg/dL    Comment: Performed at [facility], [address], [place], [place] [address]  Basic metabolic panel     Status: Abnormal   Collection Time: 01/06/23  1:37 AM  Result Value Ref Range   Sodium 142 135 - 145 mmol/L   Potassium 4.0 3.5 - 5.1 mmol/L   Chloride 116 (H) 98 - 111 mmol/L   CO2 21 (L) 22 - 32 mmol/L   Glucose, Bld 62 (L) 70 - 99 mg/dL    Comment: Glucose reference range applies only to samples taken after fasting for at least 8 hours.   BUN 23 (H) 6 - 20 mg/dL   Creatinine, Ser 0.98 (H) 0.61 - 1.24 mg/dL   Calcium 8.3 (L) 8.9 - 10.3 mg/dL   GFR, Estimated >11 >91 mL/min    Comment: (NOTE) Calculated  using the CKD-EPI Creatinine Equation (2021)    Anion gap 5 5 - 15    Comment: Performed at [facility], [address], [place], [place] [address]  Magnesium     Status: None   Collection Time: 01/06/23  1:37 AM  Result Value Ref Range   Magnesium 2.2 1.7 - 2.4 mg/dL    Comment: Performed at [facility], [address] [place], [place] [address]  CBC with Differential/Platelet     Status: Abnormal   Collection Time: 01/06/23  1:37 AM  Result Value Ref Range   WBC 9.7 4.0 - 10.5 K/uL   RBC 3.33 (L) 4.22 - 5.81 MIL/uL   Hemoglobin 8.3 (L) 13.0 - 17.0 g/dL   HCT 08.6 (L) 57.8 - 46.9 %   MCV 80.8  80.0 - 100.0 fL   MCH 24.9 (L) 26.0 - 34.0 pg   MCHC 30.9 30.0 - 36.0 g/dL   RDW 16.1 09.6 - 04.5 %   Platelets 183 150 - 400 K/uL   nRBC 0.0 0.0 - 0.2 %   Neutrophils Relative % 77 %   Neutro Abs 7.5 1.7 - 7.7 K/uL   Lymphocytes Relative 15 %   Lymphs Abs 1.4 0.7 - 4.0 K/uL   Monocytes Relative 8 %   Monocytes Absolute 0.8 0.1 - 1.0 K/uL   Eosinophils Relative 0 %   Eosinophils Absolute 0.0 0.0 - 0.5 K/uL   Basophils Relative 0 %   Basophils Absolute 0.0 0.0 - 0.1 K/uL   Immature Granulocytes 0 %   Abs Immature Granulocytes 0.02 0.00 - 0.07 K/uL    Comment: Performed at [facility], [address] [place] [address], [place], [place] [address]  CBG monitoring, ED     Status: Abnormal   Collection Time: 01/06/23  4:24 AM  Result Value Ref Range   Glucose-Capillary 52 (L) 70 - 99 mg/dL    Comment: Glucose reference range applies only to samples taken after fasting for at least 8 hours.  CBG monitoring, ED     Status: Abnormal   Collection Time: 01/06/23  7:29 AM  Result Value Ref Range   Glucose-Capillary 154 (H) 70 - 99 mg/dL    Comment: Glucose reference range applies only to samples taken after fasting for at least 8 hours.   DG Chest Portable 1 View  Result Date: 01/05/2023 CLINICAL DATA:  n/v/d EXAM: PORTABLE CHEST - 1 VIEW COMPARISON:  04/24/2021 FINDINGS: Cardiac  silhouette is unremarkable. No pneumothorax or pleural effusion. The lungs are clear. The visualized skeletal structures are unremarkable. IMPRESSION: No acute cardiopulmonary process. Electronically Signed   By: Layla Maw M.D.   On: 01/05/2023 18:53    Pending Labs Unresulted Labs (From admission, onward)     Start     Ordered   01/05/23 2335  Gastrointestinal Panel by PCR , Stool  (Gastrointestinal Panel by PCR, Stool                                                                                                                                                     **Does Not include CLOSTRIDIUM DIFFICILE testing. **If CDIFF testing is needed, place order from the "C Difficile Testing" order set.**)  Once,   R        01/05/23 2334   01/05/23 2334  C Difficile Quick Screen w PCR reflex  (C Difficile quick screen w PCR reflex panel )  Once, for 24 hours,   TIMED       References:    CDiff Information Tool   01/05/23 2333            Vitals/Pain Today's Vitals  01/06/23 0745 01/06/23 0750 01/06/23 0800 01/06/23 0930  BP: 130/73  124/80 125/82  Pulse: 87  (!) 119 75  Resp: 16  (!) 25 15  Temp:   98.1 F (36.7 C)   TempSrc:      SpO2: 100%  99% 100%  Weight:      Height:      PainSc:  0-No pain      Isolation Precautions Enteric precautions (UV disinfection)  Medications Medications  diltiazem (CARDIZEM) 1 mg/mL load via infusion 10 mg (10 mg Intravenous Bolus from Bag 01/05/23 1842)    And  diltiazem (CARDIZEM) 125 mg in dextrose 5% 125 mL (1 mg/mL) infusion (12.5 mg/hr Intravenous Infusion Verify 01/06/23 0717)  ondansetron (ZOFRAN) injection 4 mg (4 mg Intravenous Not Given 01/06/23 0501)  metoCLOPramide (REGLAN) injection 5 mg (5 mg Intravenous Not Given 01/06/23 0502)  insulin aspart (novoLOG) injection 0-15 Units (3 Units Subcutaneous Given 01/06/23 0748)  insulin glargine-yfgn (SEMGLEE) injection 20 Units (20 Units Subcutaneous Not Given 01/05/23 2342)  heparin  injection 5,000 Units (5,000 Units Subcutaneous Not Given 01/06/23 0508)  acetaminophen (TYLENOL) tablet 650 mg (has no administration in time range)    Or  acetaminophen (TYLENOL) suppository 650 mg (has no administration in time range)  0.9 %  sodium chloride infusion ( Intravenous New Bag/Given 01/06/23 0630)  sodium chloride 0.9 % bolus 2,000 mL (0 mLs Intravenous Stopped 01/05/23 2028)  prochlorperazine (COMPAZINE) injection 10 mg (10 mg Intravenous Given 01/05/23 1640)  diphenhydrAMINE (BENADRYL) injection 25 mg (25 mg Intravenous Given 01/05/23 1640)  ondansetron (ZOFRAN) injection 4 mg (4 mg Intravenous Given 01/05/23 1837)    Mobility walks     Focused Assessments Cardiac Assessment Handoff:  Cardiac Rhythm: Atrial fibrillation Lab Results  Component Value Date   CKTOTAL 76 02/07/2011   CKMB 1.1 02/07/2011   TROPONINI <0.03 03/23/2018   No results found for: "DDIMER" Does the Patient currently have chest pain? No    R Recommendations: See Admitting Provider Note  Report given to:   Additional Notes:

## 2023-01-06 NOTE — TOC Progression Note (Signed)
Transition of Care [facility]) - Progression Note    Patient Details  Name: [name] MRN: [id number] Date of Birth: October 19, 1975  [facility]) CM/SW Contact  Leone Haven, RN Phone Number: 01/06/2023, 12:07 PM  Clinical Narrative:    From home, presents with afib rvr, gatroenteritis, conts on cardizem drip and ivf's at 125, cdiff pending. TOC following.        Expected Discharge Plan and Services                                               Social Determinants of Health (SDOH) Interventions SDOH Screenings   Depression (PHQ2-9): Medium Risk (08/17/2021)  Tobacco Use: Medium Risk (01/05/2023)    Readmission Risk Interventions     No data to display

## 2023-01-06 NOTE — ED Notes (Signed)
Pt provided with breakfast tray.

## 2023-01-07 DIAGNOSIS — K529 Noninfective gastroenteritis and colitis, unspecified: Secondary | ICD-10-CM | POA: Diagnosis not present

## 2023-01-07 DIAGNOSIS — E1165 Type 2 diabetes mellitus with hyperglycemia: Secondary | ICD-10-CM | POA: Diagnosis not present

## 2023-01-07 DIAGNOSIS — Z794 Long term (current) use of insulin: Secondary | ICD-10-CM | POA: Diagnosis not present

## 2023-01-07 DIAGNOSIS — I4891 Unspecified atrial fibrillation: Secondary | ICD-10-CM | POA: Diagnosis not present

## 2023-01-07 LAB — GASTROINTESTINAL PANEL BY PCR, STOOL (REPLACES STOOL CULTURE)

## 2023-01-07 LAB — GLUCOSE, CAPILLARY
Glucose-Capillary: 49 mg/dL — ABNORMAL LOW (ref 70–99)
Glucose-Capillary: 84 mg/dL (ref 70–99)
Glucose-Capillary: 33 mg/dL — CL (ref 70–99)
Glucose-Capillary: 86 mg/dL (ref 70–99)
Glucose-Capillary: 70 mg/dL (ref 70–99)
Glucose-Capillary: 63 mg/dL — ABNORMAL LOW (ref 70–99)
Glucose-Capillary: 44 mg/dL — CL (ref 70–99)
Glucose-Capillary: 57 mg/dL — ABNORMAL LOW (ref 70–99)
Glucose-Capillary: 57 mg/dL — ABNORMAL LOW (ref 70–99)
Glucose-Capillary: 64 mg/dL — ABNORMAL LOW (ref 70–99)
Glucose-Capillary: 38 mg/dL — CL (ref 70–99)
Glucose-Capillary: 84 mg/dL (ref 70–99)
Glucose-Capillary: 107 mg/dL — ABNORMAL HIGH (ref 70–99)

## 2023-01-07 LAB — BASIC METABOLIC PANEL
Anion gap: 6 (ref 5–15)
Anion gap: 11 (ref 5–15)
BUN: 18 mg/dL (ref 6–20)
BUN: 12 mg/dL (ref 6–20)
CO2: 22 mmol/L (ref 22–32)
CO2: 21 mmol/L — ABNORMAL LOW (ref 22–32)
Calcium: 8.4 mg/dL — ABNORMAL LOW (ref 8.9–10.3)
Calcium: 8.5 mg/dL — ABNORMAL LOW (ref 8.9–10.3)
Chloride: 110 mmol/L (ref 98–111)
Chloride: 105 mmol/L (ref 98–111)
Creatinine, Ser: 1.3 mg/dL — ABNORMAL HIGH (ref 0.61–1.24)
Creatinine, Ser: 1.08 mg/dL (ref 0.61–1.24)
GFR, Estimated: >60 mL/min (ref >60)
GFR, Estimated: >60 mL/min (ref >60)
Glucose, Bld: 70 mg/dL (ref 70–99)
Glucose, Bld: 45 mg/dL — ABNORMAL LOW (ref 70–99)
Potassium: 3.8 mmol/L (ref 3.5–5.1)
Potassium: 4.2 mmol/L (ref 3.5–5.1)
Sodium: 138 mmol/L (ref 135–145)
Sodium: 137 mmol/L (ref 135–145)

## 2023-01-07 LAB — CBC
HCT: 28.6 % — ABNORMAL LOW (ref 39.0–52.0)
Hemoglobin: 9.1 g/dL — ABNORMAL LOW (ref 13.0–17.0)
MCH: 25.6 pg — ABNORMAL LOW (ref 26.0–34.0)
MCHC: 31.8 g/dL (ref 30.0–36.0)
MCV: 80.3 fL (ref 80.0–100.0)
Platelets: 211 10*3/uL (ref 150–400)
RBC: 3.56 MIL/uL — ABNORMAL LOW (ref 4.22–5.81)
RDW: 14.7 % (ref 11.5–15.5)
WBC: 12.9 10*3/uL — ABNORMAL HIGH (ref 4.0–10.5)
nRBC: 0 % (ref 0.0–0.2)

## 2023-01-07 LAB — MAGNESIUM: Magnesium: 2 mg/dL (ref 1.7–2.4)

## 2023-01-07 MED ORDER — VANCOMYCIN HCL 125 MG PO CAPS
125.0000 mg | ORAL_CAPSULE | Freq: Three times a day (TID) | ORAL | Status: DC
  Administered 2023-01-07 – 2023-01-09 (×10): 125 mg via ORAL
  Filled 2023-01-07 (×13): qty 1

## 2023-01-07 MED ORDER — DEXTROSE-NACL 5-0.9 % IV SOLN: INTRAVENOUS | Status: DC

## 2023-01-07 MED ORDER — INSULIN ASPART 100 UNIT/ML IJ SOLN
2.0000 [IU] | Freq: Three times a day (TID) | INTRAMUSCULAR | Status: DC
  Administered 2023-01-08: 2 [IU] via SUBCUTANEOUS

## 2023-01-07 MED ORDER — DEXTROSE 50 % IV SOLN: 1.0000 | Freq: Once | INTRAVENOUS | Status: AC

## 2023-01-07 MED ORDER — DEXTROSE 50 % IV SOLN
INTRAVENOUS | Status: AC
  Administered 2023-01-07: 50 mL via INTRAVENOUS
  Filled 2023-01-07: qty 50

## 2023-01-07 MED ORDER — DEXTROSE 50 % IV SOLN
INTRAVENOUS | Status: AC
  Filled 2023-01-07: qty 50

## 2023-01-07 MED ORDER — INSULIN GLARGINE-YFGN 100 UNIT/ML ~~LOC~~ SOLN
16.0000 [IU] | Freq: Every day | SUBCUTANEOUS | Status: DC
  Filled 2023-01-07: qty 0.16

## 2023-01-07 NOTE — Progress Notes (Signed)
PROGRESS NOTE    [name]  YNW:[id number] DOB: 08-28-1976 DOA: 01/05/2023 PCP: Storm Frisk, MD    Brief Narrative:  Patient is a 47 year old male with past medical history of diabetes mellitus type 2, atrial fibrillation, CKD stage III, hyperlipidemia, anemia of chronic disease presented to hospital with nausea, vomiting diarrhea. In the ER, patient was found to be in A-fib with RVR and was on diltiazem drip.  Patient was then admitted hospital for further evaluation and treatment.  Assessment/Plan  Atrial fibrillation with RVR Precipitated by volume depletion.  Has been started on Cardizem drip.  CHA2DS2-VASc score of 1.  Continue aspirin.  Not on nodal blockers at home.  Has been transitioned to oral Cardizem at this time.,  Aspirin has been added to his regimen.   C. difficile diarrhea. Continue Zofran.  Discontinue Reglan due to diarrhea.  Continue IV hydration.  Patient has had 5-6 episodes of diarrhea and PCR for C. difficile is positive so at this time we will treat as C. difficile diarrhea.  Will start the patient on oral vancomycin.  Diabetes Mellitus type 1 w/ hyperglycemia Continue Lantus sliding scale insulin,   Diet has been advanced to soft at this time.  Patient however was noted to be hypoglycemic this morning.  Had received long-acting insulin yesterday night.  Will decrease the dose of long-acting tonight.  CKD Stage 3a Likely at baseline.  Creatinine today at 1.3.   Hyperlipidemia Hold Crestor.   Anemia of chronic disease Hemoglobin of 9.1 today.  Baseline hemoglobin around 10.5.  No mention of bleeding.  Weakness deconditioning.  Will ambulate the patient in the hallway.    DVT prophylaxis: heparin injection 5,000 Units Start: 01/05/23 2200   Code Status:     Code Status: Full Code  Disposition: Home likely in 1 to 2 days.  Status is: Inpatient  Remains inpatient appropriate because: Hypoglycemia, C. difficile diarrhea,, atrial fibrillation,  clinical improvement.   Family Communication: None at bedside  Consultants:  None  Procedures:  None  Antimicrobials:  None  Anti-infectives (From admission, onward)    Start     Dose/Rate Route Frequency Ordered Stop   01/07/23 0800  vancomycin (VANCOCIN) capsule 125 mg        125 mg Oral 3 times daily before meals & bedtime 01/07/23 0712 01/17/23 0659      Subjective:  Today, patient was seen and examined at bedside.  Complains of nausea 1 episode of vomiting and has had 5-6 episodes of loose watery diarrhea.   Objective: Vitals:   01/07/23 0023 01/07/23 0523 01/07/23 0742 01/07/23 1030  BP:  136/83 (!) 146/84 (!) 149/95  Pulse: 97 83 87   Resp: Temp:  97.8 F (36.6 C) 97.6 F (36.4 C)   TempSrc:  Axillary Axillary   SpO2: 99% 100% 100%   Weight:      Height:        Intake/Output Summary (Last 24 hours) at 01/07/2023 1108 Last data filed at 01/07/2023 0925 Gross per 24 hour  Intake 2686.46 ml  Output 900 ml  Net 1786.46 ml    Filed Weights   01/05/23 2344  Weight: 61.2 kg    Physical Examination: Body mass index is 18.31 kg/m.   General: Alert awake and Communicative, thinly built, appears ill appearing.   HENT:   No scleral pallor or icterus noted. Oral mucosa is moist Chest:   Diminished breath sounds bilaterally. No crackles or wheezes.  CVS:  S1 &S2 heard. No murmur.  Regular rate and rhythm. Abdomen: Soft, nonspecific tenderness.  Bowel sounds are heard.   Extremities: No cyanosis, clubbing or edema.  Peripheral pulses are palpable. Psych: Alert, awake and oriented, normal mood CNS:  No cranial nerve deficits.  Power equal in all extremities.   Skin: Warm and dry.  No rashes noted.  Data Reviewed:   CBC: Recent Labs  Lab 01/05/23 1623 01/05/23 1639 01/06/23 0137 01/07/23 0028  WBC 7.8  --  9.7 12.9*  NEUTROABS 5.6  --  7.5  --   HGB 9.2* 10.5* 8.3* 9.1*  HCT 29.1* 31.0* 26.9* 28.6*  MCV 80.6  --  80.8 80.3  PLT 188  --   183 211     Basic Metabolic Panel: Recent Labs  Lab 01/05/23 1623 01/05/23 1639 01/06/23 0137 01/07/23 0028  NA 138 140 142 138  K 4.8 4.8 4.0 3.8  CL 110  --  116* 110  CO2 20*  --  21* 22  GLUCOSE 400*  --  62* 70  BUN 23*  --  23* 18  CREATININE 1.58*  --  1.41* 1.30*  CALCIUM 8.6*  --  8.3* 8.4*  MG  --   --  2.2 2.0     Liver Function Tests: Recent Labs  Lab 01/05/23 1623  AST 28  ALT 44  ALKPHOS 107  BILITOT 0.4  PROT 6.2*  ALBUMIN 3.3*      Radiology Studies: DG Chest Portable 1 View  Result Date: 01/05/2023 CLINICAL DATA:  n/v/d EXAM: PORTABLE CHEST - 1 VIEW COMPARISON:  04/24/2021 FINDINGS: Cardiac silhouette is unremarkable. No pneumothorax or pleural effusion. The lungs are clear. The visualized skeletal structures are unremarkable. IMPRESSION: No acute cardiopulmonary process. Electronically Signed   By: Layla Maw M.D.   On: 01/05/2023 18:53      LOS: 2 days    Joycelyn Das, MD [facility] Available via Epic secure chat 7am-7pm After these hours, please refer to coverage provider listed on [email or web address] 01/07/2023, 11:08 AM

## 2023-01-07 NOTE — Plan of Care (Signed)
  Problem: Fluid Volume: Goal: Ability to maintain a balanced intake and output will improve Outcome: Progressing   Problem: Health Behavior/Discharge Planning: Goal: Ability to identify and utilize available resources and services will improve Outcome: Progressing Goal: Ability to manage health-related needs will improve Outcome: Progressing   Problem: Skin Integrity: Goal: Risk for impaired skin integrity will decrease Outcome: Progressing   Problem: Tissue Perfusion: Goal: Adequacy of tissue perfusion will improve Outcome: Progressing   Problem: Education: Goal: Knowledge of General Education information will improve Description: Including pain rating scale, medication(s)/side effects and non-pharmacologic comfort measures Outcome: Progressing   Problem: Clinical Measurements: Goal: Respiratory complications will improve Outcome: Progressing Goal: Cardiovascular complication will be avoided Outcome: Progressing   Problem: Activity: Goal: Risk for activity intolerance will decrease Outcome: Progressing   Problem: Pain Managment: Goal: General experience of comfort will improve Outcome: Progressing   Problem: Skin Integrity: Goal: Risk for impaired skin integrity will decrease Outcome: Progressing

## 2023-01-07 NOTE — Progress Notes (Signed)
Pt nauseated. Offered schedule Zofran that was declined earlier this am. Patient wanted to wait and see if nausea would pass.

## 2023-01-07 NOTE — Progress Notes (Addendum)
Pt CBG 49- Ordered pt Hypoglycemic protocol will give 8 oz orange juice and will recheck 15 mins after drinking.  Update: 0400 CBG 84

## 2023-01-08 DIAGNOSIS — E1165 Type 2 diabetes mellitus with hyperglycemia: Secondary | ICD-10-CM | POA: Diagnosis not present

## 2023-01-08 DIAGNOSIS — K529 Noninfective gastroenteritis and colitis, unspecified: Secondary | ICD-10-CM | POA: Diagnosis not present

## 2023-01-08 DIAGNOSIS — I4891 Unspecified atrial fibrillation: Secondary | ICD-10-CM | POA: Diagnosis not present

## 2023-01-08 DIAGNOSIS — Z794 Long term (current) use of insulin: Secondary | ICD-10-CM | POA: Diagnosis not present

## 2023-01-08 LAB — BASIC METABOLIC PANEL
Anion gap: 8 (ref 5–15)
BUN: 10 mg/dL (ref 6–20)
CO2: 22 mmol/L (ref 22–32)
Calcium: 8.2 mg/dL — ABNORMAL LOW (ref 8.9–10.3)
Chloride: 106 mmol/L (ref 98–111)
Creatinine, Ser: 1.24 mg/dL (ref 0.61–1.24)
GFR, Estimated: >60 mL/min (ref >60)
Glucose, Bld: 226 mg/dL — ABNORMAL HIGH (ref 70–99)
Potassium: 3.9 mmol/L (ref 3.5–5.1)
Sodium: 136 mmol/L (ref 135–145)

## 2023-01-08 LAB — CBC
HCT: 29.5 % — ABNORMAL LOW (ref 39.0–52.0)
Hemoglobin: 9.7 g/dL — ABNORMAL LOW (ref 13.0–17.0)
MCH: 25.7 pg — ABNORMAL LOW (ref 26.0–34.0)
MCHC: 32.9 g/dL (ref 30.0–36.0)
MCV: 78 fL — ABNORMAL LOW (ref 80.0–100.0)
Platelets: 196 10*3/uL (ref 150–400)
RBC: 3.78 MIL/uL — ABNORMAL LOW (ref 4.22–5.81)
RDW: 14.6 % (ref 11.5–15.5)
WBC: 9.5 10*3/uL (ref 4.0–10.5)
nRBC: 0 % (ref 0.0–0.2)

## 2023-01-08 LAB — GLUCOSE, CAPILLARY
Glucose-Capillary: 313 mg/dL — ABNORMAL HIGH (ref 70–99)
Glucose-Capillary: 193 mg/dL — ABNORMAL HIGH (ref 70–99)
Glucose-Capillary: 151 mg/dL — ABNORMAL HIGH (ref 70–99)
Glucose-Capillary: 122 mg/dL — ABNORMAL HIGH (ref 70–99)
Glucose-Capillary: 86 mg/dL (ref 70–99)

## 2023-01-08 LAB — MAGNESIUM: Magnesium: 1.8 mg/dL (ref 1.7–2.4)

## 2023-01-08 MED ORDER — INSULIN ASPART 100 UNIT/ML IJ SOLN: 0.0000 [IU] | Freq: Every day | INTRAMUSCULAR | Status: DC

## 2023-01-08 MED ORDER — INSULIN GLARGINE-YFGN 100 UNIT/ML ~~LOC~~ SOLN: 6.0000 [IU] | Freq: Every day | SUBCUTANEOUS | Status: DC

## 2023-01-08 MED ORDER — INSULIN GLARGINE-YFGN 100 UNIT/ML ~~LOC~~ SOLN
6.0000 [IU] | Freq: Every day | SUBCUTANEOUS | Status: DC
  Administered 2023-01-08 – 2023-01-09 (×2): 6 [IU] via SUBCUTANEOUS
  Filled 2023-01-08 (×2): qty 0.06

## 2023-01-08 MED ORDER — SODIUM CHLORIDE 0.9 % IV SOLN: INTRAVENOUS | Status: DC

## 2023-01-08 MED ORDER — INSULIN ASPART 100 UNIT/ML IJ SOLN
0.0000 [IU] | Freq: Three times a day (TID) | INTRAMUSCULAR | Status: DC
  Administered 2023-01-09: 3 [IU] via SUBCUTANEOUS
  Administered 2023-01-09: 1 [IU] via SUBCUTANEOUS

## 2023-01-08 NOTE — Inpatient Diabetes Management (Signed)
Inpatient Diabetes Program Recommendations  AACE/ADA: New Consensus Statement on Inpatient Glycemic Control (2015)  Target Ranges:  Prepandial:   less than 140 mg/dL      Peak postprandial:   less than 180 mg/dL (1-2 hours)      Critically ill patients:  140 - 180 mg/dL   Lab Results  Component Value Date   GLUCAP 313 (H) 01/08/2023   HGBA1C 11.3 (H) 01/06/2023    Latest Reference Range & Units 01/07/23 07:46 01/07/23 09:23 01/07/23 11:29 01/07/23 11:58 01/07/23 12:35 01/07/23 13:57 01/07/23 14:25  Glucose-Capillary 70 - 99 mg/dL 86 70 63 (L) 44 (LL) 57 (L) 64 (L) 57 (L)  (LL): Data is critically low (L): Data is abnormally low   Latest Reference Range & Units 01/07/23 16:47 01/07/23 18:39 01/07/23 21:14 01/08/23 06:19  Glucose-Capillary 70 - 99 mg/dL 38 (LL) 84 161 (H) 096 (H)  (LL): Data is critically low (H): Data is abnormally high  Diabetes history: DM2 Outpatient Diabetes medications:  Basaglar 24 units hs Novolog 6 units bid (hold if CBG <150) Current orders for Inpatient glycemic control: Novolog 2 units tid meal coverage, Novolog 0-9 units tid, 0-5 units hs  Inpatient Diabetes Program Recommendations:   Please consider: -Add Semglee 5 units qd starting now -Decrease Novolog correction to 0-6 units tid, 0-5 units hs  Thank you, Darel Hong E. Aime Carreras, RN, MSN, CDE  Diabetes Coordinator Inpatient Glycemic Control Team Team Pager [phone] (8am-5pm) 01/08/2023 8:59 AM

## 2023-01-08 NOTE — Progress Notes (Signed)
Patient refusing scheduled Cardizem. Pt states it is not needed because his heart is okay. RN educated that patient was previously in Afib with rapid ventricular rate and the Cardizem tablet helps keep his heart in normal sinus rhythm. Pt again stated he did not need the medication.

## 2023-01-08 NOTE — Progress Notes (Signed)
PROGRESS NOTE    Arthur ALVIAR  ZOX:[id number] DOB: 07/13/1976 DOA: 01/05/2023 PCP: Storm Frisk, MD    Brief Narrative:  Patient is a 47 year old male with past medical history of diabetes mellitus type 2, atrial fibrillation, CKD stage III, hyperlipidemia, anemia of chronic disease presented to hospital with nausea, vomiting diarrhea. In the ER, patient was found to be in A-fib with RVR and was on diltiazem drip.  Patient was then admitted hospital for further evaluation and treatment.  Assessment/Plan  Atrial fibrillation with RVR Precipitated by volume depletion.  Has been started on Cardizem drip.  CHA2DS2-VASc score of 1.  Continue aspirin.  Not on nodal blockers at home.  Has been transitioned to oral Cardizem at this time.  Refused it today as per the nursing staff..,  Aspirin has been added to his regimen.   C. difficile diarrhea. Continue Zofran.  Discontinue Reglan due to diarrhea.  Continue IV hydration.  Continue oral vancomycin.  Diarrhea has slowed down some  Diabetes Mellitus type 1 w/ hyperglycemia and episodes of hypoglycemia Continue sliding scale insulin, on soft diet.  Having episodes of hypoglycemia.  Will resume long-acting insulin.  Diabetic coordinator on board.  CKD Stage 3a Likely at baseline.  Creatinine today at 1.2.   Hyperlipidemia Hold Crestor.   Anemia of chronic disease Hemoglobin of 9. 7 today. .  No mention of bleeding.  Weakness deconditioning.  Has ambulated as per the nursing staff.    DVT prophylaxis: heparin injection 5,000 Units Start: 01/05/23 2200   Code Status:     Code Status: Full Code  Disposition: Home likely on 01/09/2023 clinically improved  Status is: Inpatient  Remains inpatient appropriate because: Results of hypoglycemia, C. difficile diarrhea,, atrial fibrillation, pending clinical improvement.   Family Communication: None at bedside  Consultants:  None  Procedures:  None  Antimicrobials:   None  Anti-infectives (From admission, onward)    Start     Dose/Rate Route Frequency Ordered Stop   01/07/23 0800  vancomycin (VANCOCIN) capsule 125 mg        125 mg Oral 3 times daily before meals & bedtime 01/07/23 0712 01/17/23 0659      Subjective:  Today, patient was seen and examined at bedside.  Has had few bowel movements now.  States that he feels nauseous with medications.  Explained need for taking medications.  Has been refusing some.   Objective: Vitals:   01/07/23 1126 01/07/23 1927 01/07/23 2257 01/08/23 0343  BP: (!) 147/98 130/73 (!) 150/89 (!) 145/73  Pulse: 84  99 97  Resp: 12 14    Temp: 97.6 F (36.4 C) 98 F (36.7 C) 98.7 F (37.1 C) 99.1 F (37.3 C)  TempSrc: Oral Oral Oral Oral  SpO2: 99%  99% 99%  Weight:      Height:        Intake/Output Summary (Last 24 hours) at 01/08/2023 1148 Last data filed at 01/08/2023 0800 Gross per 24 hour  Intake 240 ml  Output 1350 ml  Net -1110 ml    Filed Weights   01/05/23 2344  Weight: 61.2 kg    Physical Examination: Body mass index is 18.31 kg/m.   General: Alert awake and Communicative, thinly built, HENT:   No scleral pallor or icterus noted. Oral mucosa is moist Chest:   Diminished breath sounds bilaterally. No crackles or wheezes.  CVS: S1 &S2 heard. No murmur.  Regular rate and rhythm. Abdomen: Soft, bowel sounds present.   Extremities: No cyanosis,  clubbing or edema.  Peripheral pulses are palpable. Psych: Alert, awake and oriented, normal mood CNS:  No cranial nerve deficits.  Power equal in all extremities.   Skin: Warm and dry.  No rashes noted.  Data Reviewed:   CBC: Recent Labs  Lab 01/05/23 1623 01/05/23 1639 01/06/23 0137 01/07/23 0028 01/08/23 0026  WBC 7.8  --  9.7 12.9* 9.5  NEUTROABS 5.6  --  7.5  --   --   HGB 9.2* 10.5* 8.3* 9.1* 9.7*  HCT 29.1* 31.0* 26.9* 28.6* 29.5*  MCV 80.6  --  80.8 80.3 78.0*  PLT 188  --  183 211 196     Basic Metabolic Panel: Recent  Labs  Lab 01/05/23 1623 01/05/23 1639 01/06/23 0137 01/07/23 0028 01/07/23 1519 01/08/23 0026  NA 138 140 142 138 137 136  K 4.8 4.8 4.0 3.8 4.2 3.9  CL 110  --  116* 110 105 106  CO2 20*  --  21* 22 21* 22  GLUCOSE 400*  --  62* 70 45* 226*  BUN 23*  --  23* CREATININE 1.58*  --  1.41* 1.30* 1.08 1.24  CALCIUM 8.6*  --  8.3* 8.4* 8.5* 8.2*  MG  --   --  2.2 2.0  --  1.8     Liver Function Tests: Recent Labs  Lab 01/05/23 1623  AST 28  ALT 44  ALKPHOS 107  BILITOT 0.4  PROT 6.2*  ALBUMIN 3.3*      Radiology Studies: No results found.    LOS: 3 days    Joycelyn Das, MD [facility] Available via Epic secure chat 7am-7pm After these hours, please refer to coverage provider listed on [email or web address] 01/08/2023, 11:48 AM

## 2023-01-09 ENCOUNTER — Other Ambulatory Visit (HOSPITAL_COMMUNITY): Payer: Self-pay

## 2023-01-09 DIAGNOSIS — A0472 Enterocolitis due to Clostridium difficile, not specified as recurrent: Secondary | ICD-10-CM

## 2023-01-09 DIAGNOSIS — Z8679 Personal history of other diseases of the circulatory system: Secondary | ICD-10-CM | POA: Diagnosis not present

## 2023-01-09 DIAGNOSIS — Z794 Long term (current) use of insulin: Secondary | ICD-10-CM | POA: Diagnosis not present

## 2023-01-09 DIAGNOSIS — E1165 Type 2 diabetes mellitus with hyperglycemia: Secondary | ICD-10-CM | POA: Diagnosis not present

## 2023-01-09 LAB — CBC
HCT: 32.6 % — ABNORMAL LOW (ref 39.0–52.0)
Hemoglobin: 10.2 g/dL — ABNORMAL LOW (ref 13.0–17.0)
MCH: 25 pg — ABNORMAL LOW (ref 26.0–34.0)
MCHC: 31.3 g/dL (ref 30.0–36.0)
MCV: 79.9 fL — ABNORMAL LOW (ref 80.0–100.0)
Platelets: 225 10*3/uL (ref 150–400)
RBC: 4.08 MIL/uL — ABNORMAL LOW (ref 4.22–5.81)
RDW: 14.1 % (ref 11.5–15.5)
WBC: 9.3 10*3/uL (ref 4.0–10.5)
nRBC: 0 % (ref 0.0–0.2)

## 2023-01-09 LAB — BASIC METABOLIC PANEL
Anion gap: 5 (ref 5–15)
BUN: 12 mg/dL (ref 6–20)
CO2: 27 mmol/L (ref 22–32)
Calcium: 8.4 mg/dL — ABNORMAL LOW (ref 8.9–10.3)
Chloride: 105 mmol/L (ref 98–111)
Creatinine, Ser: 1.18 mg/dL (ref 0.61–1.24)
GFR, Estimated: >60 mL/min (ref >60)
Glucose, Bld: 112 mg/dL — ABNORMAL HIGH (ref 70–99)
Potassium: 3.7 mmol/L (ref 3.5–5.1)
Sodium: 137 mmol/L (ref 135–145)

## 2023-01-09 LAB — MAGNESIUM: Magnesium: 1.8 mg/dL (ref 1.7–2.4)

## 2023-01-09 LAB — GLUCOSE, CAPILLARY
Glucose-Capillary: 169 mg/dL — ABNORMAL HIGH (ref 70–99)
Glucose-Capillary: 280 mg/dL — ABNORMAL HIGH (ref 70–99)

## 2023-01-09 MED ORDER — ASPIRIN 81 MG PO TBEC
81.0000 mg | DELAYED_RELEASE_TABLET | Freq: Every day | ORAL | 12 refills | Status: DC
  Filled 2023-01-09: qty 30, 30d supply, fill #0

## 2023-01-09 MED ORDER — ONDANSETRON HCL 4 MG PO TABS
4.0000 mg | ORAL_TABLET | Freq: Every day | ORAL | 1 refills | Status: DC | PRN
  Filled 2023-01-09: qty 30, 30d supply, fill #0
  Filled 2023-01-09 – 2023-05-17 (×2): qty 18, 18d supply, fill #0

## 2023-01-09 MED ORDER — DILTIAZEM HCL ER COATED BEADS 120 MG PO CP24
120.0000 mg | ORAL_CAPSULE | Freq: Every day | ORAL | 2 refills | Status: AC
  Filled 2023-01-09: qty 30, 30d supply, fill #0

## 2023-01-09 MED ORDER — VANCOMYCIN HCL 125 MG PO CAPS
125.0000 mg | ORAL_CAPSULE | Freq: Four times a day (QID) | ORAL | 0 refills | Status: AC
  Filled 2023-01-09: qty 32, 8d supply, fill #0

## 2023-01-09 NOTE — Discharge Summary (Signed)
Physician Discharge Summary  [name] ZOX:[id number] DOB: [birth year] DOA: 01/05/2023  PCP: Storm Frisk, MD  Admit date: 01/05/2023 Discharge date: 01/09/2023  Admitted From: Home  Discharge disposition: Home  Recommendations for Outpatient Follow-Up:   Follow up with your primary care provider in one week.  Check CBC, BMP, magnesium in the next visit  Discharge Diagnosis:   Principal Problem:   C. difficile diarrhea Active Problems:   Diabetic neuropathy   Type 2 diabetes mellitus with hyperglycemia, with long-term current use of insulin   History of atrial fibrillation  Discharge Condition: Improved.  Diet recommendation: Low sodium, heart healthy.  Carbohydrate-modified.    Wound care: None.  Code status: Full.   History of Present Illness:   Patient is a 47 year old male with past medical history of diabetes mellitus type 2, atrial fibrillation, CKD stage III, hyperlipidemia, anemia of chronic disease presented to the hospital with nausea, vomiting diarrhea. In the ER, patient was found to be in A-fib with RVR and was on diltiazem drip.  Patient was then admitted to the hospital for further evaluation and treatment.   Hospital Course:   Following conditions were addressed during hospitalization as listed below,  Paroxysmal Atrial fibrillation  Patient did have brief episode of RVR which was precipitated by volume depletion and was briefly on Cardizem drip.  At this time has been transitioned to oral Cardizem.  Currently sinus rhythm.  CHA2DS2-VASc score of 1.  Continue aspirin on discharge.   C. difficile diarrhea. Patient is on oral vancomycin.  Oral vancomycin will be continued for the next 8 days to complete 10-day course.  Has improved at this time.  Courage to complete the course of vancomycin.   Diabetes Mellitus type 1 w/ hyperglycemia and episodes of hypoglycemia Has improved at this time.  Patient will resume home insulin regimen on  discharge.   CKD Stage 3a Likely at baseline.  Creatinine today at 1.1.   Hyperlipidemia Resume Crestor on discharge.   Anemia of chronic disease Hemoglobin of 10.2.  No mention of bleeding.   Weakness, deconditioning.  Was ambulatory and independent.  Disposition.  At this time, patient is stable for disposition home with outpatient PCP follow-up.  Medical Consultants:   None.  Procedures:    None Subjective:   Today, patient was seen and examined at bedside and overall feels better.  Has had stools but more firmer and has less in frequency.  Discharge Exam:   Vitals:   01/09/23 0312 01/09/23 0812  BP: 136/89 136/83  Pulse: 90 92  Resp: 16 11  Temp: 98.3 F (36.8 C) 98.3 F (36.8 C)  SpO2: 100% 98%   Vitals:   01/08/23 1955 01/08/23 2315 01/09/23 0312 01/09/23 0812  BP: (!) 131/94 (!) 131/90 136/89 136/83  Pulse: 89 91 90 92  Resp: Temp: 98.7 F (37.1 C) 98.2 F (36.8 C) 98.3 F (36.8 C) 98.3 F (36.8 C)  TempSrc: Oral Oral Oral Oral  SpO2: 98% 100% 100% 98%  Weight:      Height:       Body mass index is 18.31 kg/m.  General: Alert awake, not in obvious distress, thinly built. HENT: pupils equally reacting to light,  No scleral pallor or icterus noted. Oral mucosa is moist.  Chest:  Clear breath sounds.  Diminished breath sounds bilaterally. No crackles or wheezes.  CVS: S1 &S2 heard. No murmur.  Regular rate and rhythm. Abdomen: Soft, nontender, nondistended.  Bowel sounds  are heard.   Extremities: No cyanosis, clubbing or edema.  Peripheral pulses are palpable. Psych: Alert, awake and oriented, normal mood CNS:  No cranial nerve deficits.  Power equal in all extremities.   Skin: Warm and dry.  No rashes noted.  The results of significant diagnostics from this hospitalization (including imaging, microbiology, ancillary and laboratory) are listed below for reference.     Diagnostic Studies:   DG Chest Portable 1 View  Result Date:  01/05/2023 CLINICAL DATA:  n/v/d EXAM: PORTABLE CHEST - 1 VIEW COMPARISON:  04/24/2021 FINDINGS: Cardiac silhouette is unremarkable. No pneumothorax or pleural effusion. The lungs are clear. The visualized skeletal structures are unremarkable. IMPRESSION: No acute cardiopulmonary process. Electronically Signed   By: Layla Maw M.D.   On: 01/05/2023 18:53   Labs:   Basic Metabolic Panel: Recent Labs  Lab 01/06/23 0137 01/07/23 0028 01/07/23 1519 01/08/23 0026 01/09/23 0029  NA 142 138 137 136 137  K 4.0 3.8 4.2 3.9 3.7  CL 116* 110 105 106 105  CO2 21* 22 21* 22 27  GLUCOSE 62* 70 45* 226* 112*  BUN 23* CREATININE 1.41* 1.30* 1.08 1.24 1.18  CALCIUM 8.3* 8.4* 8.5* 8.2* 8.4*  MG 2.2 2.0  --  1.8 1.8   GFR Estimated Creatinine Clearance: 67.7 mL/min (by C-G formula based on SCr of 1.18 mg/dL). Liver Function Tests: Recent Labs  Lab 01/05/23 1623  AST 28  ALT 44  ALKPHOS 107  BILITOT 0.4  PROT 6.2*  ALBUMIN 3.3*   Recent Labs  Lab 01/05/23 1623  LIPASE 23   No results for input(s): "AMMONIA" in the last 168 hours. Coagulation profile No results for input(s): "INR", "PROTIME" in the last 168 hours.  CBC: Recent Labs  Lab 01/05/23 1623 01/05/23 1639 01/06/23 0137 01/07/23 0028 01/08/23 0026 01/09/23 0029  WBC 7.8  --  9.7 12.9* 9.5 9.3  NEUTROABS 5.6  --  7.5  --   --   --   HGB 9.2* 10.5* 8.3* 9.1* 9.7* 10.2*  HCT 29.1* 31.0* 26.9* 28.6* 29.5* 32.6*  MCV 80.6  --  80.8 80.3 78.0* 79.9*  PLT 188  --  183 211 196 225   Cardiac Enzymes: No results for input(s): "CKTOTAL", "CKMB", "CKMBINDEX", "TROPONINI" in the last 168 hours. BNP: Invalid input(s): "POCBNP" CBG: Recent Labs  Lab 01/08/23 1609 01/08/23 1737 01/08/23 2126 01/09/23 0606 01/09/23 1124  GLUCAP 151* 122* 86 169* 280*   D-Dimer No results for input(s): "DDIMER" in the last 72 hours. Hgb A1c No results for input(s): "HGBA1C" in the last 72 hours. Lipid Profile No  results for input(s): "CHOL", "HDL", "LDLCALC", "TRIG", "CHOLHDL", "LDLDIRECT" in the last 72 hours. Thyroid function studies No results for input(s): "TSH", "T4TOTAL", "T3FREE", "THYROIDAB" in the last 72 hours.  Invalid input(s): "FREET3" Anemia work up No results for input(s): "VITAMINB12", "FOLATE", "FERRITIN", "TIBC", "IRON", "RETICCTPCT" in the last 72 hours. Microbiology Recent Results (from the past 240 hour(s))  C Difficile Quick Screen w PCR reflex     Status: Abnormal   Collection Time: 01/05/23 11:34 PM   Specimen: Stool  Result Value Ref Range Status   C Diff antigen POSITIVE (A) NEGATIVE Final   C Diff toxin NEGATIVE NEGATIVE Final   C Diff interpretation Results are indeterminate. See PCR results.  Final    Comment: Performed at [facility], [address] [place], [place] [address]  C. Diff by PCR, Reflexed  Status: Abnormal   Collection Time: 01/05/23 11:34 PM  Result Value Ref Range Status   Toxigenic C. Difficile by PCR POSITIVE (A) NEGATIVE Final    Comment: Positive for toxigenic C. difficile with little to no toxin production. Only treat if clinical presentation suggests symptomatic illness. Performed at [facility], [address], [place], [place] [address]   Gastrointestinal Panel by PCR , Stool     Status: None   Collection Time: 01/05/23 11:35 PM   Specimen: Stool  Result Value Ref Range Status   Campylobacter species NOT DETECTED NOT DETECTED Final   Plesimonas shigelloides NOT DETECTED NOT DETECTED Final   Salmonella species NOT DETECTED NOT DETECTED Final   Yersinia enterocolitica NOT DETECTED NOT DETECTED Final   Vibrio species NOT DETECTED NOT DETECTED Final   Vibrio cholerae NOT DETECTED NOT DETECTED Final   Enteroaggregative E coli (EAEC) NOT DETECTED NOT DETECTED Final   Enteropathogenic E coli (EPEC) NOT DETECTED NOT DETECTED Final   Enterotoxigenic E coli (ETEC) NOT DETECTED NOT DETECTED Final   Shiga like toxin producing E  coli (STEC) NOT DETECTED NOT DETECTED Final   Shigella/Enteroinvasive E coli (EIEC) NOT DETECTED NOT DETECTED Final   Cryptosporidium NOT DETECTED NOT DETECTED Final   Cyclospora cayetanensis NOT DETECTED NOT DETECTED Final   Entamoeba histolytica NOT DETECTED NOT DETECTED Final   Giardia lamblia NOT DETECTED NOT DETECTED Final   Adenovirus F40/41 NOT DETECTED NOT DETECTED Final   Astrovirus NOT DETECTED NOT DETECTED Final   Norovirus GI/GII NOT DETECTED NOT DETECTED Final   Rotavirus A NOT DETECTED NOT DETECTED Final   Sapovirus (I, II, IV, and V) NOT DETECTED NOT DETECTED Final    Comment: Performed at [facility], [address], [place], [place] [address]     Discharge Instructions:   Discharge Instructions     Call MD for:  persistant nausea and vomiting   Complete by: As directed    Call MD for:  severe uncontrolled pain   Complete by: As directed    Call MD for:  temperature >100.4   Complete by: As directed    Diet - low sodium heart healthy   Complete by: As directed    Diet Carb Modified   Complete by: As directed    Discharge instructions   Complete by: As directed    Follow-up with your primary care provider in 1 week.  Check blood work at that time.  Complete course of vancomycin.  Seek medical attention for worsening symptoms.  Take medications as prescribed.   Increase activity slowly   Complete by: As directed       Allergies as of 01/09/2023   No Known Allergies      Medication List     TAKE these medications    Aspirin Low Dose 81 MG tablet Generic drug: aspirin EC Take 1 tablet (81 mg total) by mouth daily. Swallow whole.   Basaglar KwikPen 100 UNIT/ML Inject 24 Units into the skin daily.   diltiazem 120 MG 24 hr capsule Commonly known as: CARDIZEM CD Take 1 capsule (120 mg total) by mouth daily.   NovoLOG FlexPen 100 UNIT/ML FlexPen Generic drug: insulin aspart Inject 6 Units into the skin 2 (two) times daily before a meal.  Before lunch and dinner, hold if glucose is less than 150 What changed:  how much to take how to take this when to take this additional instructions   ondansetron 4 MG tablet Commonly known as: Zofran  Take 1 tablet (4 mg total) by mouth daily as needed for nausea or vomiting.   rosuvastatin 20 MG tablet Commonly known as: Crestor Take 1 tablet (20 mg total) by mouth daily. To lower cholesterol   TRUEplus 5-Bevel Pen Needles 31G X 5 MM Misc Generic drug: Insulin Pen Needle Use to inject insulin 2 (two) times daily.   vancomycin 125 MG capsule Commonly known as: VANCOCIN Take 1 capsule (125 mg total) by mouth 4 (four) times daily for 8 days.        Follow-up Information     Storm Frisk, MD. Call in 3 day(s).   Specialty: Pulmonary Disease Why: PCP will call you to schedule a high priority follow up in a couple of days at the number on file [phone]. If you do not hear from them by 4/25 please call them at [phone] to schedule a follow up. Contact information: [address] [place] [address] [phone]                 Time coordinating discharge: 39 minutes  Signed:  Ramia Sidney  [facility] 01/09/2023, 2:26 PM

## 2023-01-10 ENCOUNTER — Telehealth: Payer: Self-pay

## 2023-01-10 NOTE — Transitions of Care (Post Inpatient/ED Visit) (Signed)
   01/10/2023  Name: [name] MRN: [id number] DOB: Sep 05, 1976  Today's TOC FU Call Status: Today's TOC FU Call Status:: Unsuccessul Call (1st Attempt) Unsuccessful Call (1st Attempt) Date: 01/10/23  Attempted to reach the patient regarding the most recent Inpatient/ED visit.  Follow Up Plan: Additional outreach attempts will be made to reach the patient to complete the Transitions of Care (Post Inpatient/ED visit) call.     Antionette Fairy, RN,BSN,CCM [facility] Care Management Care Management Community Coordinator Direct Phone: [phone] Toll Free: [phone] Fax: [phone]

## 2023-01-11 ENCOUNTER — Telehealth: Payer: Self-pay

## 2023-01-11 NOTE — Transitions of Care (Post Inpatient/ED Visit) (Signed)
   01/11/2023  Name: [name] MRN: [id number] DOB: Oct 09, 1975  Today's TOC FU Call Status: Today's TOC FU Call Status:: Successful TOC FU Call Competed TOC FU Call Complete Date: 01/11/23  Transition Care Management Follow-up Telephone Call Date of Discharge: 01/09/23 Discharge Facility: [facility]) Type of Discharge: Inpatient Admission Primary Inpatient Discharge Diagnosis:: "A-fib w/ RVR" How have you been since you were released from the hospital?: Better (Pt states he is "about back to 85% normal-getting stronger daily." He has been able to resume previous activities.) Any questions or concerns?: No  Items Reviewed: Did you receive and understand the discharge instructions provided?: Yes Medications obtained and verified?: Yes (Medications Reviewed) Any new allergies since your discharge?: No Dietary orders reviewed?: Yes Type of Diet Ordered:: low salt/heart healthy Do you have support at home?: No  Home Care and Equipment/Supplies: Were Home Health Services Ordered?: NA Any new equipment or medical supplies ordered?: NA  Functional Questionnaire: Do you need assistance with bathing/showering or dressing?: No Do you need assistance with meal preparation?: No Do you need assistance with eating?: No Do you have difficulty maintaining continence: No Do you need assistance with getting out of bed/getting out of a chair/moving?: No Do you have difficulty managing or taking your medications?: No  Follow up appointments reviewed: PCP Follow-up appointment confirmed?: Yes Date of PCP follow-up appointment?: 01/26/23 (pt states this was soonest appt available-he is on wait list for any openings/cancellations) Follow-up Provider: [place] [place] [facility] Follow-up appointment confirmed?: NA Do you need transportation to your follow-up appointment?: No (pt stsates he will get family/friends to take him to appts, reviewed with pt Medicaid transportation and  instructed on how to secure ride through insurance) Do you understand care options if your condition(s) worsen?: Yes-patient verbalized understanding  SDOH Interventions Today    Flowsheet Row Most Recent Value  SDOH Interventions   Food Insecurity Interventions Intervention Not Indicated  Transportation Interventions Intervention Not Indicated      TOC Interventions Today    Flowsheet Row Most Recent Value  TOC Interventions   TOC Interventions Discussed/Reviewed TOC Interventions Discussed      Interventions Today    Flowsheet Row Most Recent Value  Chronic Disease   Chronic disease during today's visit Atrial Fibrillation (AFib)  General Interventions   General Interventions Discussed/Reviewed General Interventions Discussed, Doctor Visits  Doctor Visits Discussed/Reviewed Doctor Visits Discussed, PCP  PCP/Specialist Visits Compliance with follow-up visit  Education Interventions   Education Provided Provided Education  Provided Verbal Education On When to see the doctor, Medication  Nutrition Interventions   Nutrition Discussed/Reviewed Nutrition Discussed, Adding fruits and vegetables, Increaing proteins, Decreasing salt  Pharmacy Interventions   Pharmacy Dicussed/Reviewed Pharmacy Topics Discussed, Medications and their functions       Alessandra Grout [facility] Care Management Care Management Community Coordinator Direct Phone: [phone] Toll Free: [phone] Fax: [phone]

## 2023-01-18 ENCOUNTER — Other Ambulatory Visit: Payer: Self-pay

## 2023-01-18 ENCOUNTER — Other Ambulatory Visit: Payer: Self-pay | Admitting: Pharmacist

## 2023-01-18 MED ORDER — LANTUS SOLOSTAR 100 UNIT/ML ~~LOC~~ SOPN
24.0000 [IU] | PEN_INJECTOR | Freq: Every day | SUBCUTANEOUS | 0 refills | Status: DC
  Filled 2023-01-18 (×2): qty 6, 25d supply, fill #0

## 2023-01-18 MED ORDER — INSULIN GLARGINE SOLOSTAR 100 UNIT/ML ~~LOC~~ SOPN
24.0000 [IU] | PEN_INJECTOR | Freq: Every day | SUBCUTANEOUS | 0 refills | Status: DC
  Filled 2023-01-18: qty 6, 25d supply, fill #0
  Filled 2023-05-16 (×9): qty 6, 25d supply, fill #1

## 2023-01-25 NOTE — Progress Notes (Deleted)
Patient ID: [name], male   DOB: 08/16/1976, 47 y.o.   MRN: [id number]   Principal Problem:   C. difficile diarrhea Active Problems:   Diabetic neuropathy   Type 2 diabetes mellitus with hyperglycemia, with long-term current use of insulin   History of atrial fibrillation  Patient is a 47 year old male with past medical history of diabetes mellitus type 2, atrial fibrillation, CKD stage III, hyperlipidemia, anemia of chronic disease presented to the hospital with nausea, vomiting diarrhea. In the ER, patient was found to be in A-fib with RVR and was on diltiazem drip.  Patient was then admitted to the hospital for further evaluation and treatment.    Paroxysmal Atrial fibrillation  Patient did have brief episode of RVR which was precipitated by volume depletion and was briefly on Cardizem drip.  At this time has been transitioned to oral Cardizem.  Currently sinus rhythm.  CHA2DS2-VASc score of 1.  Continue aspirin on discharge.   C. difficile diarrhea. Patient is on oral vancomycin.  Oral vancomycin will be continued for the next 8 days to complete 10-day course.  Has improved at this time.  Courage to complete the course of vancomycin.   Diabetes Mellitus type 1 w/ hyperglycemia and episodes of hypoglycemia Has improved at this time.  Patient will resume home insulin regimen on discharge.   CKD Stage 3a Likely at baseline.  Creatinine today at 1.1.   Hyperlipidemia Resume Crestor on discharge.   Anemia of chronic disease Hemoglobin of 10.2.  No mention of bleeding.   Weakness, deconditioning.  Was ambulatory and independent.   Disposition.  At this time, patient is stable for disposition home with outpatient PCP follow-up.

## 2023-01-26 ENCOUNTER — Inpatient Hospital Stay: Payer: Medicaid Other | Admitting: Physician Assistant

## 2023-01-26 DIAGNOSIS — E1165 Type 2 diabetes mellitus with hyperglycemia: Secondary | ICD-10-CM

## 2023-02-02 ENCOUNTER — Other Ambulatory Visit (HOSPITAL_COMMUNITY): Payer: Self-pay

## 2023-02-22 ENCOUNTER — Other Ambulatory Visit: Payer: Self-pay

## 2023-02-27 ENCOUNTER — Emergency Department (HOSPITAL_COMMUNITY)
Admission: EM | Admit: 2023-02-27 | Discharge: 2023-02-27 | Disposition: A | Discharge status: Home / Self Care | Payer: 59 | Attending: Emergency Medicine | Admitting: Emergency Medicine

## 2023-02-27 ENCOUNTER — Ambulatory Visit: Payer: Self-pay | Admitting: *Deleted

## 2023-02-27 ENCOUNTER — Other Ambulatory Visit: Payer: Self-pay

## 2023-02-27 ENCOUNTER — Encounter (HOSPITAL_COMMUNITY): Payer: Self-pay | Admitting: Emergency Medicine

## 2023-02-27 ENCOUNTER — Ambulatory Visit: Payer: Self-pay

## 2023-02-27 DIAGNOSIS — Z794 Long term (current) use of insulin: Secondary | ICD-10-CM | POA: Diagnosis not present

## 2023-02-27 DIAGNOSIS — R739 Hyperglycemia, unspecified: Secondary | ICD-10-CM | POA: Diagnosis not present

## 2023-02-27 DIAGNOSIS — Z7982 Long term (current) use of aspirin: Secondary | ICD-10-CM | POA: Insufficient documentation

## 2023-02-27 DIAGNOSIS — R42 Dizziness and giddiness: Secondary | ICD-10-CM | POA: Diagnosis not present

## 2023-02-27 DIAGNOSIS — E1165 Type 2 diabetes mellitus with hyperglycemia: Secondary | ICD-10-CM | POA: Diagnosis not present

## 2023-02-27 LAB — BASIC METABOLIC PANEL
Anion gap: 15 (ref 5–15)
BUN: 30 mg/dL — ABNORMAL HIGH (ref 6–20)
CO2: 23 mmol/L (ref 22–32)
Calcium: 9.2 mg/dL (ref 8.9–10.3)
Chloride: 95 mmol/L — ABNORMAL LOW (ref 98–111)
Creatinine, Ser: 1.65 mg/dL — ABNORMAL HIGH (ref 0.61–1.24)
GFR, Estimated: 52 mL/min — ABNORMAL LOW (ref >60)
Glucose, Bld: 417 mg/dL — ABNORMAL HIGH (ref 70–99)
Potassium: 4.5 mmol/L (ref 3.5–5.1)
Sodium: 133 mmol/L — ABNORMAL LOW (ref 135–145)

## 2023-02-27 LAB — URINALYSIS, ROUTINE W REFLEX MICROSCOPIC
Bilirubin Urine: NEGATIVE
Glucose, UA: >=500 mg/dL — AB
Ketones, ur: 20 mg/dL — AB
Leukocytes,Ua: NEGATIVE
Nitrite: NEGATIVE
Protein, ur: 100 mg/dL — AB
Specific Gravity, Urine: 1.029 (ref 1.005–1.030)
pH: 5 (ref 5.0–8.0)

## 2023-02-27 LAB — CBC
HCT: 34.9 % — ABNORMAL LOW (ref 39.0–52.0)
Hemoglobin: 10.7 g/dL — ABNORMAL LOW (ref 13.0–17.0)
MCH: 24.7 pg — ABNORMAL LOW (ref 26.0–34.0)
MCHC: 30.7 g/dL (ref 30.0–36.0)
MCV: 80.4 fL (ref 80.0–100.0)
Platelets: 266 10*3/uL (ref 150–400)
RBC: 4.34 MIL/uL (ref 4.22–5.81)
RDW: 14 % (ref 11.5–15.5)
WBC: 7.3 10*3/uL (ref 4.0–10.5)
nRBC: 0 % (ref 0.0–0.2)

## 2023-02-27 LAB — CBG MONITORING, ED
Glucose-Capillary: 390 mg/dL — ABNORMAL HIGH (ref 70–99)
Glucose-Capillary: 330 mg/dL — ABNORMAL HIGH (ref 70–99)
Glucose-Capillary: 292 mg/dL — ABNORMAL HIGH (ref 70–99)
Glucose-Capillary: 269 mg/dL — ABNORMAL HIGH (ref 70–99)

## 2023-02-27 MED ORDER — LANTUS SOLOSTAR 100 UNIT/ML ~~LOC~~ SOPN
24.0000 [IU] | PEN_INJECTOR | Freq: Every day | SUBCUTANEOUS | 0 refills | Status: DC
  Filled 2023-02-27 – 2023-05-16 (×2): qty 15, 62d supply, fill #0

## 2023-02-27 MED ORDER — HYDRALAZINE HCL 20 MG/ML IJ SOLN
5.0000 mg | Freq: Once | INTRAMUSCULAR | Status: DC
  Filled 2023-02-27: qty 1

## 2023-02-27 MED ORDER — NOVOLOG FLEXPEN 100 UNIT/ML ~~LOC~~ SOPN
6.0000 [IU] | PEN_INJECTOR | Freq: Two times a day (BID) | SUBCUTANEOUS | 0 refills | Status: DC
  Filled 2023-02-27: qty 15, fill #0

## 2023-02-27 MED ORDER — INSULIN ASPART 100 UNIT/ML IJ SOLN
5.0000 [IU] | Freq: Once | INTRAMUSCULAR | Status: AC
  Administered 2023-02-27: 5 [IU] via SUBCUTANEOUS

## 2023-02-27 MED ORDER — TRUEPLUS 5-BEVEL PEN NEEDLES 31G X 5 MM MISC
1.0000 | Freq: Two times a day (BID) | 3 refills | Status: DC
  Filled 2023-02-27: qty 100, 50d supply, fill #0

## 2023-02-27 MED ORDER — NOVOLOG FLEXPEN 100 UNIT/ML ~~LOC~~ SOPN: 6.0000 [IU] | PEN_INJECTOR | Freq: Two times a day (BID) | SUBCUTANEOUS | 0 refills | Status: DC

## 2023-02-27 MED ORDER — TRUEPLUS 5-BEVEL PEN NEEDLES 31G X 5 MM MISC: 1.0000 | Freq: Two times a day (BID) | 3 refills | Status: DC

## 2023-02-27 MED ORDER — LANTUS SOLOSTAR 100 UNIT/ML ~~LOC~~ SOPN
24.0000 [IU] | PEN_INJECTOR | Freq: Every day | SUBCUTANEOUS | 0 refills | Status: DC
  Filled 2023-05-17: qty 15, 62d supply, fill #0
  Filled 2023-05-17 (×2): qty 6, 25d supply, fill #0

## 2023-02-27 MED ORDER — SODIUM CHLORIDE 0.9 % IV BOLUS
1000.0000 mL | Freq: Once | INTRAVENOUS | Status: AC
  Administered 2023-02-27: 1000 mL via INTRAVENOUS

## 2023-02-27 NOTE — ED Triage Notes (Signed)
Pt from home via [facility] with reports of hyperglycemia. Pt states his Dr. Kem Parkinson his long acting insulin on Thursday and since that time is BS has been elevated. Pts BS today over 400 per EMS.

## 2023-02-27 NOTE — Discharge Instructions (Signed)
You have been seen and discharged from the emergency department.  You were treated for your high blood sugar with fluids and insulin.  Your insulin prescriptions have been refilled.  Follow-up with your primary provider for further evaluation and further care. Take home medications as prescribed. If you have any worsening symptoms or further concerns for your health please return to an emergency department for further evaluation.

## 2023-02-27 NOTE — Telephone Encounter (Signed)
Noted  

## 2023-02-27 NOTE — Telephone Encounter (Signed)
  Chief Complaint: high BS Symptoms: BS >300 this am 382. Very lightheaded, diarrhea Frequency: weekend Pertinent Negatives: Patient denies difficulty breathing, vomiting Disposition: [x] ED /[] Urgent Care (no appt availability in office) / [] Appointment(In office/virtual)/ []  [facility]/ [] Home Care/ [] Refused Recommended Disposition /[] [facility]/ []  Follow-up with PCP Additional Notes: pt stated Basaglar was dc'd and his BS are very high now. Sounded weak over the phone.  Reason for Disposition  [1] Blood glucose > 240 mg/dL (40.9 mmol/L) AND [8] vomiting AND [3] unable to check for ketones (in blood or urine)    Pt also with diarrhea - no vomiting  Answer Assessment - Initial Assessment Questions 1. BLOOD GLUCOSE: "What is your blood glucose level?"      382 2. ONSET: "When did you check the blood glucose?"     This am  3. USUAL RANGE: "What is your glucose level usually?" (e.g., usual fasting morning value, usual evening value)     100-225 4. KETONES: "Do you check for ketones (urine or blood test strips)?" If Yes, ask: "What does the test show now?"      N/a 5. TYPE 1 or 2:  "Do you know what type of diabetes you have?"  (e.g., Type 1, Type 2, Gestational; doesn't know)      Type 2  6. INSULIN: "Do you take insulin?" "What type of insulin(s) do you use? What is the mode of delivery? (syringe, pen; injection or pump)?"      Yes Novolog not working 7. DIABETES PILLS: "Do you take any pills for your diabetes?" If Yes, ask: "Have you missed taking any pills recently?"     *No Answer* 8. OTHER SYMPTOMS: "Do you have any symptoms?" (e.g., fever, frequent urination, difficulty breathing, dizziness, weakness, vomiting)     Lightheaded, diarrhea, dizziness  Protocols used: Diabetes - High Blood Sugar-A-AH

## 2023-02-27 NOTE — Telephone Encounter (Signed)
  Chief Complaint: worsening chest pain currently in ED Symptoms: chest pain , difficulty breathing, reports he thinks he has elevated blood sugar. Lightheaded. Feels like passing out.  Frequency: na Pertinent Negatives: Patient denies na Disposition: [x] ED /[] Urgent Care (no appt availability in office) / [] Appointment(In office/virtual)/ []  [place] [facility]/ [] Home Care/ [] Refused Recommended Disposition /[] [place] [facility]/ []  Follow-up with PCP Additional Notes:   Already triaged by another nurse and waiting to be seen in ED now at [facility]. Patient c/o chest pain while requesting refill of novolog flexpen. Needs appt. Instructed patient to get attention of staff or security  to get assistance to report worsen sx. Called ED and spoke with Debbie. Debbie reports pt. vitals stable at this time and aware patient status now . Please advise regarding novolog refills.    Reason for Disposition  [1] Chest pain lasts > 5 minutes AND [2] age > 30 AND [3] one or more cardiac risk factors (e.g., diabetes, high blood pressure, high cholesterol, smoker, or strong family history of heart disease)  Answer Assessment - Initial Assessment Questions 1. LOCATION: "Where does it hurt?"       Chest pain currently in ED 2. RADIATION: "Does the pain go anywhere else?" (e.g., into neck, jaw, arms, back)     Na  3. ONSET: "When did the chest pain begin?" (Minutes, hours or days)     na 4. PATTERN: "Does the pain come and go, or has it been constant since it started?"  "Does it get worse with exertion?"      Now  5. DURATION: "How long does it last" (e.g., seconds, minutes, hours)     na 6. SEVERITY: "How bad is the pain?"  (e.g., Scale 1-10; mild, moderate, or severe)    - MILD (1-3): doesn't interfere with normal activities     - MODERATE (4-7): interferes with normal activities or awakens from sleep    - SEVERE (8-10): excruciating pain, unable to do any normal activities       Na  7.  CARDIAC RISK FACTORS: "Do you have any history of heart problems or risk factors for heart disease?" (e.g., angina, prior heart attack; diabetes, high blood pressure, high cholesterol, smoker, or strong family history of heart disease)     See hx  8. PULMONARY RISK FACTORS: "Do you have any history of lung disease?"  (e.g., blood clots in lung, asthma, emphysema, birth control pills)     Hx  9. CAUSE: "What do you think is causing the chest pain?"     High blood sugar 10. OTHER SYMPTOMS: "Do you have any other symptoms?" (e.g., dizziness, nausea, vomiting, sweating, fever, difficulty breathing, cough)       Chest pain difficulty breathing, lightheaded 11. PREGNANCY: "Is there any chance you are pregnant?" "When was your last menstrual period?"       Na  Protocols used: Chest Pain-A-AH

## 2023-02-27 NOTE — ED Provider Notes (Signed)
[place] EMERGENCY DEPARTMENT AT [facility] Provider Note   CSN: [id number] Arrival date & time: 02/27/23  1136     History  Chief Complaint  Patient presents with   Hyperglycemia    [name] is a 47 y.o. male.  HPI   47 year old male with past medical history of diabetes presents emergency department concern for hyperglycemia.  Patient states that he has not been taking his long-acting insulin, does not reveal a specific reason why.  He is abrasive and a poor historian.  At 1 point told me "I just need my fucking medicine".  States that he feels generally unwell, weak with nausea but denies any vomiting/diarrhea.  Otherwise denies any acute complaints including chest pain.  Home Medications Prior to Admission medications   Medication Sig Start Date End Date Taking? Authorizing Provider  aspirin EC 81 MG tablet Take 1 tablet (81 mg total) by mouth daily. Swallow whole. 01/09/23   Pokhrel, Rebekah Chesterfield, MD  diltiazem (CARDIZEM CD) 120 MG 24 hr capsule Take 1 capsule (120 mg total) by mouth daily. 01/09/23 04/09/23  Pokhrel, Rebekah Chesterfield, MD  insulin aspart (NOVOLOG FLEXPEN) 100 UNIT/ML FlexPen Inject 6 Units into the skin 2 (two) times daily before a meal. Before lunch and dinner, hold if glucose is less than 150 Patient taking differently: Inject 6 Units into the skin 2 (two) times daily. 12/07/22   Storm Frisk, MD  insulin glargine (LANTUS SOLOSTAR) 100 UNIT/ML Solostar Pen Inject 24 Units into the skin daily. 01/18/23   Storm Frisk, MD  Insulin Pen Needle (TRUEPLUS 5-BEVEL PEN NEEDLES) 31G X 5 MM MISC Use to inject insulin 2 (two) times daily. 04/12/22   Storm Frisk, MD  ondansetron (ZOFRAN) 4 MG tablet Take 1 tablet (4 mg total) by mouth daily as needed for nausea or vomiting. 01/09/23 01/09/24  Pokhrel, Rebekah Chesterfield, MD  rosuvastatin (CRESTOR) 20 MG tablet Take 1 tablet (20 mg total) by mouth daily. To lower cholesterol Patient not taking: Reported on 01/05/2023 04/12/22    Storm Frisk, MD      Allergies    Patient has no known allergies.    Review of Systems   Review of Systems  Constitutional:  Negative for fever.  Respiratory:  Negative for shortness of breath.   Cardiovascular:  Negative for chest pain.  Gastrointestinal:  Positive for nausea. Negative for diarrhea and vomiting.    Physical Exam Updated Vital Signs BP (!) 172/107   Pulse 82   Temp 97.6 F (36.4 C)   Resp 16   SpO2 100%  Physical Exam Vitals and nursing note reviewed.  Constitutional:      General: He is not in acute distress.    Appearance: Normal appearance.  HENT:     Head: Normocephalic.     Mouth/Throat:     Mouth: Mucous membranes are moist.  Cardiovascular:     Rate and Rhythm: Normal rate.  Pulmonary:     Effort: Pulmonary effort is normal. No respiratory distress.  Abdominal:     Palpations: Abdomen is soft.     Tenderness: There is no abdominal tenderness.  Skin:    General: Skin is warm.  Neurological:     Mental Status: He is alert and oriented to person, place, and time. Mental status is at baseline.  Psychiatric:        Mood and Affect: Mood normal.     Comments: Rude and abrasive, cussing frequently, somewhat redirectable  ED Results / Procedures / Treatments   Labs (all labs ordered are listed, but only abnormal results are displayed) Labs Reviewed  BASIC METABOLIC PANEL - Abnormal; Notable for the following components:      Result Value   Sodium 133 (*)    Chloride 95 (*)    Glucose, Bld 417 (*)    BUN 30 (*)    Creatinine, Ser 1.65 (*)    GFR, Estimated 52 (*)    All other components within normal limits  CBC - Abnormal; Notable for the following components:   Hemoglobin 10.7 (*)    HCT 34.9 (*)    MCH 24.7 (*)    All other components within normal limits  URINALYSIS, ROUTINE W REFLEX MICROSCOPIC - Abnormal; Notable for the following components:   Glucose, UA >=500 (*)    Hgb urine dipstick SMALL (*)    Ketones, ur 20  (*)    Protein, ur 100 (*)    Bacteria, UA RARE (*)    All other components within normal limits  CBG MONITORING, ED - Abnormal; Notable for the following components:   Glucose-Capillary 390 (*)    All other components within normal limits  CBG MONITORING, ED    EKG None  Radiology No results found.  Procedures Procedures    Medications Ordered in ED Medications  sodium chloride 0.9 % bolus 1,000 mL (1,000 mLs Intravenous New Bag/Given 02/27/23 1737)  insulin aspart (novoLOG) injection 5 Units (5 Units Subcutaneous Given 02/27/23 1737)    ED Course/ Medical Decision Making/ A&P                             Medical Decision Making Amount and/or Complexity of Data Reviewed Labs: ordered.  Risk OTC drugs. Prescription drug management.   47 year old male presents emergency department with hyperglycemia after being out of his insulin.  Vital signs are stable on arrival.  Blood work shows hyperglycemia without findings of DKA.  Patient was given IV hydration and doses of insulin his blood sugar appropriately down trended.  On reevaluation has been able to eat and drink without difficulty.  I have refilled all of his insulin prescriptions to 24-hour pharmacy.  On reevaluation blood sugar continues to downtrend, patient feels well and will fill his prescriptions.  Patient at this time appears safe and stable for discharge and close outpatient follow up. Discharge plan and strict return to ED precautions discussed, patient verbalizes understanding and agreement.        Final Clinical Impression(s) / ED Diagnoses Final diagnoses:  None    Rx / DC Orders ED Discharge Orders     None         Rozelle Logan, DO 02/27/23 2250

## 2023-02-27 NOTE — ED Notes (Signed)
Patient becoming increasingly agitated due to wait time. This tech explained to patient that he is still waiting for a room due to large number of patients. Patient states "I need my fucking medicine. I am tired of sitting here getting sicker when I just need my fucking medicine."

## 2023-02-28 ENCOUNTER — Other Ambulatory Visit: Payer: Self-pay

## 2023-04-23 NOTE — Progress Notes (Deleted)
Established Patient Office Visit  Subjective   Patient ID: [name], male    DOB: 1976-08-07  Age: 47 y.o. MRN: [id number]  No chief complaint on file.    03/2022 [name] is a 47 year old male who presents today to follow up on type 2 diabetes. He has history of paroxysmal a-fib, hyperlipidemia and tobacco use. He was previously seen in November 2022.    He is currently using Basaglar 20-25 units daily and 6 units of humalog twice a day (only if sugars are above 150). He has been compliant with his medications and takes his blood sugars at home. Reports they range from 100-150. He saw [name] in 10/2021. His most recent A1C=12.7 (08/17/21). Today's A1C= 10.6. He has been actively trying to live a healthier lifestyle and cooks at home much more now.   At our previous visit, he was in the process of obtaining an orange card. Unfortunately he has yet to do so. He was recommended dental and opthalmology evaluations but was unable to schedule due to financials. He is interested in applying for the orange card today.   He is no longer smoking cigarettes. Denies alcohol use.  At the previous visit, TDAP, pneumonia and flu vaccine were offered, he declined. Declines them again today as well.   04/25/23    Admit date: 01/05/2023 Discharge date: 01/09/2023   Admitted From: Home   Discharge disposition: Home   Recommendations for Outpatient Follow-Up:    Follow up with your primary care provider in one week.   Check CBC, BMP, magnesium in the next visit   Discharge Diagnosis:   Principal Problem:   C. difficile diarrhea Active Problems:   Diabetic neuropathy   Type 2 diabetes mellitus with hyperglycemia, with long-term current use of insulin   History of atrial fibrillation   Discharge Condition: Improved.   Diet recommendation: Low sodium, heart healthy.  Carbohydrate-modified.     Wound care: None.   Code status: Full.     History of Present Illness:   Patient is a  47 year old male with past medical history of diabetes mellitus type 2, atrial fibrillation, CKD stage III, hyperlipidemia, anemia of chronic disease presented to the hospital with nausea, vomiting diarrhea. In the ER, patient was found to be in A-fib with RVR and was on diltiazem drip.  Patient was then admitted to the hospital for further evaluation and treatment.    Hospital Course:   Following conditions were addressed during hospitalization as listed below,   Paroxysmal Atrial fibrillation  Patient did have brief episode of RVR which was precipitated by volume depletion and was briefly on Cardizem drip.  At this time has been transitioned to oral Cardizem.  Currently sinus rhythm.  CHA2DS2-VASc score of 1.  Continue aspirin on discharge.   C. difficile diarrhea. Patient is on oral vancomycin.  Oral vancomycin will be continued for the next 8 days to complete 10-day course.  Has improved at this time.  Courage to complete the course of vancomycin.   Diabetes Mellitus type 1 w/ hyperglycemia and episodes of hypoglycemia Has improved at this time.  Patient will resume home insulin regimen on discharge.   CKD Stage 3a Likely at baseline.  Creatinine today at 1.1.   Hyperlipidemia Resume Crestor on discharge.   Anemia of chronic disease Hemoglobin of 10.2.  No mention of bleeding.   Weakness, deconditioning.  Was ambulatory and independent.   Disposition.  At this time, patient is stable for disposition home  with outpatient PCP follow-up.      Patient Active Problem List   Diagnosis Date Noted  . C. difficile diarrhea 01/09/2023  . Gastroenteritis 01/05/2023  . Atrial fibrillation with RVR (HCC) 04/12/2022  . 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 Dental caries 08/17/2021  . History of atrial fibrillation 04/24/2021  . Diabetic neuropathy (HCC) 08/27/2020  . Type 2 diabetes mellitus with hyperglycemia, with long-term current use of insulin  (HCC) 08/27/2020  . Hyperlipidemia LDL goal <70 08/27/2020  . History of smoking 04/08/2014   Past Medical History:  Diagnosis Date  . AKI (acute kidney injury) (HCC) 03/21/2018  . Diabetes mellitus    Newly diagnosed, May 2012   Past Surgical History:  Procedure Laterality Date  . APPENDECTOMY    . TONSILLECTOMY     Social History   Tobacco Use  . Smoking status: Former    Current packs/day: 0.00    Types: Cigarettes    Quit date: 10/20/2015    Years since quitting: 7.5  . Smokeless tobacco: Former    Quit date: 10/20/2015  . Tobacco comments:    Former smoker 05/05/2021  Substance Use Topics  . Alcohol use: No    Alcohol/week: 0.0 standard drinks of alcohol  . Drug use: Yes    Types: Marijuana    Comment: every 1-3 days   Family History  Problem Relation Age of Onset  . Hypertension Mother   . Diabetes Sister        Several brothers and sister with DM  . Diabetes Brother    No Known Allergies    Review of Systems  Constitutional: Negative.   HENT: Negative.    Eyes: Negative.   Respiratory: Negative.    Cardiovascular: Negative.   Gastrointestinal: Negative.   Genitourinary: Negative.   Musculoskeletal: Negative.   Skin: Negative.   Neurological: Negative.   Endo/Heme/Allergies: Negative.   Psychiatric/Behavioral: Negative.        Objective:     There were no vitals taken for this visit.    Physical Exam Constitutional:      Appearance: Normal appearance.  HENT:     Right Ear: Tympanic membrane, ear canal and external ear normal.     Left Ear: Tympanic membrane, ear canal and external ear normal.     Mouth/Throat:     Mouth: Mucous membranes are moist.     Pharynx: Oropharynx is clear.  Eyes:     Conjunctiva/sclera: Conjunctivae normal.  Cardiovascular:     Rate and Rhythm: Normal rate and regular rhythm.     Pulses: Normal pulses.     Heart sounds: Normal heart sounds.  Pulmonary:     Effort: Pulmonary effort is normal.     Breath  sounds: Normal breath sounds.  Abdominal:     General: Bowel sounds are normal.     Palpations: Abdomen is soft.  Skin:    General: Skin is warm.  Neurological:     Mental Status: He is alert and oriented to person, place, and time. Mental status is at baseline.  Psychiatric:        Mood and Affect: Mood normal.        Behavior: Behavior normal.     No results found for any visits on 04/25/23.      The 10-year ASCVD risk score (Arnett DK, et al., 2019) is: 12.2%    Assessment & Plan:   Problem List Items Addressed This Visit   None  No follow-ups on file.    Shan Levans, MD

## 2023-04-25 ENCOUNTER — Ambulatory Visit: Payer: 59 | Admitting: Critical Care Medicine

## 2023-04-28 ENCOUNTER — Ambulatory Visit: Payer: 59 | Admitting: Critical Care Medicine

## 2023-04-28 NOTE — Progress Notes (Deleted)
Established Patient Office Visit  Subjective   Patient ID: [name], male    DOB: 03/13/1976  Age: 47 y.o. MRN: [id number]  No chief complaint on file.    03/2022 [name] is a 47 year old male who presents today to follow up on type 2 diabetes. He has history of paroxysmal a-fib, hyperlipidemia and tobacco use. He was previously seen in November 2022.    He is currently using Basaglar 20-25 units daily and 6 units of humalog twice a day (only if sugars are above 150). He has been compliant with his medications and takes his blood sugars at home. Reports they range from 100-150. He saw [name] in 10/2021. His most recent A1C=12.7 (08/17/21). Today's A1C= 10.6. He has been actively trying to live a healthier lifestyle and cooks at home much more now.   At our previous visit, he was in the process of obtaining an orange card. Unfortunately he has yet to do so. He was recommended dental and opthalmology evaluations but was unable to schedule due to financials. He is interested in applying for the orange card today.   He is no longer smoking cigarettes. Denies alcohol use.  At the previous visit, TDAP, pneumonia and flu vaccine were offered, he declined. Declines them again today as well.   04/28/23 Labs meds uabfoot   Admit date: 01/05/2023 Discharge date: 01/09/2023   Admitted From: Home   Discharge disposition: Home   Recommendations for Outpatient Follow-Up:    Follow up with your primary care provider in one week.   Check CBC, BMP, magnesium in the next visit   Discharge Diagnosis:   Principal Problem:   C. difficile diarrhea Active Problems:   Diabetic neuropathy   Type 2 diabetes mellitus with hyperglycemia, with long-term current use of insulin   History of atrial fibrillation   Discharge Condition: Improved.   Diet recommendation: Low sodium, heart healthy.  Carbohydrate-modified.     Wound care: None.   Code status: Full.     History of Present Illness:    Patient is a 47 year old male with past medical history of diabetes mellitus type 2, atrial fibrillation, CKD stage III, hyperlipidemia, anemia of chronic disease presented to the hospital with nausea, vomiting diarrhea. In the ER, patient was found to be in A-fib with RVR and was on diltiazem drip.  Patient was then admitted to the hospital for further evaluation and treatment.    Hospital Course:   Following conditions were addressed during hospitalization as listed below,   Paroxysmal Atrial fibrillation  Patient did have brief episode of RVR which was precipitated by volume depletion and was briefly on Cardizem drip.  At this time has been transitioned to oral Cardizem.  Currently sinus rhythm.  CHA2DS2-VASc score of 1.  Continue aspirin on discharge.   C. difficile diarrhea. Patient is on oral vancomycin.  Oral vancomycin will be continued for the next 8 days to complete 10-day course.  Has improved at this time.  Courage to complete the course of vancomycin.   Diabetes Mellitus type 1 w/ hyperglycemia and episodes of hypoglycemia Has improved at this time.  Patient will resume home insulin regimen on discharge.   CKD Stage 3a Likely at baseline.  Creatinine today at 1.1.   Hyperlipidemia Resume Crestor on discharge.   Anemia of chronic disease Hemoglobin of 10.2.  No mention of bleeding.   Weakness, deconditioning.  Was ambulatory and independent.   Disposition.  At this time, patient is stable for  disposition home with outpatient PCP follow-up.       Patient Active Problem List   Diagnosis Date Noted   C. 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00    Types: Cigarettes    Quit date: 10/20/2015    Years since quitting: 7.5   Smokeless tobacco: Former    Quit date: 10/20/2015   Tobacco comments:    Former smoker 05/05/2021  Substance Use Topics   Alcohol use: No    Alcohol/week: 0.0 standard drinks of alcohol   Drug use: Yes    Types: Marijuana    Comment: every 1-3 days   Family History  Problem Relation Age of Onset   Hypertension Mother    Diabetes Sister        Several brothers and sister with DM   Diabetes Brother    No Known Allergies    Review of Systems  Constitutional: Negative.   HENT: Negative.    Eyes: Negative.   Respiratory: Negative.    Cardiovascular: Negative.   Gastrointestinal: Negative.   Genitourinary: Negative.   Musculoskeletal: Negative.   Skin: Negative.   Neurological: Negative.   Endo/Heme/Allergies: Negative.   Psychiatric/Behavioral: Negative.        Objective:     There were no vitals taken for this visit.    Physical Exam Constitutional:      Appearance: Normal appearance.  HENT:     Right Ear: Tympanic membrane, ear canal and external ear normal.     Left Ear: Tympanic membrane, ear canal and external ear normal.     Mouth/Throat:     Mouth: Mucous membranes are moist.     Pharynx: Oropharynx is clear.  Eyes:     Conjunctiva/sclera: Conjunctivae normal.  Cardiovascular:     Rate and Rhythm: Normal rate and regular rhythm.     Pulses: Normal pulses.     Heart sounds: Normal heart sounds.  Pulmonary:     Effort: Pulmonary effort is normal.     Breath sounds:  Normal breath sounds.  Abdominal:     General: Bowel sounds are normal.     Palpations: Abdomen is soft.  Skin:    General: Skin is warm.  Neurological:     Mental Status: He is alert and oriented to person, place, and time. Mental status is at baseline.  Psychiatric:        Mood and Affect: Mood normal.        Behavior: Behavior normal.      No results found for any visits on 04/28/23.      The 10-year ASCVD risk score (Arnett DK, et al., 2019) is: 12.2%    Assessment & Plan:   Problem List Items Addressed This Visit  None    No follow-ups on file.    Shan Levans, MD

## 2023-05-16 ENCOUNTER — Other Ambulatory Visit: Payer: Self-pay

## 2023-05-16 MED ORDER — BASAGLAR KWIKPEN 100 UNIT/ML ~~LOC~~ SOPN
24.0000 [IU] | PEN_INJECTOR | Freq: Every day | SUBCUTANEOUS | 0 refills | Status: DC
  Filled 2023-05-16 – 2023-05-17 (×2): qty 6, 25d supply, fill #0
  Filled 2023-06-21: qty 3, 12d supply, fill #1

## 2023-05-17 ENCOUNTER — Other Ambulatory Visit: Payer: Self-pay

## 2023-05-17 MED ORDER — INSULIN GLARGINE-YFGN 100 UNIT/ML ~~LOC~~ SOPN
PEN_INJECTOR | SUBCUTANEOUS | 0 refills | Status: DC
  Filled 2023-05-17: qty 6, 25d supply, fill #0

## 2023-06-21 ENCOUNTER — Other Ambulatory Visit: Payer: Self-pay

## 2023-06-21 ENCOUNTER — Other Ambulatory Visit: Payer: Self-pay | Admitting: Critical Care Medicine

## 2023-06-22 ENCOUNTER — Other Ambulatory Visit: Payer: Self-pay

## 2023-06-22 MED ORDER — BASAGLAR KWIKPEN 100 UNIT/ML ~~LOC~~ SOPN
24.0000 [IU] | PEN_INJECTOR | Freq: Every day | SUBCUTANEOUS | 0 refills | Status: DC
  Filled 2023-06-22: qty 6, 25d supply, fill #0

## 2023-06-22 NOTE — Telephone Encounter (Signed)
Requested medication (s) are due for refill today: Yes  Requested medication (s) are on the active medication list: Yes  Last refill:  05/16/23  Future visit scheduled: Yes  Notes to clinic:  Pharmacy requests new prescription.    Requested Prescriptions  Pending Prescriptions Disp Refills   Insulin Glargine (BASAGLAR KWIKPEN) 100 UNIT/ML 9 mL 0    Sig: Inject 24 Units into the skin daily.     Endocrinology:  Diabetes - Insulins Failed - 06/21/2023  4:04 PM      Failed - HBA1C is between 0 and 7.9 and within 180 days    HbA1c, POC (prediabetic range)  Date Value Ref Range Status  07/12/2018 12.2 (A) 5.7 - 6.4 % Final   HbA1c, POC (controlled diabetic range)  Date Value Ref Range Status  04/12/2022 10.6 (A) 0.0 - 7.0 % Final   Hgb A1c MFr Bld  Date Value Ref Range Status  01/06/2023 11.3 (H) 4.8 - 5.6 % Final    Comment:    (NOTE) Pre diabetes:          5.7%-6.4%  Diabetes:              >6.4%  Glycemic control for   <7.0% adults with diabetes          Failed - Valid encounter within last 6 months    Recent Outpatient Visits           1 year ago Type 2 diabetes mellitus with hyperglycemia, with long-term current use of insulin [facility])   [place] [facility] Storm Frisk, MD   1 year ago Type 2 diabetes mellitus with hyperglycemia, with long-term current use of insulin [facility])   [place] [facility] [place], [place] L, RPH-CPP   1 year ago Type 2 diabetes mellitus with hyperglycemia, with long-term current use of insulin [facility])   [place] [facility] [place], [place] L, RPH-CPP   1 year ago Type 2 diabetes mellitus with hyperglycemia, with long-term current use of insulin [facility])   [place] [facility] Storm Frisk, MD   2 years ago Type 2 diabetes mellitus with hyperglycemia, with long-term current use of insulin [facility])   [place] [facility] [place], Marzella Schlein, [place]       Future Appointments             In 1 month Claiborne Rigg, NP [facility]

## 2023-06-23 ENCOUNTER — Other Ambulatory Visit: Payer: Self-pay

## 2023-07-25 ENCOUNTER — Telehealth: Payer: Self-pay | Admitting: Critical Care Medicine

## 2023-07-25 ENCOUNTER — Other Ambulatory Visit: Payer: Self-pay | Admitting: Pharmacist

## 2023-07-25 ENCOUNTER — Other Ambulatory Visit: Payer: Self-pay

## 2023-07-25 ENCOUNTER — Other Ambulatory Visit: Payer: Self-pay | Admitting: Critical Care Medicine

## 2023-07-25 MED ORDER — BASAGLAR KWIKPEN 100 UNIT/ML ~~LOC~~ SOPN
24.0000 [IU] | PEN_INJECTOR | Freq: Every day | SUBCUTANEOUS | 0 refills | Status: DC
  Filled 2023-07-25: qty 3, 12d supply, fill #0

## 2023-07-25 NOTE — Telephone Encounter (Signed)
Already requested by pharmacy today in a separate refill encounter, routed to the office for approval.

## 2023-07-25 NOTE — Telephone Encounter (Signed)
Medication Refill - Medication: Insulin Glargine (BASAGLAR KWIKPEN) 100 UNIT/ML   Pt stated he is out of his medication as of today. Please advise.   Has the patient contacted their pharmacy? Yes.    (Agent: If yes, when and what did the pharmacy advise?)  Preferred Pharmacy (with phone number or street name):  [facility] - [facility]  [address] [place] [address]  Phone: [phone] Fax: [phone]  Hours: M-F 7:30a-6:00p   Has the patient been seen for an appointment in the last year OR does the patient have an upcoming appointment? Yes.    Agent: Please be advised that RX refills may take up to 3 business days. We ask that you follow-up with your pharmacy.

## 2023-07-25 NOTE — Telephone Encounter (Signed)
Requested medication (s) are due for refill today: Yes  Requested medication (s) are on the active medication list: Yes  Last refill:  06/22/23  Future visit scheduled: Yes  Notes to clinic:  Unable to refill per protocol, appointment needed. Patient is out of medication, requesting refill.     Requested Prescriptions  Pending Prescriptions Disp Refills   Insulin Glargine (BASAGLAR KWIKPEN) 100 UNIT/ML 6 mL 0    Sig: Inject 24 Units into the skin daily.     Endocrinology:  Diabetes - Insulins Failed - 07/25/2023 10:30 AM      Failed - HBA1C is between 0 and 7.9 and within 180 days    HbA1c, POC (prediabetic range)  Date Value Ref Range Status  07/12/2018 12.2 (A) 5.7 - 6.4 % Final   HbA1c, POC (controlled diabetic range)  Date Value Ref Range Status  04/12/2022 10.6 (A) 0.0 - 7.0 % Final   Hgb A1c MFr Bld  Date Value Ref Range Status  01/06/2023 11.3 (H) 4.8 - 5.6 % Final    Comment:    (NOTE) Pre diabetes:          5.7%-6.4%  Diabetes:              >6.4%  Glycemic control for   <7.0% adults with diabetes          Failed - Valid encounter within last 6 months    Recent Outpatient Visits           1 year ago Type 2 diabetes mellitus with hyperglycemia, with long-term current use of insulin (HCC)   [facility] Dept Of [place]. [facility] Storm Frisk, MD   1 year ago Type 2 diabetes mellitus with hyperglycemia, with long-term current use of insulin [facility])   [facility] - A Dept Of [place]. [facility] Lois Huxley, [place] L, RPH-CPP   1 year ago Type 2 diabetes mellitus with hyperglycemia, with long-term current use of insulin (HCC)   [facility] - A Dept Of [place]. [facility] Lois Huxley, [place] L, RPH-CPP   1 year ago Type 2 diabetes mellitus with hyperglycemia, with long-term current use of insulin (HCC)   [facility] - A Dept Of [place].  [facility] Storm Frisk, MD   2 years ago Type 2 diabetes mellitus with hyperglycemia, with long-term current use of insulin [facility])   [facility] - A Dept Of [place]. [facility] [place], Marzella Schlein, [place]       Future Appointments             In 6 days Claiborne Rigg, NP [facility] - A Dept Of . [facility]

## 2023-07-26 ENCOUNTER — Other Ambulatory Visit: Payer: Self-pay

## 2023-07-31 ENCOUNTER — Other Ambulatory Visit: Payer: Self-pay

## 2023-07-31 ENCOUNTER — Encounter: Payer: Self-pay | Admitting: Nurse Practitioner

## 2023-07-31 ENCOUNTER — Ambulatory Visit: Payer: 59 | Attending: Nurse Practitioner | Admitting: Nurse Practitioner

## 2023-07-31 VITALS — BP 94/62 | HR 86 | Ht 72.0 in | Wt 130.4 lb

## 2023-07-31 DIAGNOSIS — Z794 Long term (current) use of insulin: Secondary | ICD-10-CM | POA: Diagnosis not present

## 2023-07-31 DIAGNOSIS — E1165 Type 2 diabetes mellitus with hyperglycemia: Secondary | ICD-10-CM

## 2023-07-31 DIAGNOSIS — Z1211 Encounter for screening for malignant neoplasm of colon: Secondary | ICD-10-CM | POA: Diagnosis not present

## 2023-07-31 DIAGNOSIS — E785 Hyperlipidemia, unspecified: Secondary | ICD-10-CM | POA: Diagnosis not present

## 2023-07-31 DIAGNOSIS — D649 Anemia, unspecified: Secondary | ICD-10-CM | POA: Diagnosis not present

## 2023-07-31 LAB — POCT GLYCOSYLATED HEMOGLOBIN (HGB A1C): Hemoglobin A1C: 12.3 % — AB (ref 4.0–5.6)

## 2023-07-31 LAB — GLUCOSE, POCT (MANUAL RESULT ENTRY): POC Glucose: 162 mg/dL — AB (ref 70–99)

## 2023-07-31 MED ORDER — BASAGLAR KWIKPEN 100 UNIT/ML ~~LOC~~ SOPN
20.0000 [IU] | PEN_INJECTOR | Freq: Two times a day (BID) | SUBCUTANEOUS | 1 refills | Status: DC
Start: 2023-07-31 — End: 2023-08-12
  Filled 2023-07-31: qty 15, 38d supply, fill #0
  Filled 2023-07-31: qty 12, 30d supply, fill #0
  Filled 2023-08-01: qty 15, 38d supply, fill #0
  Filled 2023-08-11 (×2): qty 12, 30d supply, fill #0
  Filled 2023-08-11: qty 15, 30d supply, fill #0
  Filled 2023-08-11 (×3): qty 12, 30d supply, fill #0

## 2023-07-31 MED ORDER — NOVOLOG FLEXPEN 100 UNIT/ML ~~LOC~~ SOPN
6.0000 [IU] | PEN_INJECTOR | Freq: Two times a day (BID) | SUBCUTANEOUS | 0 refills | Status: DC
Start: 2023-07-31 — End: 2023-08-13
  Filled 2023-07-31: qty 3, 25d supply, fill #0

## 2023-07-31 MED ORDER — ROSUVASTATIN CALCIUM 20 MG PO TABS
20.0000 mg | ORAL_TABLET | Freq: Every day | ORAL | 1 refills | Status: AC
Start: 2023-07-31 — End: ?
  Filled 2023-07-31: qty 30, 30d supply, fill #0

## 2023-07-31 NOTE — Progress Notes (Signed)
Assessment & Plan:  [name] was seen today for medical management of chronic issues.  Diagnoses and all orders for this visit:  Type 2 diabetes mellitus with hyperglycemia, with long-term current use of insulin  Increase dose of basaglar. -     POCT glycosylated hemoglobin (Hb A1C) -     Ambulatory referral to Ophthalmology -     Urine Albumin/Creatinine with ratio (send out) [LAB689] -     POCT glucose (manual entry) -     Insulin Glargine (BASAGLAR KWIKPEN) 100 UNIT/ML; Inject 20 Units into the skin 2 (two) times daily. -     insulin aspart (NOVOLOG FLEXPEN) 100 UNIT/ML FlexPen; Inject 6 Units into the skin 2 (two) times daily. -     CMP14+EGFR Continue blood sugar control as discussed in office today, low carbohydrate diet, and regular physical exercise as tolerated, 150 minutes per week (30 min each day, 5 days per week, or 50 min 3 days per week). Keep blood sugar logs with fasting goal of 90-130 mg/dl, post prandial (after you eat) less than 180.  For Hypoglycemia: BS <60 and Hyperglycemia BS >400; contact the clinic ASAP. Annual eye exams and foot exams are recommended.   Colon cancer screening -     Ambulatory referral to Gastroenterology  Hyperlipidemia LDL goal <70 -     rosuvastatin (CRESTOR) 20 MG tablet; Take 1 tablet (20 mg total) by mouth daily. To lower cholesterol -     Lipid panel INSTRUCTIONS: Work on a low fat, heart healthy diet and participate in regular aerobic exercise program by working out at least 150 minutes per week; 5 days a week-30 minutes per day. Avoid red meat/beef/steak,  fried foods. junk foods, sodas, sugary drinks, unhealthy snacking, alcohol and smoking.  Drink at least 80 oz of water per day and monitor your carbohydrate intake daily.    Anemia, unspecified type -     CBC with Differential    Patient has been counseled on age-appropriate routine health concerns for screening and prevention. These are reviewed and up-to-date. Referrals have  been placed accordingly. Immunizations are up-to-date or declined.    Subjective:   Chief Complaint  Patient presents with   Medical Management of Chronic Issues    [name] 47 y.o. male presents to office today requesting medication refills for his diabetes. He his a patient of Dr. Delford Field and has not been seen since last year.   He has a past medical history of AKI (03/21/2018), PAF, HPL, Tobacco dependence,  and DM   DM 2 Not taking insulins as prescribed. Could not tolerate metformin in the past . Currently prescribed novolog 6 units BID and basaglar 24 units daily.  Lab Results  Component Value Date   HGBA1C 12.3 (A) 07/31/2023  LDL not at goal. He did not pick up rosuvastatin from pharmacy  Lab Results  Component Value Date   LDLCALC 110 (H) 05/14/2021     Review of Systems  Constitutional:  Negative for fever, malaise/fatigue and weight loss.  HENT: Negative.  Negative for nosebleeds.   Eyes: Negative.  Negative for blurred vision, double vision and photophobia.  Respiratory: Negative.  Negative for cough and shortness of breath.   Cardiovascular: Negative.  Negative for chest pain, palpitations and leg swelling.  Gastrointestinal: Negative.  Negative for heartburn, nausea and vomiting.  Musculoskeletal: Negative.  Negative for myalgias.  Neurological: Negative.  Negative for dizziness, focal weakness, seizures and headaches.  Psychiatric/Behavioral: Negative.  Negative for suicidal ideas.  Past Medical History:  Diagnosis Date   AKI (acute kidney injury) (HCC) 03/21/2018   Diabetes mellitus    Newly diagnosed, May 2012    Past Surgical History:  Procedure Laterality Date   APPENDECTOMY     TONSILLECTOMY      Family History  Problem Relation Age of Onset   Hypertension Mother    Diabetes Sister        Several brothers and sister with DM   Diabetes Brother     Social History Reviewed with no changes to be made today.   Outpatient Medications  Prior to Visit  Medication Sig Dispense Refill   diltiazem (CARDIZEM CD) 120 MG 24 hr capsule Take 1 capsule (120 mg total) by mouth daily. 30 capsule 2   Insulin Pen Needle (TRUEPLUS 5-BEVEL PEN NEEDLES) 31G X 5 MM MISC Use to inject insulin 2 (two) times daily. 100 each 3   insulin aspart (NOVOLOG FLEXPEN) 100 UNIT/ML FlexPen Inject 6 Units into the skin 2 (two) times daily. 15 mL 0   Insulin Glargine (BASAGLAR KWIKPEN) 100 UNIT/ML Inject 24 Units into the skin daily. 3 mL 0   aspirin EC 81 MG tablet Take 1 tablet (81 mg total) by mouth daily. Swallow whole. (Patient not taking: Reported on 07/31/2023) 30 tablet 12   ondansetron (ZOFRAN) 4 MG tablet Take 1 tablet (4 mg total) by mouth daily as needed for nausea or vomiting. (Patient not taking: Reported on 07/31/2023) 30 tablet 1   rosuvastatin (CRESTOR) 20 MG tablet Take 1 tablet (20 mg total) by mouth daily. To lower cholesterol (Patient not taking: Reported on 01/05/2023) 60 tablet 4   No facility-administered medications prior to visit.    No Known Allergies     Objective:    BP 94/62 (BP Location: Left Arm, Patient Position: Sitting, Cuff Size: Normal)   Pulse 86   Ht 6' (1.829 m)   Wt 130 lb 6.4 oz (59.1 kg)   SpO2 100%   BMI 17.69 kg/m  Wt Readings from Last 3 Encounters:  07/31/23 130 lb 6.4 oz (59.1 kg)  01/05/23 135 lb (61.2 kg)  04/12/22 132 lb (59.9 kg)    Physical Exam Vitals and nursing note reviewed.  Constitutional:      Appearance: He is well-developed.  HENT:     Head: Normocephalic and atraumatic.  Cardiovascular:     Rate and Rhythm: Normal rate and regular rhythm.     Heart sounds: Normal heart sounds. No murmur heard.    No friction rub. No gallop.  Pulmonary:     Effort: Pulmonary effort is normal. No tachypnea or respiratory distress.     Breath sounds: Normal breath sounds. No decreased breath sounds, wheezing, rhonchi or rales.  Chest:     Chest wall: No tenderness.  Abdominal:     General:  Bowel sounds are normal.     Palpations: Abdomen is soft.  Musculoskeletal:        General: Normal range of motion.     Cervical back: Normal range of motion.  Skin:    General: Skin is warm and dry.  Neurological:     Mental Status: He is alert and oriented to person, place, and time.     Coordination: Coordination normal.  Psychiatric:        Behavior: Behavior normal. Behavior is cooperative.        Thought Content: Thought content normal.        Judgment: Judgment normal.  Patient has been counseled extensively about nutrition and exercise as well as the importance of adherence with medications and regular follow-up. The patient was given clear instructions to go to ER or return to medical center if symptoms don't improve, worsen or new problems develop. The patient verbalized understanding.   Follow-up: Return in about 3 months (around 10/31/2023).   Claiborne Rigg, FNP-BC [facility] [place], [place] [phone]   07/31/2023, 7:56 PM

## 2023-08-01 ENCOUNTER — Other Ambulatory Visit: Payer: Self-pay

## 2023-08-01 LAB — CBC WITH DIFFERENTIAL/PLATELET
Basophils Absolute: 0 10*3/uL (ref 0.0–0.2)
Basos: 1 %
EOS (ABSOLUTE): 0.2 10*3/uL (ref 0.0–0.4)
Eos: 2 %
Hematocrit: 34.6 % — ABNORMAL LOW (ref 37.5–51.0)
Hemoglobin: 10.5 g/dL — ABNORMAL LOW (ref 13.0–17.7)
Immature Grans (Abs): 0 10*3/uL (ref 0.0–0.1)
Immature Granulocytes: 0 %
Lymphocytes Absolute: 1.8 10*3/uL (ref 0.7–3.1)
Lymphs: 25 %
MCH: 25.1 pg — ABNORMAL LOW (ref 26.6–33.0)
MCHC: 30.3 g/dL — ABNORMAL LOW (ref 31.5–35.7)
MCV: 83 fL (ref 79–97)
Monocytes Absolute: 0.5 10*3/uL (ref 0.1–0.9)
Monocytes: 7 %
Neutrophils Absolute: 4.6 10*3/uL (ref 1.4–7.0)
Neutrophils: 65 %
Platelets: 264 10*3/uL (ref 150–450)
RBC: 4.18 x10E6/uL (ref 4.14–5.80)
RDW: 14.4 % (ref 11.6–15.4)
WBC: 7.1 10*3/uL (ref 3.4–10.8)

## 2023-08-01 LAB — CMP14+EGFR
ALT: 38 [IU]/L (ref 0–44)
AST: 19 [IU]/L (ref 0–40)
Albumin: 3.8 g/dL — ABNORMAL LOW (ref 4.1–5.1)
Alkaline Phosphatase: 129 [IU]/L — ABNORMAL HIGH (ref 44–121)
BUN/Creatinine Ratio: 8 — ABNORMAL LOW (ref 9–20)
BUN: 15 mg/dL (ref 6–24)
Bilirubin Total: 0.2 mg/dL (ref 0.0–1.2)
CO2: 21 mmol/L (ref 20–29)
Calcium: 9.3 mg/dL (ref 8.7–10.2)
Chloride: 108 mmol/L — ABNORMAL HIGH (ref 96–106)
Creatinine, Ser: 1.79 mg/dL — ABNORMAL HIGH (ref 0.76–1.27)
Globulin, Total: 2.5 g/dL (ref 1.5–4.5)
Glucose: 197 mg/dL — ABNORMAL HIGH (ref 70–99)
Potassium: 4 mmol/L (ref 3.5–5.2)
Sodium: 144 mmol/L (ref 134–144)
Total Protein: 6.3 g/dL (ref 6.0–8.5)
eGFR: 47 mL/min/{1.73_m2} — ABNORMAL LOW (ref >59)

## 2023-08-01 LAB — MICROALBUMIN / CREATININE URINE RATIO
Creatinine, Urine: 457.6 mg/dL
Microalb/Creat Ratio: 109 mg/g{creat} — ABNORMAL HIGH (ref 0–29)
Microalbumin, Urine: 499.4 ug/mL

## 2023-08-01 LAB — LIPID PANEL
Chol/HDL Ratio: 3.4 ratio (ref 0.0–5.0)
Cholesterol, Total: 169 mg/dL (ref 100–199)
HDL: 50 mg/dL (ref >39)
LDL Chol Calc (NIH): 99 mg/dL (ref 0–99)
Triglycerides: 112 mg/dL (ref 0–149)
VLDL Cholesterol Cal: 20 mg/dL (ref 5–40)

## 2023-08-02 ENCOUNTER — Other Ambulatory Visit: Payer: Self-pay | Admitting: Nurse Practitioner

## 2023-08-02 DIAGNOSIS — N1831 Chronic kidney disease, stage 3a: Secondary | ICD-10-CM

## 2023-08-03 ENCOUNTER — Telehealth: Payer: Self-pay

## 2023-08-03 NOTE — Telephone Encounter (Signed)
-----   Message from Claiborne Rigg sent at 08/02/2023  9:08 AM EST ----- Elevated liver enzymes.  Focus on low-fat low-cholesterol high-protein diet.  Avoid any alcohol.  Kidney levels are elevated.  Referral sent to kidney specialist.  Needs better control of diabetes which will help kidneys.  CBC shows anemia.  Needs to follow-up with gastroenterology for colonoscopy to determine source of anemia.  Cholesterol levels improved.  Continue cholesterol medications as prescribed  Urine shows microscopic diabetic kidney changes.

## 2023-08-03 NOTE — Telephone Encounter (Signed)
Pt was called and is aware of results, DOB was confirmed.  ?

## 2023-08-04 ENCOUNTER — Other Ambulatory Visit: Payer: Self-pay

## 2023-08-04 DIAGNOSIS — Z008 Encounter for other general examination: Secondary | ICD-10-CM | POA: Diagnosis not present

## 2023-08-11 ENCOUNTER — Other Ambulatory Visit: Payer: Self-pay | Admitting: Critical Care Medicine

## 2023-08-11 ENCOUNTER — Other Ambulatory Visit: Payer: Self-pay

## 2023-08-11 DIAGNOSIS — E1165 Type 2 diabetes mellitus with hyperglycemia: Secondary | ICD-10-CM

## 2023-08-11 NOTE — Telephone Encounter (Signed)
Medication Refill -  Most Recent Primary Care Visit:  Provider: Bertram Denver W  Department: [facility]  Visit Type: OFFICE VISIT  Date: 07/31/2023  Medication: Insulin Glargine (BASAGLAR KWIKPEN) 100 UNIT/ML [[id number]] insulin aspart (NOVOLOG FLEXPEN) 100 UNIT/ML FlexPen [[id number]]   Has the patient contacted their pharmacy? Yes  (Agent: If yes, when and what did the pharmacy advise?) Contact PCP   Is this the correct pharmacy for this prescription? Yes If no, delete pharmacy and type the correct one.  This is the patient's preferred pharmacy:    [facility] [address] [place] [address] Phone: [phone] Fax: [phone]      Has the prescription been filled recently? Yes  Is the patient out of the medication? Yes  Has the patient been seen for an appointment in the last year OR does the patient have an upcoming appointment? Yes  Can we respond through MyChart? No  Agent: Please be advised that Rx refills may take up to 3 business days. We ask that you follow-up with your pharmacy.  Pt is requesting a call back if there is any reason the script can't be filled.

## 2023-08-12 ENCOUNTER — Other Ambulatory Visit (HOSPITAL_COMMUNITY): Payer: Self-pay

## 2023-08-12 ENCOUNTER — Other Ambulatory Visit: Payer: Self-pay | Admitting: Nurse Practitioner

## 2023-08-12 DIAGNOSIS — E1165 Type 2 diabetes mellitus with hyperglycemia: Secondary | ICD-10-CM

## 2023-08-12 MED ORDER — BASAGLAR KWIKPEN 100 UNIT/ML ~~LOC~~ SOPN: 20.0000 [IU] | PEN_INJECTOR | Freq: Two times a day (BID) | SUBCUTANEOUS | 1 refills | Status: DC

## 2023-08-13 ENCOUNTER — Other Ambulatory Visit: Payer: Self-pay

## 2023-08-13 ENCOUNTER — Ambulatory Visit (HOSPITAL_COMMUNITY): Admission: EM | Admit: 2023-08-13 | Discharge: 2023-08-13 | Discharge status: Against Medical Advice | Payer: 59

## 2023-08-13 ENCOUNTER — Emergency Department (HOSPITAL_COMMUNITY)
Admission: EM | Admit: 2023-08-13 | Discharge: 2023-08-13 | Disposition: A | Discharge status: Home / Self Care | Payer: 59 | Attending: Emergency Medicine | Admitting: Emergency Medicine

## 2023-08-13 ENCOUNTER — Encounter (HOSPITAL_COMMUNITY): Payer: Self-pay | Admitting: *Deleted

## 2023-08-13 ENCOUNTER — Emergency Department (HOSPITAL_COMMUNITY): Payer: 59

## 2023-08-13 DIAGNOSIS — R0789 Other chest pain: Secondary | ICD-10-CM | POA: Insufficient documentation

## 2023-08-13 DIAGNOSIS — R079 Chest pain, unspecified: Secondary | ICD-10-CM | POA: Diagnosis not present

## 2023-08-13 DIAGNOSIS — E1165 Type 2 diabetes mellitus with hyperglycemia: Secondary | ICD-10-CM | POA: Insufficient documentation

## 2023-08-13 DIAGNOSIS — R42 Dizziness and giddiness: Secondary | ICD-10-CM | POA: Diagnosis not present

## 2023-08-13 DIAGNOSIS — Z794 Long term (current) use of insulin: Secondary | ICD-10-CM | POA: Insufficient documentation

## 2023-08-13 DIAGNOSIS — R739 Hyperglycemia, unspecified: Secondary | ICD-10-CM

## 2023-08-13 LAB — CBC
HCT: 32 % — ABNORMAL LOW (ref 39.0–52.0)
Hemoglobin: 10 g/dL — ABNORMAL LOW (ref 13.0–17.0)
MCH: 25.1 pg — ABNORMAL LOW (ref 26.0–34.0)
MCHC: 31.3 g/dL (ref 30.0–36.0)
MCV: 80.4 fL (ref 80.0–100.0)
Platelets: 242 10*3/uL (ref 150–400)
RBC: 3.98 MIL/uL — ABNORMAL LOW (ref 4.22–5.81)
RDW: 14.5 % (ref 11.5–15.5)
WBC: 7.9 10*3/uL (ref 4.0–10.5)
nRBC: 0 % (ref 0.0–0.2)

## 2023-08-13 LAB — CBG MONITORING, ED
Glucose-Capillary: 352 mg/dL — ABNORMAL HIGH (ref 70–99)
Glucose-Capillary: 373 mg/dL — ABNORMAL HIGH (ref 70–99)
Glucose-Capillary: 331 mg/dL — ABNORMAL HIGH (ref 70–99)
Glucose-Capillary: 269 mg/dL — ABNORMAL HIGH (ref 70–99)

## 2023-08-13 LAB — BASIC METABOLIC PANEL
Anion gap: 6 (ref 5–15)
BUN: 20 mg/dL (ref 6–20)
CO2: 24 mmol/L (ref 22–32)
Calcium: 9 mg/dL (ref 8.9–10.3)
Chloride: 105 mmol/L (ref 98–111)
Creatinine, Ser: 1.64 mg/dL — ABNORMAL HIGH (ref 0.61–1.24)
GFR, Estimated: 52 mL/min — ABNORMAL LOW (ref >60)
Glucose, Bld: 374 mg/dL — ABNORMAL HIGH (ref 70–99)
Potassium: 4.2 mmol/L (ref 3.5–5.1)
Sodium: 135 mmol/L (ref 135–145)

## 2023-08-13 LAB — TROPONIN I (HIGH SENSITIVITY)
Troponin I (High Sensitivity): 8 ng/L (ref <18)
Troponin I (High Sensitivity): 7 ng/L (ref <18)

## 2023-08-13 MED ORDER — NOVOLOG FLEXPEN 100 UNIT/ML ~~LOC~~ SOPN
6.0000 [IU] | PEN_INJECTOR | Freq: Two times a day (BID) | SUBCUTANEOUS | 0 refills | Status: DC
  Filled 2023-08-13: qty 3, 25d supply, fill #0

## 2023-08-13 MED ORDER — BASAGLAR KWIKPEN 100 UNIT/ML ~~LOC~~ SOPN
20.0000 [IU] | PEN_INJECTOR | Freq: Every day | SUBCUTANEOUS | 1 refills | Status: DC
  Filled 2023-08-13: qty 6, 30d supply, fill #0
  Filled 2023-09-11: qty 6, 30d supply, fill #1
  Filled 2023-10-18: qty 6, 30d supply, fill #2
  Filled 2023-11-21 (×2): qty 6, 30d supply, fill #3

## 2023-08-13 MED ORDER — LACTATED RINGERS IV BOLUS
500.0000 mL | Freq: Once | INTRAVENOUS | Status: AC
  Administered 2023-08-13: 500 mL via INTRAVENOUS

## 2023-08-13 MED ORDER — INSULIN ASPART 100 UNIT/ML IJ SOLN
0.0000 [IU] | INTRAMUSCULAR | Status: DC
  Administered 2023-08-13: 11 [IU] via SUBCUTANEOUS

## 2023-08-13 MED ORDER — INSULIN GLARGINE-YFGN 100 UNIT/ML ~~LOC~~ SOLN
24.0000 [IU] | Freq: Once | SUBCUTANEOUS | Status: AC
  Administered 2023-08-13: 24 [IU] via SUBCUTANEOUS
  Filled 2023-08-13: qty 0.24

## 2023-08-13 NOTE — ED Notes (Signed)
Pt verbalized understanding of discharge instructions and have been given opportunities to ask question, Pt educated on making sure he picks up his insulin and to take his insulin as prescribed.

## 2023-08-13 NOTE — Discharge Instructions (Addendum)
Your lab work today was reassuring other than high blood sugar.  You should continue taking your insulin and follow close with your doctor.  If you develop worsening chest pain, difficulty breathing or any other new concerning symptoms you should return to the ED.

## 2023-08-13 NOTE — ED Provider Notes (Signed)
[place] EMERGENCY DEPARTMENT AT [facility] Provider Note   CSN: [id number] Arrival date & time: 08/13/23  1732     History  Chief Complaint  Patient presents with   Chest Pain    [name] is a 47 y.o. male.   Chest Pain 35 old male history of type 2 diabetes, hyperlipidemia, atrial fibrillation presenting for chest pain and hyperglycemia.  Patient states he ran out of his insulin on Friday has not taken it since.  He takes 20 units long-acting daily and 6 units twice daily with meals for his blood sugar.  He has not any polyuria or polydipsia but has felt somewhat dizzy.  Eating and drinking okay.  No vomiting.  He is also noted some left-sided chest pain.  It is worse with movement and somewhat positional.  Not exertional.  No pleuritic pain.  No radiation to his back or arm.  No headache or neurologic symptoms.  He smokes marijuana but does not smoke otherwise.     Home Medications Prior to Admission medications   Medication Sig Start Date End Date Taking? Authorizing Provider  rosuvastatin (CRESTOR) 20 MG tablet Take 1 tablet (20 mg total) by mouth daily. To lower cholesterol 07/31/23  Yes Claiborne Rigg, NP  diltiazem (CARDIZEM CD) 120 MG 24 hr capsule Take 1 capsule (120 mg total) by mouth daily. 01/09/23 07/31/23  Pokhrel, Rebekah [place], MD  insulin aspart (NOVOLOG FLEXPEN) 100 UNIT/ML FlexPen Inject 6 Units into the skin 2 (two) times daily. 08/13/23   Laurence Spates, MD  Insulin Glargine [facility]) 100 UNIT/ML Inject 20 Units into the skin daily. 08/13/23   Laurence Spates, MD  Insulin Pen Needle (TRUEPLUS 5-BEVEL PEN NEEDLES) 31G X 5 MM MISC Use to inject insulin 2 (two) times daily. 02/27/23   Horton, Clabe Seal, DO      Allergies    Patient has no known allergies.    Review of Systems   Review of Systems  Cardiovascular:  Positive for chest pain.  Review of systems completed and notable as per HPI.  ROS otherwise negative.   Physical  Exam Updated Vital Signs BP 131/89   Pulse 81   Temp 98.2 F (36.8 C) (Oral)   Resp 19   Ht 6' (1.829 m)   Wt 59 kg   SpO2 100%   BMI 17.63 kg/m  Physical Exam Vitals and nursing note reviewed.  Constitutional:      General: He is not in acute distress.    Appearance: He is well-developed.  HENT:     Head: Normocephalic and atraumatic.     Nose: Nose normal.     Mouth/Throat:     Mouth: Mucous membranes are moist.     Pharynx: Oropharynx is clear.  Eyes:     Extraocular Movements: Extraocular movements intact.     Conjunctiva/sclera: Conjunctivae normal.     Pupils: Pupils are equal, round, and reactive to light.  Cardiovascular:     Rate and Rhythm: Normal rate and regular rhythm.     Pulses: Normal pulses.     Heart sounds: Normal heart sounds. No murmur heard. Pulmonary:     Effort: Pulmonary effort is normal. No respiratory distress.     Breath sounds: Normal breath sounds.  Abdominal:     Palpations: Abdomen is soft.     Tenderness: There is no abdominal tenderness. There is no guarding or rebound.  Musculoskeletal:        General: No swelling.  Cervical back: Neck supple.     Right lower leg: No edema.     Left lower leg: No edema.  Skin:    General: Skin is warm and dry.     Capillary Refill: Capillary refill takes less than 2 seconds.  Neurological:     General: No focal deficit present.     Mental Status: He is alert and oriented to person, place, and time. Mental status is at baseline.     Cranial Nerves: No cranial nerve deficit.     Sensory: No sensory deficit.     Motor: No weakness.  Psychiatric:        Mood and Affect: Mood normal.     ED Results / Procedures / Treatments   Labs (all labs ordered are listed, but only abnormal results are displayed) Labs Reviewed  BASIC METABOLIC PANEL - Abnormal; Notable for the following components:      Result Value   Glucose, Bld 374 (*)    Creatinine, Ser 1.64 (*)    GFR, Estimated 52 (*)    All  other components within normal limits  CBC - Abnormal; Notable for the following components:   RBC 3.98 (*)    Hemoglobin 10.0 (*)    HCT 32.0 (*)    MCH 25.1 (*)    All other components within normal limits  CBG MONITORING, ED - Abnormal; Notable for the following components:   Glucose-Capillary 352 (*)    All other components within normal limits  CBG MONITORING, ED - Abnormal; Notable for the following components:   Glucose-Capillary 373 (*)    All other components within normal limits  CBG MONITORING, ED - Abnormal; Notable for the following components:   Glucose-Capillary 331 (*)    All other components within normal limits  CBG MONITORING, ED - Abnormal; Notable for the following components:   Glucose-Capillary 269 (*)    All other components within normal limits  TROPONIN I (HIGH SENSITIVITY)  TROPONIN I (HIGH SENSITIVITY)    EKG EKG Interpretation Date/Time:  Sunday August 13 2023 18:17:46 EST Ventricular Rate:  79 PR Interval:  162 QRS Duration:  88 QT Interval:  396 QTC Calculation: 454 R Axis:   128  Text Interpretation: Normal sinus rhythm Right axis deviation Pulmonary disease pattern ST elevations similar to multiple priors Confirmed by Pricilla Loveless [phone]) on 08/13/2023 6:20:58 PM  Radiology DG Chest 2 View  Result Date: 08/13/2023 CLINICAL DATA:  Chest pain and dizziness EXAM: CHEST - 2 VIEW COMPARISON:  Chest x-ray 01/05/2023 FINDINGS: The heart size and mediastinal contours are within normal limits. Both lungs are clear. The visualized skeletal structures are unremarkable. IMPRESSION: No active cardiopulmonary disease. Electronically Signed   By: Darliss Cheney M.D.   On: 08/13/2023 20:16    Procedures Procedures    Medications Ordered in ED Medications  insulin aspart (novoLOG) injection 0-15 Units (11 Units Subcutaneous Given 08/13/23 2213)  lactated ringers bolus 500 mL (0 mLs Intravenous Stopped 08/13/23 2049)  insulin glargine-yfgn (SEMGLEE)  injection 24 Units (24 Units Subcutaneous Given 08/13/23 2056)    ED Course/ Medical Decision Making/ A&P                                 Medical Decision Making Amount and/or Complexity of Data Reviewed Labs: ordered. Radiology: ordered.  Risk Prescription drug management.   Medical Decision Making:   [name] is a 46 y.o. male  who presented to the ED today with chest pain, hyperglycemia.  EKG shows some ST elevation in the inferior leads although this is unchanged from prior.  Chest pain is atypical, nonexertional and somewhat positional.  I suspect is more likely muscle skeletal.  Chest x-ray is unremarkable.  I have low suspicion for dissection, PE based on history and exam.  Troponin here is negative x 2, low suspicion for ACS.  I suspect his dizziness is related to his hyperglycemia.  He is given IV fluids, insulin here with improvement.  He has normal bicarb and anion gap I have low suspicion for DKA.  CBC is unremarkable.  Will refill his insulin prescription and have him follow-up with his PCP.  Patient is comfortable this plan.  Return precautions given.   Patient placed on continuous vitals and telemetry monitoring while in ED which was reviewed periodically.  Reviewed and confirmed nursing documentation for past medical history, family history, social history.   Patient's presentation is most consistent with acute complicated illness / injury requiring diagnostic workup.           Final Clinical Impression(s) / ED Diagnoses Final diagnoses:  Hyperglycemia  Chest pain, unspecified type    Rx / DC Orders ED Discharge Orders          Ordered    Insulin Glargine (BASAGLAR KWIKPEN) 100 UNIT/ML  Daily        08/13/23 2313    insulin aspart (NOVOLOG FLEXPEN) 100 UNIT/ML FlexPen  2 times daily       Note to Pharmacy: Must have office visit for refills   08/13/23 2313              Laurence Spates, MD 08/13/23 2350

## 2023-08-13 NOTE — ED Notes (Signed)
Pt has a refill at pharm but pharm dosent have med in stock. Pt states he will go to ED since he wants insulin injection now.

## 2023-08-13 NOTE — ED Triage Notes (Signed)
The pt is c/ochest pain and dizziness  since last pm  he has been out of his diabetic med since Friday  some nausea and sob

## 2023-08-14 ENCOUNTER — Other Ambulatory Visit: Payer: Self-pay

## 2023-08-14 ENCOUNTER — Other Ambulatory Visit (HOSPITAL_COMMUNITY): Payer: Self-pay

## 2023-08-30 ENCOUNTER — Ambulatory Visit: Payer: 59 | Attending: Physician Assistant | Admitting: Physician Assistant

## 2023-08-30 ENCOUNTER — Other Ambulatory Visit: Payer: Self-pay

## 2023-08-30 ENCOUNTER — Encounter: Payer: Self-pay | Admitting: Physician Assistant

## 2023-08-30 VITALS — BP 125/84 | HR 91 | Wt 131.0 lb

## 2023-08-30 DIAGNOSIS — Z87898 Personal history of other specified conditions: Secondary | ICD-10-CM

## 2023-08-30 DIAGNOSIS — E1165 Type 2 diabetes mellitus with hyperglycemia: Secondary | ICD-10-CM

## 2023-08-30 DIAGNOSIS — Z794 Long term (current) use of insulin: Secondary | ICD-10-CM | POA: Diagnosis not present

## 2023-08-30 DIAGNOSIS — E785 Hyperlipidemia, unspecified: Secondary | ICD-10-CM

## 2023-08-30 DIAGNOSIS — Z09 Encounter for follow-up examination after completed treatment for conditions other than malignant neoplasm: Secondary | ICD-10-CM

## 2023-08-30 DIAGNOSIS — I4891 Unspecified atrial fibrillation: Secondary | ICD-10-CM

## 2023-08-30 LAB — GLUCOSE, POCT (MANUAL RESULT ENTRY): POC Glucose: 93 mg/dL (ref 70–99)

## 2023-08-30 MED ORDER — NOVOLOG FLEXPEN 100 UNIT/ML ~~LOC~~ SOPN
6.0000 [IU] | PEN_INJECTOR | Freq: Two times a day (BID) | SUBCUTANEOUS | 4 refills | Status: DC
  Filled 2023-08-30 – 2023-11-21 (×3): qty 3, 25d supply, fill #0

## 2023-08-30 NOTE — Patient Instructions (Signed)
Check blood sugars fasting and bedtime and record.  Drink at least 80 ounces water daily

## 2023-08-30 NOTE — Progress Notes (Signed)
Patient ID: [name], male   DOB: 1976/02/24, 47 y.o.   MRN: [id number]       [name], is a 47 y.o. male  XLK:[id number]  [id number]  DOB - 16-Jul-1976  Chief Complaint  Patient presents with   Hospitalization Follow-up   Medication Refill       Subjective:   [name] is a 47 y.o. male here today for an ED follow up visit and needs to be assigned a new PCP since Dr Delford Field retired.  He is doing well and need RF of his novolog.  He is taking 6 units with meals.  He has RF on basal insulin and takes 20-24 units daily.  Blood sugars over the last month have ranged from 150-250.  Usu 180-low 200s.  He has not had further CP.  EKG showed no acute changes but showed possible old inferior infarct.  He does have a h/o afib.    ED A/P For CP 08/13/23: Chest Pain 50 old male history of type 2 diabetes, hyperlipidemia, atrial fibrillation presenting for chest pain and hyperglycemia.  Patient states he ran out of his insulin on Friday has not taken it since.  He takes 20 units long-acting daily and 6 units twice daily with meals for his blood sugar.  He has not any polyuria or polydipsia but has felt somewhat dizzy.  Eating and drinking okay.  No vomiting.  He is also noted some left-sided chest pain.  It is worse with movement and somewhat positional.  Not exertional.  No pleuritic pain.  No radiation to his back or arm.  No headache or neurologic symptoms.  He smokes marijuana but does not smoke otherwise.    Medical Decision Making:   [name] is a 47 y.o. male who presented to the ED today with chest pain, hyperglycemia.  EKG shows some ST elevation in the inferior leads although this is unchanged from prior.  Chest pain is atypical, nonexertional and somewhat positional.  I suspect is more likely muscle skeletal.  Chest x-ray is unremarkable.  I have low suspicion for dissection, PE based on history and exam.  Troponin here is negative x 2, low suspicion for ACS.  I  suspect his dizziness is related to his hyperglycemia.  He is given IV fluids, insulin here with improvement.  He has normal bicarb and anion gap I have low suspicion for DKA.  CBC is unremarkable.  Will refill his insulin prescription and have him follow-up with his PCP.  Patient is comfortable this plan.  Return precautions given.     Patient placed on continuous vitals and telemetry monitoring while in ED which was reviewed periodically.  Reviewed and confirmed nursing documentation for past medical history, family history, social history.     Patient's presentation is most consistent with acute complicated illness / injury requiring diagnostic workup.  No problems updated.  ALLERGIES: No Known Allergies  PAST MEDICAL HISTORY: Past Medical History:  Diagnosis Date   AKI (acute kidney injury) (HCC) 03/21/2018   Diabetes mellitus    Newly diagnosed, May 2012    MEDICATIONS AT HOME: Prior to Admission medications   Medication Sig Start Date End Date Taking? Authorizing Provider  Insulin Glargine (BASAGLAR KWIKPEN) 100 UNIT/ML Inject 20 Units into the skin daily. 08/13/23  Yes Laurence Spates, MD  Insulin Pen Needle (TRUEPLUS 5-BEVEL PEN NEEDLES) 31G X 5 MM MISC Use to inject insulin 2 (two) times daily. 02/27/23  Yes Horton, Clabe Seal, DO  rosuvastatin (CRESTOR) 20 MG tablet Take 1 tablet (20 mg total) by mouth daily. To lower cholesterol 07/31/23  Yes Claiborne Rigg, NP  diltiazem (CARDIZEM CD) 120 MG 24 hr capsule Take 1 capsule (120 mg total) by mouth daily. 01/09/23 07/31/23  Pokhrel, Rebekah [place], MD  insulin aspart (NOVOLOG FLEXPEN) 100 UNIT/ML FlexPen Inject 6 Units into the skin 2 (two) times daily. 08/30/23   Earla Charlie, Marzella Schlein, PA-C    ROS: Neg HEENT Neg resp Neg cardiac Neg GI Neg GU Neg MS Neg psych Neg neuro  Objective:   Vitals:   08/30/23 0916  BP: 125/84  Pulse: 91  SpO2: 100%  Weight: 131 lb (59.4 kg)   Exam General appearance : Awake, alert, not in any  distress. Speech Clear. Not toxic looking, thin HEENT: Atraumatic and Normocephalic Neck: Supple, no JVD. No cervical lymphadenopathy.  Chest: Good air entry bilaterally, CTAB.  No rales/rhonchi/wheezing CVS: S1 S2 regular, no murmurs.  Extremities: B/L Lower Ext shows no edema, both legs are warm to touch Neurology: Awake alert, and oriented X 3, CN II-XII intact, Non focal Skin: No Rash  Data Review Lab Results  Component Value Date   HGBA1C 12.3 (A) 07/31/2023   HGBA1C 11.3 (H) 01/06/2023   HGBA1C 10.6 (A) 04/12/2022    Assessment & Plan   1. Type 2 diabetes mellitus with hyperglycemia, with long-term current use of insulin (HCC) Historically uncontrolled and poor compliance.  Continue basaglar - Glucose (CBG) - insulin aspart (NOVOLOG FLEXPEN) 100 UNIT/ML FlexPen; Inject 6 Units into the skin 2 (two) times daily.  Dispense: 15 mL; Refill: 4  2. Atrial fibrillation with RVR (HCC) On diltiazem but has not seen cardiology that I am able to find within the last 2 years - Ambulatory referral to Cardiology  3. Hyperlipidemia LDL goal <70 Continue rosuvastain  4. Encounter for examination following treatment at hospital - insulin aspart (NOVOLOG FLEXPEN) 100 UNIT/ML FlexPen; Inject 6 Units into the skin 2 (two) times daily.  Dispense: 15 mL; Refill: 4 - Ambulatory referral to Cardiology  5. H/O chest pain Although no current CP- In the setting of uncontrolled diabetes, and h/o afib, will refer to cardiology - insulin aspart (NOVOLOG FLEXPEN) 100 UNIT/ML FlexPen; Inject 6 Units into the skin 2 (two) times daily.  Dispense: 15 mL; Refill: 4 - Ambulatory referral to Cardiology    Return in about 2 months (around 10/31/2023) for PCP for chronic conditions(please assign him to someone).  The patient was given clear instructions to go to ER or return to medical center if symptoms don't improve, worsen or new problems develop. The patient verbalized understanding. The patient was  told to call to get lab results if they haven't heard anything in the next week.      Georgian Co, PA-C [facility] [place], [place] [phone]   08/30/2023, 9:39 AM

## 2023-09-11 ENCOUNTER — Other Ambulatory Visit: Payer: Self-pay

## 2023-09-12 ENCOUNTER — Other Ambulatory Visit (HOSPITAL_BASED_OUTPATIENT_CLINIC_OR_DEPARTMENT_OTHER): Payer: Self-pay

## 2023-09-12 ENCOUNTER — Other Ambulatory Visit: Payer: Self-pay | Admitting: Critical Care Medicine

## 2023-09-12 ENCOUNTER — Other Ambulatory Visit: Payer: Self-pay

## 2023-09-14 ENCOUNTER — Other Ambulatory Visit: Payer: Self-pay

## 2023-09-14 MED ORDER — TRUEPLUS 5-BEVEL PEN NEEDLES 31G X 5 MM MISC
3 refills | Status: AC
  Filled 2023-09-14 – 2024-02-23 (×2): qty 100, 30d supply, fill #0

## 2023-09-26 ENCOUNTER — Other Ambulatory Visit: Payer: Self-pay

## 2023-10-09 ENCOUNTER — Telehealth: Payer: Self-pay | Admitting: *Deleted

## 2023-10-09 ENCOUNTER — Ambulatory Visit (AMBULATORY_SURGERY_CENTER): Payer: 59 | Admitting: *Deleted

## 2023-10-09 VITALS — Ht 72.0 in | Wt 140.0 lb

## 2023-10-09 DIAGNOSIS — Z1211 Encounter for screening for malignant neoplasm of colon: Secondary | ICD-10-CM

## 2023-10-09 MED ORDER — SUFLAVE 178.7 G PO SOLR: 1.0000 | ORAL | 0 refills | Status: AC

## 2023-10-09 NOTE — Telephone Encounter (Signed)
Pt scheduled to have in person visit. No show 5 min so attempt to reach pt. VM not set up. Will attempt in 5 min via phone.   2nd reached

## 2023-10-09 NOTE — Progress Notes (Signed)
Pt's name and DOB verified at the beginning of the pre-visit wit 2 identifiers  Pt denies any difficulty with ambulating,sitting, laying down or rolling side to side  Pt has no issues with ambulation   Pt has no issues moving head neck or swallowing  No egg or soy allergy known to patient   No issues known to pt with past sedation with any surgeries or procedures  Pt denies having issues being intubated  No FH of Malignant Hyperthermia  Pt is not on diet pills or shots  Pt is not on home 02   Pt is not on blood thinners   Pt denies issues with constipation   Pt is not on dialysis  Pt hx of Afub   Pt denies any upcoming cardiac testing  Pt encouraged to use to use Singlecare or Goodrx to reduce cost   Patient's chart reviewed by Cathlyn Parsons CNRA prior to pre-visit and patient appropriate for the [facility].  Pre-visit completed and red dot placed by patient's name on their procedure day (on provider's schedule).  .  Visit by phone  Pt states weight is 140 lb  Instructed pt why it is important to and  to call if they have any changes in health or new medications. Directed them to the # given and on instructions.     Instructions reviewed. Pt given both [facility] main # and MD on call # prior to instructions.  Pt states understanding. Instructed to review again prior to procedure. Pt states they will.   Instructions sent by mail with coupon and by My Chart  Coupon sent via text to mobile phone and pt verified they received it Instructed Marijuana day before and day of. Pt states he will

## 2023-10-17 ENCOUNTER — Telehealth: Payer: Self-pay

## 2023-10-17 NOTE — Telephone Encounter (Signed)
Attempted to reach patient- unable to speak with patient, unable to leave a message concerning need for patient to contact [facility] in order for patient to get prep in time for procedure;  Will attempt to reach patient again at a later time;

## 2023-10-18 ENCOUNTER — Other Ambulatory Visit: Payer: Self-pay

## 2023-10-18 NOTE — Telephone Encounter (Signed)
Attempted to reach patient-unable to speak with patient and not able to leave a message for patient to call [facility];  will attempt to reach patient at a later time;

## 2023-10-20 NOTE — Telephone Encounter (Signed)
Multiple attempts have been made in order to reach the patient; failed to reach patient; procedure at [facility] cancelled and patient will need to call back to the office to reschedule appt;  Patient does not currently have MyChart; a letter has been sent to the patient concerning this matter.

## 2023-10-23 ENCOUNTER — Encounter: Payer: 59 | Admitting: Gastroenterology

## 2023-10-31 ENCOUNTER — Ambulatory Visit: Payer: 59 | Admitting: Nurse Practitioner

## 2023-11-21 ENCOUNTER — Telehealth: Payer: Self-pay

## 2023-11-21 ENCOUNTER — Other Ambulatory Visit (HOSPITAL_COMMUNITY): Payer: Self-pay

## 2023-11-21 ENCOUNTER — Other Ambulatory Visit: Payer: Self-pay

## 2023-11-21 ENCOUNTER — Other Ambulatory Visit: Payer: Self-pay | Admitting: Critical Care Medicine

## 2023-11-21 DIAGNOSIS — Z87898 Personal history of other specified conditions: Secondary | ICD-10-CM

## 2023-11-21 DIAGNOSIS — Z794 Long term (current) use of insulin: Secondary | ICD-10-CM

## 2023-11-21 DIAGNOSIS — Z09 Encounter for follow-up examination after completed treatment for conditions other than malignant neoplasm: Secondary | ICD-10-CM

## 2023-11-21 MED ORDER — BASAGLAR KWIKPEN 100 UNIT/ML ~~LOC~~ SOPN
20.0000 [IU] | PEN_INJECTOR | Freq: Every day | SUBCUTANEOUS | 1 refills | Status: DC
  Filled 2023-11-21: qty 15, 75d supply, fill #0
  Filled 2023-11-22: qty 6, 30d supply, fill #0
  Filled 2023-12-15 – 2023-12-21 (×4): qty 6, 30d supply, fill #1
  Filled 2024-01-24: qty 6, 30d supply, fill #2
  Filled 2024-01-24: qty 6, 30d supply, fill #0
  Filled 2024-02-23: qty 6, 30d supply, fill #1
  Filled 2024-02-23: qty 6, 30d supply, fill #0

## 2023-11-21 MED ORDER — NOVOLOG FLEXPEN 100 UNIT/ML ~~LOC~~ SOPN
6.0000 [IU] | PEN_INJECTOR | Freq: Two times a day (BID) | SUBCUTANEOUS | 4 refills | Status: AC
  Filled 2023-11-21: qty 15, 125d supply, fill #0
  Filled 2023-11-22: qty 3, 25d supply, fill #0
  Filled 2023-12-15 – 2023-12-21 (×2): qty 3, 25d supply, fill #1
  Filled 2024-01-24: qty 3, 25d supply, fill #2
  Filled 2024-01-24 – 2024-02-23 (×2): qty 3, 25d supply, fill #0
  Filled 2024-02-23: qty 3, 25d supply, fill #1

## 2023-11-21 NOTE — Telephone Encounter (Signed)
 Copied from CRM [id number]. Topic: Clinical - Medication Refill >> Nov 21, 2023 10:06 AM Gery Pray wrote: Most Recent Primary Care Visit:  Provider: Anders Simmonds  Department: [facility]  Visit Type: OFFICE VISIT  Date: 08/30/2023  Medication: Insulin Glargine (BASAGLAR KWIKPEN) 100 UNIT/ML, insulin aspart (NOVOLOG FLEXPEN) 100 UNIT/ML FlexPen  Has the patient contacted their pharmacy? Yes (Agent: If no, request that the patient contact the pharmacy for the refill. If patient does not wish to contact the pharmacy document the reason why and proceed with request.) (Agent: If yes, when and what did the pharmacy advise?)  Is this the correct pharmacy for this prescription? Yes If no, delete pharmacy and type the correct one.  This is the patient's preferred pharmacy:  [facility] [address] [place] [address] Phone: [phone] Fax: [phone]  Has the prescription been filled recently? No  Is the patient out of the medication? Yes  Has the patient been seen for an appointment in the last year OR does the patient have an upcoming appointment? Yes  Can we respond through MyChart? No  Agent: Please be advised that Rx refills may take up to 3 business days. We ask that you follow-up with your pharmacy.

## 2023-11-21 NOTE — Telephone Encounter (Signed)
 Copied from CRM [id number]. Topic: Clinical - Medication Refill >> Nov 21, 2023  9:57 AM Gery Pray wrote: Most Recent Primary Care Visit:  Provider: Anders Simmonds  [facility]  Visit Type: OFFICE VISIT  Date: 08/30/2023  Medication: insulin aspart (NOVOLOG FLEXPEN) 100 UNIT/ML FlexPen, Insulin Glargine (BASAGLAR KWIKPEN) 100 UNIT/ML  Has the patient contacted their pharmacy? Yes (Agent: If no, request that the patient contact the pharmacy for the refill. If patient does not wish to contact the pharmacy document the reason why and proceed with request.) (Agent: If yes, when and what did the pharmacy advise?)  Is this the correct pharmacy for this prescription? Yes If no, delete pharmacy and type the correct one.  This is the patient's preferred pharmacy:  [facility] [address] [place] [address] Phone: [phone] Fax: [phone]  Has the prescription been filled recently? No  Is the patient out of the medication? Yes  Has the patient been seen for an appointment in the last year OR does the patient have an upcoming appointment? Yes  Can we respond through MyChart? No  Agent: Please be advised that Rx refills may take up to 3 business days. We ask that you follow-up with your pharmacy.

## 2023-11-21 NOTE — Telephone Encounter (Signed)
 Medication has been refilled.

## 2023-11-22 ENCOUNTER — Other Ambulatory Visit: Payer: Self-pay

## 2023-11-23 ENCOUNTER — Other Ambulatory Visit (HOSPITAL_COMMUNITY): Payer: Self-pay

## 2023-12-04 ENCOUNTER — Ambulatory Visit: Payer: 59 | Admitting: Nurse Practitioner

## 2023-12-11 ENCOUNTER — Ambulatory Visit: Payer: 59 | Attending: Cardiology | Admitting: Cardiology

## 2023-12-15 ENCOUNTER — Other Ambulatory Visit (HOSPITAL_BASED_OUTPATIENT_CLINIC_OR_DEPARTMENT_OTHER): Payer: Self-pay

## 2023-12-16 ENCOUNTER — Other Ambulatory Visit (HOSPITAL_BASED_OUTPATIENT_CLINIC_OR_DEPARTMENT_OTHER): Payer: Self-pay

## 2023-12-18 ENCOUNTER — Other Ambulatory Visit: Payer: Self-pay

## 2023-12-18 ENCOUNTER — Other Ambulatory Visit (HOSPITAL_BASED_OUTPATIENT_CLINIC_OR_DEPARTMENT_OTHER): Payer: Self-pay

## 2023-12-21 ENCOUNTER — Other Ambulatory Visit: Payer: Self-pay

## 2023-12-21 ENCOUNTER — Other Ambulatory Visit (HOSPITAL_BASED_OUTPATIENT_CLINIC_OR_DEPARTMENT_OTHER): Payer: Self-pay

## 2024-01-24 ENCOUNTER — Other Ambulatory Visit (HOSPITAL_COMMUNITY): Payer: Self-pay

## 2024-01-24 ENCOUNTER — Other Ambulatory Visit (HOSPITAL_BASED_OUTPATIENT_CLINIC_OR_DEPARTMENT_OTHER): Payer: Self-pay

## 2024-01-24 ENCOUNTER — Other Ambulatory Visit: Payer: Self-pay

## 2024-02-23 ENCOUNTER — Other Ambulatory Visit: Payer: Self-pay

## 2024-02-23 ENCOUNTER — Other Ambulatory Visit (HOSPITAL_BASED_OUTPATIENT_CLINIC_OR_DEPARTMENT_OTHER): Payer: Self-pay

## 2024-03-05 ENCOUNTER — Other Ambulatory Visit: Payer: Self-pay

## 2024-03-06 ENCOUNTER — Other Ambulatory Visit: Payer: Self-pay

## 2024-03-20 ENCOUNTER — Other Ambulatory Visit: Payer: Self-pay

## 2024-03-26 ENCOUNTER — Other Ambulatory Visit: Payer: Self-pay | Admitting: Critical Care Medicine

## 2024-03-26 DIAGNOSIS — Z09 Encounter for follow-up examination after completed treatment for conditions other than malignant neoplasm: Secondary | ICD-10-CM

## 2024-03-26 DIAGNOSIS — Z794 Long term (current) use of insulin: Secondary | ICD-10-CM

## 2024-03-26 DIAGNOSIS — Z87898 Personal history of other specified conditions: Secondary | ICD-10-CM

## 2024-03-26 NOTE — Telephone Encounter (Unsigned)
 Copied from CRM [id number]. Topic: Clinical - Medication Refill >> Mar 26, 2024 10:46 AM Elle L wrote: Medication: insulin  aspart (NOVOLOG  FLEXPEN) 100 UNIT/ML FlexPen  Has the patient contacted their pharmacy? Yes  [id number] [place],  - [address] [place] [address] Phone: [phone] Fax: [phone]  Is this the correct pharmacy for this prescription? Yes  Has the prescription been filled recently? Yes  Is the patient out of the medication? Yes  Has the patient been seen for an appointment in the last year OR does the patient have an upcoming appointment? Yes  Can we respond through MyChart? Yes  Agent: Please be advised that Rx refills may take up to 3 business days. We ask that you follow-up with your pharmacy.

## 2024-03-28 MED ORDER — BASAGLAR KWIKPEN 100 UNIT/ML ~~LOC~~ SOPN: 20.0000 [IU] | PEN_INJECTOR | Freq: Every day | SUBCUTANEOUS | 0 refills | Status: DC

## 2024-03-28 NOTE — Telephone Encounter (Signed)
 Called pharmacy as pt should have several refills of this medication. Per pharmacy pt needs Basaglar  refilled.

## 2024-03-28 NOTE — Telephone Encounter (Signed)
 Courtesy refill. Patient will need an office visit for additional refills.  Requested Prescriptions  Pending Prescriptions Disp Refills   Insulin  Glargine (BASAGLAR  KWIKPEN) 100 UNIT/ML 6 mL 0    Sig: Inject 20 Units into the skin daily.     Endocrinology:  Diabetes - Insulins Failed - 03/28/2024  1:43 PM      Failed - HBA1C is between 0 and 7.9 and within 180 days    Hemoglobin A1C  Date Value Ref Range Status  07/31/2023 12.3 (A) 4.0 - 5.6 % Final   HbA1c, POC (prediabetic range)  Date Value Ref Range Status  07/12/2018 12.2 (A) 5.7 - 6.4 % Final   HbA1c, POC (controlled diabetic range)  Date Value Ref Range Status  04/12/2022 10.6 (A) 0.0 - 7.0 % Final   Hgb A1c MFr Bld  Date Value Ref Range Status  01/06/2023 11.3 (H) 4.8 - 5.6 % Final    Comment:    (NOTE) Pre diabetes:          5.7%-6.4%  Diabetes:              >6.4%  Glycemic control for   <7.0% adults with diabetes          Failed - Valid encounter within last 6 months    Recent Outpatient Visits           7 months ago Type 2 diabetes mellitus with hyperglycemia, with long-term current use of insulin  (HCC)   [facility] [place]. [facility], [place] M, [place]   8 months ago Type 2 diabetes mellitus with hyperglycemia, with long-term current use of insulin  [facility])   [facility] [place]. [facility] [place], [place] W, NP   1 year ago Type 2 diabetes mellitus with hyperglycemia, with long-term current use of insulin  [facility])   [facility] [place]. [facility] Brien Belvie BRAVO, MD   2 years ago Type 2 diabetes mellitus with hyperglycemia, with long-term current use of insulin  [facility])   [facility] Of [place]. [facility] Fleeta Morris, [place] L, RPH-CPP   2 years ago Type 2 diabetes mellitus with hyperglycemia, with long-term current use of insulin  [facility])    [facility] Of [place]. [facility] Fleeta Morris Garnette LITTIE, RPH-CPP

## 2024-06-03 ENCOUNTER — Other Ambulatory Visit: Payer: Self-pay

## 2024-06-03 ENCOUNTER — Other Ambulatory Visit: Payer: Self-pay | Admitting: Critical Care Medicine

## 2024-06-03 DIAGNOSIS — E1165 Type 2 diabetes mellitus with hyperglycemia: Secondary | ICD-10-CM

## 2024-06-04 ENCOUNTER — Other Ambulatory Visit: Payer: Self-pay

## 2024-06-04 MED ORDER — BASAGLAR KWIKPEN 100 UNIT/ML ~~LOC~~ SOPN
20.0000 [IU] | PEN_INJECTOR | Freq: Every day | SUBCUTANEOUS | 0 refills | Status: DC
  Filled 2024-06-04: qty 6, 30d supply, fill #0

## 2024-06-12 ENCOUNTER — Other Ambulatory Visit: Payer: Self-pay

## 2024-06-12 NOTE — Progress Notes (Signed)
 [name]                                          MRN: [id number]   06/12/2024   The VBCI Quality Team Specialist reviewed this patient medical record for the purposes of chart review for care gap closure. The following were reviewed: chart review for care gap closure-colorectal cancer screening and diabetic eye exam.    VBCI Quality Team

## 2024-06-13 ENCOUNTER — Other Ambulatory Visit: Payer: Self-pay

## 2024-06-26 ENCOUNTER — Other Ambulatory Visit: Payer: Self-pay

## 2024-07-01 ENCOUNTER — Ambulatory Visit: Admitting: Nurse Practitioner

## 2024-07-12 ENCOUNTER — Telehealth: Payer: Self-pay

## 2024-07-12 NOTE — Telephone Encounter (Signed)
 Called patient number on file twice unable to make contact or leave voicemail,  called to schedule appt with [place] 10/31

## 2024-08-12 ENCOUNTER — Other Ambulatory Visit (HOSPITAL_BASED_OUTPATIENT_CLINIC_OR_DEPARTMENT_OTHER): Payer: Self-pay

## 2024-08-23 ENCOUNTER — Other Ambulatory Visit: Payer: Self-pay | Admitting: Nurse Practitioner

## 2024-08-23 ENCOUNTER — Other Ambulatory Visit: Payer: Self-pay

## 2024-08-23 ENCOUNTER — Other Ambulatory Visit: Payer: Self-pay | Admitting: Family Medicine

## 2024-08-23 DIAGNOSIS — E1165 Type 2 diabetes mellitus with hyperglycemia: Secondary | ICD-10-CM

## 2024-08-23 NOTE — Telephone Encounter (Signed)
 Copied from CRM #[id number]. Topic: Clinical - Medication Refill >> Aug 23, 2024  2:13 PM Pinkey ORN wrote: Medication: Insulin  Glargine (BASAGLAR  KWIKPEN) 100 UNIT/ML  Has the patient contacted their pharmacy? Yes (Agent: If no, request that the patient contact the pharmacy for the refill. If patient does not wish to contact the pharmacy document the reason why and proceed with request.) (Agent: If yes, when and what did the pharmacy advise?)  This is the patient's preferred pharmacy:   [facility] [address], [place] - [address] [place] [address] Phone: [phone] Fax: [phone]  Is this the correct pharmacy for this prescription? Yes If no, delete pharmacy and type the correct one.   Has the prescription been filled recently? No  Is the patient out of the medication? No  Has the patient been seen for an appointment in the last year OR does the patient have an upcoming appointment? Yes  Can we respond through MyChart? No  Agent: Please be advised that Rx refills may take up to 3 business days. We ask that you follow-up with your pharmacy.

## 2024-08-26 NOTE — Telephone Encounter (Signed)
 Requested medication (s) are due for refill today- yes  Requested medication (s) are on the active medication list -yes  Future visit scheduled -yes  Last refill: 06/04/24 6ml  Notes to clinic: last refill has notes- must have OV for refills- sent for review   Requested Prescriptions  Pending Prescriptions Disp Refills   Insulin  Glargine (BASAGLAR  KWIKPEN) 100 UNIT/ML 6 mL 0    Sig: Inject 20 Units into the skin daily.     Endocrinology:  Diabetes - Insulins Failed - 08/26/2024  4:23 PM      Failed - HBA1C is between 0 and 7.9 and within 180 days    Hemoglobin A1C  Date Value Ref Range Status  07/31/2023 12.3 (A) 4.0 - 5.6 % Final   HbA1c, POC (prediabetic range)  Date Value Ref Range Status  07/12/2018 12.2 (A) 5.7 - 6.4 % Final   HbA1c, POC (controlled diabetic range)  Date Value Ref Range Status  04/12/2022 10.6 (A) 0.0 - 7.0 % Final   Hgb A1c MFr Bld  Date Value Ref Range Status  01/06/2023 11.3 (H) 4.8 - 5.6 % Final    Comment:    (NOTE) Pre diabetes:          5.7%-6.4%  Diabetes:              >6.4%  Glycemic control for   <7.0% adults with diabetes          Failed - Valid encounter within last 6 months    Recent Outpatient Visits           12 months ago Type 2 diabetes mellitus with hyperglycemia, with long-term current use of insulin  (HCC)   [facility] [place]. [facility] [place], [place], [place]   1 year ago Type 2 diabetes mellitus with hyperglycemia, with long-term current use of insulin  [facility])   [facility] [place]. [facility] [place], [place] W, NP   2 years ago Type 2 diabetes mellitus with hyperglycemia, with long-term current use of insulin  [facility])   [facility] [place]. [facility] Brien Belvie BRAVO, MD   2 years ago Type 2 diabetes mellitus with hyperglycemia, with long-term current use of insulin  [facility])   [facility] Of [place]. [facility] Fleeta Morris, [place] L, RPH-CPP   2 years ago Type 2 diabetes mellitus with hyperglycemia, with long-term current use of insulin  [facility])   [facility] Of [place]. [facility] Fleeta Morris Garnette LITTIE, RPH-CPP                 Requested Prescriptions  Pending Prescriptions Disp Refills   Insulin  Glargine (BASAGLAR  KWIKPEN) 100 UNIT/ML 6 mL 0    Sig: Inject 20 Units into the skin daily.     Endocrinology:  Diabetes - Insulins Failed - 08/26/2024  4:23 PM      Failed - HBA1C is between 0 and 7.9 and within 180 days    Hemoglobin A1C  Date Value Ref Range Status  07/31/2023 12.3 (A) 4.0 - 5.6 % Final   HbA1c, POC (prediabetic range)  Date Value Ref Range Status  07/12/2018 12.2 (A) 5.7 - 6.4 % Final   HbA1c, POC (controlled diabetic range)  Date Value Ref Range Status  04/12/2022 10.6 (A) 0.0 -  7.0 % Final   Hgb A1c MFr Bld  Date Value Ref Range Status  01/06/2023 11.3 (H) 4.8 - 5.6 % Final    Comment:    (NOTE) Pre diabetes:          5.7%-6.4%  Diabetes:              >6.4%  Glycemic control for   <7.0% adults with diabetes          Failed - Valid encounter within last 6 months    Recent Outpatient Visits           12 months ago Type 2 diabetes mellitus with hyperglycemia, with long-term current use of insulin  (HCC)   [facility] [facility] [place]. [facility] [place], [place], [place]   1 year ago Type 2 diabetes mellitus with hyperglycemia, with long-term current use of insulin  [facility])   [facility] [place]. [facility] [place], [place] W, NP   2 years ago Type 2 diabetes mellitus with hyperglycemia, with long-term current use of insulin  [facility])   [facility] [place]. [facility] Brien Belvie BRAVO, MD   2 years ago Type 2 diabetes mellitus with hyperglycemia,  with long-term current use of insulin  [facility])   [facility] Of [place]. [facility] Fleeta Morris, [place] L, RPH-CPP   2 years ago Type 2 diabetes mellitus with hyperglycemia, with long-term current use of insulin  [facility])   [facility] [facility] Dept Of [place]. [facility] Fleeta Morris Garnette LITTIE, RPH-CPP

## 2024-08-27 ENCOUNTER — Other Ambulatory Visit: Payer: Self-pay | Admitting: Pharmacist

## 2024-08-27 DIAGNOSIS — Z794 Long term (current) use of insulin: Secondary | ICD-10-CM

## 2024-08-27 MED ORDER — BASAGLAR KWIKPEN 100 UNIT/ML ~~LOC~~ SOPN: 20.0000 [IU] | PEN_INJECTOR | Freq: Every day | SUBCUTANEOUS | 0 refills | Status: AC

## 2024-08-27 NOTE — Telephone Encounter (Unsigned)
 Copied from CRM #[id number]. Topic: Clinical - Medication Refill >> Aug 27, 2024 11:16 AM Corin V wrote: Patient called to follow up on refill request. He is down to the last shot. He is already scheduled for January for his follow up appointment.

## 2024-09-30 ENCOUNTER — Telehealth: Payer: Self-pay | Admitting: Critical Care Medicine

## 2024-09-30 NOTE — Telephone Encounter (Signed)
 Pt confirmed appt

## 2024-10-01 ENCOUNTER — Ambulatory Visit: Admitting: Nurse Practitioner
